# Patient Record
Sex: Female | Born: 1975
Health system: Southern US, Community
[De-identification: ages and names within clinical notes are randomized; demographics above are authoritative.]

## PROBLEM LIST (undated history)

## (undated) ENCOUNTER — Emergency Department (HOSPITAL_COMMUNITY): Admission: EM | Payer: Self-pay | Source: Home / Self Care

## (undated) DIAGNOSIS — O24419 Gestational diabetes mellitus in pregnancy, unspecified control: Secondary | ICD-10-CM

## (undated) DIAGNOSIS — R002 Palpitations: Secondary | ICD-10-CM

## (undated) DIAGNOSIS — J45909 Unspecified asthma, uncomplicated: Secondary | ICD-10-CM

## (undated) DIAGNOSIS — D649 Anemia, unspecified: Secondary | ICD-10-CM

## (undated) DIAGNOSIS — F32A Depression, unspecified: Secondary | ICD-10-CM

## (undated) DIAGNOSIS — T8859XA Other complications of anesthesia, initial encounter: Secondary | ICD-10-CM

## (undated) DIAGNOSIS — J302 Other seasonal allergic rhinitis: Secondary | ICD-10-CM

## (undated) DIAGNOSIS — O1490 Unspecified pre-eclampsia, unspecified trimester: Secondary | ICD-10-CM

## (undated) DIAGNOSIS — R011 Cardiac murmur, unspecified: Secondary | ICD-10-CM

## (undated) DIAGNOSIS — N979 Female infertility, unspecified: Secondary | ICD-10-CM

## (undated) DIAGNOSIS — D219 Benign neoplasm of connective and other soft tissue, unspecified: Secondary | ICD-10-CM

## (undated) DIAGNOSIS — Z8632 Personal history of gestational diabetes: Secondary | ICD-10-CM

## (undated) DIAGNOSIS — O139 Gestational [pregnancy-induced] hypertension without significant proteinuria, unspecified trimester: Secondary | ICD-10-CM

## (undated) HISTORY — DX: Female infertility, unspecified: N97.9

## (undated) HISTORY — DX: Palpitations: R00.2

## (undated) HISTORY — PX: WISDOM TOOTH EXTRACTION: SHX21

## (undated) HISTORY — PX: KNEE SURGERY: SHX244

## (undated) HISTORY — DX: Depression, unspecified: F32.A

---
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
## 1998-07-03 ENCOUNTER — Emergency Department (HOSPITAL_COMMUNITY): Admission: EM | Admit: 1998-07-03 | Discharge: 1998-07-04 | Payer: Self-pay | Admitting: Emergency Medicine

## 1998-10-01 ENCOUNTER — Encounter: Admission: RE | Admit: 1998-10-01 | Discharge: 1998-10-01 | Payer: Self-pay | Admitting: Family Medicine

## 1999-09-14 ENCOUNTER — Emergency Department (HOSPITAL_COMMUNITY): Admission: EM | Admit: 1999-09-14 | Discharge: 1999-09-14 | Payer: Self-pay | Admitting: Emergency Medicine

## 1999-09-22 ENCOUNTER — Encounter: Payer: Self-pay | Admitting: Emergency Medicine

## 1999-09-22 ENCOUNTER — Emergency Department (HOSPITAL_COMMUNITY): Admission: EM | Admit: 1999-09-22 | Discharge: 1999-09-22 | Payer: Self-pay | Admitting: Emergency Medicine

## 2000-07-13 ENCOUNTER — Encounter: Admission: RE | Admit: 2000-07-13 | Discharge: 2000-07-13 | Payer: Self-pay | Admitting: Family Medicine

## 2000-08-25 ENCOUNTER — Encounter: Admission: RE | Admit: 2000-08-25 | Discharge: 2000-08-25 | Payer: Self-pay | Admitting: Family Medicine

## 2001-01-04 ENCOUNTER — Encounter: Payer: Self-pay | Admitting: Emergency Medicine

## 2001-01-04 ENCOUNTER — Emergency Department (HOSPITAL_COMMUNITY): Admission: EM | Admit: 2001-01-04 | Discharge: 2001-01-04 | Payer: Self-pay | Admitting: Emergency Medicine

## 2002-11-28 ENCOUNTER — Encounter: Payer: Self-pay | Admitting: Emergency Medicine

## 2002-11-28 ENCOUNTER — Emergency Department (HOSPITAL_COMMUNITY): Admission: EM | Admit: 2002-11-28 | Discharge: 2002-11-28 | Payer: Self-pay | Admitting: Emergency Medicine

## 2003-12-02 ENCOUNTER — Encounter (INDEPENDENT_AMBULATORY_CARE_PROVIDER_SITE_OTHER): Payer: Self-pay | Admitting: *Deleted

## 2003-12-10 ENCOUNTER — Encounter (INDEPENDENT_AMBULATORY_CARE_PROVIDER_SITE_OTHER): Payer: Self-pay | Admitting: *Deleted

## 2003-12-10 ENCOUNTER — Encounter: Admission: RE | Admit: 2003-12-10 | Discharge: 2003-12-10 | Payer: Self-pay | Admitting: Family Medicine

## 2005-03-04 ENCOUNTER — Ambulatory Visit: Payer: Self-pay | Admitting: Family Medicine

## 2005-08-04 ENCOUNTER — Ambulatory Visit: Payer: Self-pay | Admitting: Family Medicine

## 2005-09-12 ENCOUNTER — Ambulatory Visit: Payer: Self-pay | Admitting: Family Medicine

## 2006-04-07 ENCOUNTER — Ambulatory Visit: Payer: Self-pay | Admitting: Family Medicine

## 2006-08-24 ENCOUNTER — Emergency Department (HOSPITAL_COMMUNITY): Admission: EM | Admit: 2006-08-24 | Discharge: 2006-08-24 | Payer: Self-pay | Admitting: Emergency Medicine

## 2006-09-13 ENCOUNTER — Encounter: Admission: RE | Admit: 2006-09-13 | Discharge: 2006-09-13 | Payer: Self-pay | Admitting: Internal Medicine

## 2006-09-19 ENCOUNTER — Encounter: Admission: RE | Admit: 2006-09-19 | Discharge: 2006-10-19 | Payer: Self-pay | Admitting: Orthopedic Surgery

## 2006-12-28 ENCOUNTER — Ambulatory Visit (HOSPITAL_BASED_OUTPATIENT_CLINIC_OR_DEPARTMENT_OTHER): Admission: RE | Admit: 2006-12-28 | Discharge: 2006-12-28 | Payer: Self-pay | Admitting: Orthopedic Surgery

## 2006-12-29 ENCOUNTER — Encounter (INDEPENDENT_AMBULATORY_CARE_PROVIDER_SITE_OTHER): Payer: Self-pay | Admitting: *Deleted

## 2007-01-09 ENCOUNTER — Encounter: Admission: RE | Admit: 2007-01-09 | Discharge: 2007-03-07 | Payer: Self-pay | Admitting: Orthopedic Surgery

## 2007-08-13 ENCOUNTER — Ambulatory Visit (HOSPITAL_COMMUNITY): Admission: RE | Admit: 2007-08-13 | Discharge: 2007-08-13 | Payer: Self-pay | Admitting: Orthopedic Surgery

## 2008-07-02 ENCOUNTER — Emergency Department (HOSPITAL_COMMUNITY): Admission: EM | Admit: 2008-07-02 | Discharge: 2008-07-02 | Payer: Self-pay | Admitting: Family Medicine

## 2008-08-28 ENCOUNTER — Ambulatory Visit: Payer: Self-pay | Admitting: Family Medicine

## 2008-08-28 DIAGNOSIS — R0609 Other forms of dyspnea: Secondary | ICD-10-CM | POA: Insufficient documentation

## 2008-08-28 DIAGNOSIS — R0989 Other specified symptoms and signs involving the circulatory and respiratory systems: Secondary | ICD-10-CM | POA: Insufficient documentation

## 2008-09-16 ENCOUNTER — Ambulatory Visit: Payer: Self-pay | Admitting: Family Medicine

## 2009-08-21 ENCOUNTER — Encounter: Payer: Self-pay | Admitting: Family Medicine

## 2009-08-21 ENCOUNTER — Ambulatory Visit: Payer: Self-pay | Admitting: Family Medicine

## 2009-08-21 DIAGNOSIS — J309 Allergic rhinitis, unspecified: Secondary | ICD-10-CM | POA: Insufficient documentation

## 2009-08-21 DIAGNOSIS — E669 Obesity, unspecified: Secondary | ICD-10-CM | POA: Insufficient documentation

## 2009-08-26 ENCOUNTER — Encounter: Payer: Self-pay | Admitting: Family Medicine

## 2009-08-31 ENCOUNTER — Telehealth: Payer: Self-pay | Admitting: Family Medicine

## 2009-09-04 ENCOUNTER — Ambulatory Visit: Payer: Self-pay | Admitting: Family Medicine

## 2009-10-15 ENCOUNTER — Ambulatory Visit: Payer: Self-pay | Admitting: Family Medicine

## 2009-10-15 ENCOUNTER — Encounter: Payer: Self-pay | Admitting: Family Medicine

## 2009-10-15 DIAGNOSIS — R87619 Unspecified abnormal cytological findings in specimens from cervix uteri: Secondary | ICD-10-CM | POA: Insufficient documentation

## 2009-10-19 ENCOUNTER — Encounter: Payer: Self-pay | Admitting: Family Medicine

## 2009-11-05 ENCOUNTER — Encounter: Payer: Self-pay | Admitting: Family Medicine

## 2009-11-25 ENCOUNTER — Encounter: Payer: Self-pay | Admitting: Family Medicine

## 2010-01-12 ENCOUNTER — Ambulatory Visit (HOSPITAL_COMMUNITY): Admission: RE | Admit: 2010-01-12 | Discharge: 2010-01-12 | Payer: Self-pay | Admitting: Orthopedic Surgery

## 2010-04-16 ENCOUNTER — Ambulatory Visit (HOSPITAL_BASED_OUTPATIENT_CLINIC_OR_DEPARTMENT_OTHER): Admission: RE | Admit: 2010-04-16 | Discharge: 2010-04-16 | Payer: Self-pay | Admitting: Orthopedic Surgery

## 2010-04-28 ENCOUNTER — Encounter: Payer: Self-pay | Admitting: Family Medicine

## 2010-05-26 ENCOUNTER — Encounter: Payer: Self-pay | Admitting: Family Medicine

## 2010-06-23 ENCOUNTER — Encounter: Payer: Self-pay | Admitting: Family Medicine

## 2010-07-21 ENCOUNTER — Encounter: Payer: Self-pay | Admitting: Family Medicine

## 2010-09-02 ENCOUNTER — Encounter: Payer: Self-pay | Admitting: Family Medicine

## 2010-12-02 NOTE — Consult Note (Signed)
Summary: [facility]   Imported By: Clydell Hakim 05/06/2010 15:32:45  _____________________________________________________________________  External Attachment:    Type:   Image     Comment:   External Document

## 2010-12-02 NOTE — Consult Note (Signed)
Summary: Stacey Carlson   Imported By: De Nurse 09/17/2010 16:56:21  _____________________________________________________________________  External Attachment:    Type:   Image     Comment:   External Document

## 2010-12-02 NOTE — Consult Note (Signed)
Summary: [facility]   Imported By: Bradly Bienenstock 06/02/2010 12:56:40  _____________________________________________________________________  External Attachment:    Type:   Image     Comment:   External Document

## 2010-12-02 NOTE — Consult Note (Signed)
Summary: [facility]   Imported By: Clydell Hakim 11/26/2009 15:43:18  _____________________________________________________________________  External Attachment:    Type:   Image     Comment:   External Document

## 2010-12-02 NOTE — Consult Note (Signed)
[facility]   Imported By: De Nurse 07/13/2010 09:46:02  _____________________________________________________________________  External Attachment:    Type:   Image     Comment:   External Document

## 2010-12-02 NOTE — Consult Note (Signed)
Summary: [facility]   Imported By: Clydell Hakim 11/26/2009 16:04:59  _____________________________________________________________________  External Attachment:    Type:   Image     Comment:   External Document

## 2010-12-02 NOTE — Letter (Signed)
Summary: Repeat Pap 08/2010  [facility]  [address]   [place], [place] [address]   Phone: [phone]  Fax: [phone]    11/05/2009  [facility] [address] [place], [place]  [address]  Dear Ms. MCDOWELL,  This is a follow up to the letter I sent you 2 weeks ago.  While your colposcopy results were normal, because you had atypical glandular cells on your original Pap smear, it is very important that you follow up with a repeat Pap smear in 1 year to ensure that everything is normal.  **  PLEASE SCHEDULE A FOLLOW-UP PAP SMEAR FOR THE FIRST WEEK OF NOVEMBER 2011.  Sincerely, Romero Belling MD  Appended Document: Repeat Pap 08/2010 mailed.

## 2010-12-02 NOTE — Consult Note (Signed)
[facility]   Imported By: Clydell Hakim 07/22/2010 15:05:23  _____________________________________________________________________  External Attachment:    Type:   Image     Comment:   External Document

## 2010-12-15 ENCOUNTER — Encounter: Payer: Self-pay | Admitting: *Deleted

## 2011-03-18 NOTE — Op Note (Signed)
NAME:  Stacey Carlson, Stacey Carlson NO.:  [id number]   MEDICAL RECORD NO.:  [id number]          PATIENT TYPE:  AMB   LOCATION:  [facility]                          FACILITY:  [facility]   PHYSICIAN:  Rodney A. Mortenson, M.D.DATE OF BIRTH:  [birth year]   DATE OF PROCEDURE:  12/28/2006  DATE OF DISCHARGE:                               OPERATIVE REPORT   JUSTIFICATION:  A 35 year old female who fell at work in a hallway on  August 15, 2006.  She struck her right knee.  She has had  some  soreness about the knee since that time.  The pain is felt along the  medial joint line.  Examination shows some tenderness along the medial  joint line, no lateral joint line tenderness, no fluid in the knee.  MRI  of the knee shows no fractures, no effusion, no internal derangement,  some mild chondromalacia of the patella and some edema about the  iliotibial band and otherwise normal.  She was treated conservatively  and never seemed to improve.  On multiple examinations she had a plica  over the medial femoral condyle, which was quite tender.  Manipulating  this caused pain.  The plica was injected, but this was not successful.  Because of persistent pain and discomfort, it was felt that evaluation  of the knee arthroscopically was indicated and necessary.  Complication  discussed preoperatively.  Questions answered and encouraged.  No  guarantee can be given as to the final outcome.  The patient clearly  understands.   PREOPERATIVE DIAGNOSES:  Probable plica, right knee.   POSTOPERATIVE DIAGNOSES:  1. Plica, right knee.  2. Chondral damage, medial femoral condyle right knee (possibly      trauma).   ANESTHESIA:  MAC.   SURGEON:  Lenard Galloway. Chaney Malling, M.D.   PATHOLOGY:  With the arthroscope in the knee, a very careful examination  of the knee was undertaken.  The patellofemoral joint appeared fairly  normal.  There was normal tracking of the patella in the intercondylar  notch, good lateral  compartment was visualized.  Normal articular  cartilage of the lateral femoral condyle.  The lateral tibial plateau  and the entire circumference of the lateral meniscus was normal.  The  Anterior cruciate ligament was normal.  With the arthroscope in the  medial compartment articular cartilage of the weightbearing area of the  medial femoral condyle appeared fairly normal.  Anteriorly over the  medial femoral condyle there was an area of cartilage damage, which  could be related to trauma or even a plica.  Medial meniscus was normal.  The was a small plica present.   PROCEDURE:  The patient was placed on the operating table in supine  position, the pneumatic tourniquet about the right upper thigh.  The  right leg was placed in a leg holder and the entire right lower  extremity prepped with DuraPrep, draped out in the usual manner.  Marcaine placed in the knee and Xylocaine and epinephrine used to  infiltrate the puncture wounds.  An infusion cannula was placed in the  superior medial pouch and knee distended with saline.  Anteromedial and  anterolateral portals were made.  The arthroscope was introduced.  The  findings were described as above.  The only significant pathology was  seen in the medial side of the knee.  A small medial plica was debrided  with a chondroplastic shaver.  There was an area of chondral damage over  the anterior aspect of the medial femoral condyle just above the  anterior horn of the medial meniscus.  This probably was related to  impact trauma but could be related to a plica.  A chondroplastic shaver  was introduced and this area was debrided.  Excellent decompression was  achieved.  There was still good underlying cartilage covering the bone  in the subchondral areas.  The knee was then filled with Marcaine and a  large bulky pressure dressing applied.  The patient returned to the  recovery room in excellent condition.  Technically this procedure went   extremely well.   FOLLOW-UP CARE:  1. To my office on Wednesday.  2. Percocet for pain.  3. Usual postop instructions given.           ______________________________  Lenard Galloway. Chaney Malling, M.D.     RAM/MEDQ  D:  12/28/2006  T:  12/28/2006  Job:  [id number]

## 2012-02-03 ENCOUNTER — Encounter: Payer: Self-pay | Admitting: Family Medicine

## 2012-02-03 ENCOUNTER — Ambulatory Visit (INDEPENDENT_AMBULATORY_CARE_PROVIDER_SITE_OTHER): Payer: 59 | Admitting: Family Medicine

## 2012-02-03 VITALS — BP 135/80 | HR 76 | Temp 97.6°F | Ht 63.0 in | Wt 190.0 lb

## 2012-02-03 DIAGNOSIS — R002 Palpitations: Secondary | ICD-10-CM | POA: Insufficient documentation

## 2012-02-03 DIAGNOSIS — I4949 Other premature depolarization: Secondary | ICD-10-CM

## 2012-02-03 DIAGNOSIS — I493 Ventricular premature depolarization: Secondary | ICD-10-CM | POA: Insufficient documentation

## 2012-02-03 NOTE — Patient Instructions (Signed)
Premature Ventricular Contraction  Premature ventricular contraction (PVC) is an irregularity of the heart rhythm involving extra or skipped heartbeats. In some cases, they may occur without obvious cause or heart disease.  Other times, they can be caused by an electrolyte change in the blood. These need to be corrected. They can also be seen when there is not enough oxygen going to the heart. A common cause of this is plaque or cholesterol buildup. This buildup decreases the blood supply to the heart. In addition, extra beats may be caused or aggravated by:   Excessive smoking.   Alcohol consumption.   Caffeine.   Certain medications   Some street drugs.  SYMPTOMS     The sensation of feeling your heart skipping a beat (palpitations).   In many cases, the person may have no symptoms.  SIGNS AND TESTS     A physical examination may show an occasional irregularity, but if the PVC beats do not happen often, they may not be found on physical exam.   Blood pressure is usually normal.   Other tests that may find extra beats of the heart are:   An EKG (electrocardiogram)   A Holter monitor which can monitor your heart over longer periods of time   An Angiogram (study of the heart arteries).  TREATMENT    Usually extra heartbeats do not need treatment. The condition is treated only if symptoms are severe or if extra beats are very frequent or are causing problems. An underlying cause, if discovered, may also require treatment.    Treatment may also be needed if there may be a risk for other more serious cardiac arrhythmias.    PREVENTION     Moderation in caffeine, alcohol, and tobacco use may reduce the risk of ectopic heartbeats in some people.   Exercise often helps people who lead a sedentary (inactive) lifestyle.  PROGNOSIS    PVC heartbeats are generally harmless and do not need treatment.    RISKS AND COMPLICATIONS     Ventricular tachycardia (occasionally).   There usually are no complications.    Other arrhythmias (occasionally).  SEEK IMMEDIATE MEDICAL CARE IF:     You feel palpitations that are frequent or continual.   You develop chest pain or other problems such as shortness of breath, sweating, or nausea and vomiting.   You become light-headed or faint (pass out).   You get worse or do not improve with treatment.  Document Released: 06/03/2004 Document Revised: 10/06/2011 Document Reviewed: 12/14/2007  [employer] Patient Information 2012 [employer].

## 2012-02-03 NOTE — Assessment & Plan Note (Signed)
Most likely PVC's, don't occur every day. Not overly symptomatic. Will monitor. No cardio referral for Holter at this time. No EKG needed as she is not having palpitations at this time.  No beta-blocker at this time as she is not symptomatic.

## 2012-02-03 NOTE — Progress Notes (Signed)
  Subjective:    Patient ID: Stacey Carlson, female    DOB: Aug 09, 1976, 36 y.o.   MRN: [id number]  HPI 1. Heart fluttering Patient c/o heart palpations that last seconds. Sometimes causes lightheaded-ness. She has had this all her life with increased frequency in the last few months. It still occurs once every now and then, not even weekly. No chest pain, no sob, no syncope. She has no heart history.  Family history: grandmother with heart disease.  Tobacco use: Patient is a non smoker. Advised patient to about the risks of smoking to health. No drugs/alcohol/caffeine Sleeps well. Minimal stress. Stays hydrated.  Review of Systems Pertinent items are noted in HPI.    Objective:   Physical Exam Filed Vitals:   02/03/12 0905  BP: 135/80  Pulse: 76  Temp: 97.6 F (36.4 C)  TempSrc: Oral  Height: 5\' 3"  (1.6 m)  Weight: 190 lb (86.183 kg)  Lungs:  Normal respiratory effort, chest expands symmetrically. Lungs are clear to auscultation, no crackles or wheezes. Heart - Regular rate and rhythm.  No murmurs, gallops or rubs.    Abdomen: soft and non-tender without masses, organomegaly or hernias noted.  No guarding or rebound Extremities:   Non-tender, No cyanosis, edema, or deformity noted. Pulse: radial normal, equal bilaterally. Neck:  No deformities, thyromegaly, masses, or tenderness noted.   Supple with full range of motion without pain.     Assessment & Plan:

## 2012-02-16 ENCOUNTER — Ambulatory Visit (INDEPENDENT_AMBULATORY_CARE_PROVIDER_SITE_OTHER): Payer: 59 | Admitting: Family Medicine

## 2012-02-16 ENCOUNTER — Other Ambulatory Visit (HOSPITAL_COMMUNITY)
Admission: RE | Admit: 2012-02-16 | Discharge: 2012-02-16 | Disposition: A | Discharge status: Home / Self Care | Payer: 59 | Source: Ambulatory Visit | Attending: Family Medicine | Admitting: Family Medicine

## 2012-02-16 ENCOUNTER — Encounter: Payer: Self-pay | Admitting: Family Medicine

## 2012-02-16 VITALS — BP 135/79 | HR 71 | Temp 98.5°F | Ht 63.0 in | Wt 190.0 lb

## 2012-02-16 DIAGNOSIS — Z124 Encounter for screening for malignant neoplasm of cervix: Secondary | ICD-10-CM

## 2012-02-16 DIAGNOSIS — R87619 Unspecified abnormal cytological findings in specimens from cervix uteri: Secondary | ICD-10-CM

## 2012-02-16 DIAGNOSIS — Z0000 Encounter for general adult medical examination without abnormal findings: Secondary | ICD-10-CM

## 2012-02-16 DIAGNOSIS — Z01419 Encounter for gynecological examination (general) (routine) without abnormal findings: Secondary | ICD-10-CM

## 2012-02-16 DIAGNOSIS — E669 Obesity, unspecified: Secondary | ICD-10-CM

## 2012-02-16 DIAGNOSIS — J309 Allergic rhinitis, unspecified: Secondary | ICD-10-CM

## 2012-02-16 NOTE — Patient Instructions (Signed)
I would like you to try to take Zyrtec everyday and look for saline eyedrops and allergy eyedrops We are checking your cholesterol today.  We are also checking your Pap smear today.  I will send you a letter if everything is okay. I will call you if we need to discuss anything

## 2012-02-17 LAB — COMPREHENSIVE METABOLIC PANEL
ALT: 13 U/L (ref 0–35)
CO2: 25 mEq/L (ref 19–32)
Chloride: 101 mEq/L (ref 96–112)
Sodium: 137 mEq/L (ref 135–145)
Total Bilirubin: 0.4 mg/dL (ref 0.3–1.2)
Total Protein: 7.5 g/dL (ref 6.0–8.3)

## 2012-02-17 LAB — LIPID PANEL
Cholesterol: 159 mg/dL (ref 0–200)
VLDL: 17 mg/dL (ref 0–40)

## 2012-02-20 ENCOUNTER — Encounter: Payer: Self-pay | Admitting: Family Medicine

## 2012-02-21 ENCOUNTER — Encounter: Payer: Self-pay | Admitting: Family Medicine

## 2012-02-21 DIAGNOSIS — Z01419 Encounter for gynecological examination (general) (routine) without abnormal findings: Secondary | ICD-10-CM | POA: Insufficient documentation

## 2012-02-21 NOTE — Assessment & Plan Note (Signed)
The colpo and next Pap smear after this were normal. Repeat today

## 2012-02-21 NOTE — Assessment & Plan Note (Signed)
Will have patient try Zyrtec, saline eyedrops, allergy eyedrops to help her with her symptoms. She does have an albuterol inhaler at home in case she gets some asthma.

## 2012-02-21 NOTE — Progress Notes (Signed)
  Subjective:     Stacey Carlson is a 36 y.o. female here for a routine exam.  Current complaints: Allergies. She has itchy eyes. She states that the pattern day she was given does not help. She also notes a runny nose and postnasal drip.   Patient states that she is having normal periods. She does have some mild discharge and itch before periods, but this resolves on its own.  Patient states that she is walking regularly for exercise. She does occasionally get some asthma from walking outside in the pollen. She uses albuterol with good relief intermittently.  Review of Systems Pertinent items are noted in HPI.    Objective:    BP 135/79  Pulse 71  Temp(Src) 98.5 F (36.9 C) (Oral)  Ht 5\' 3"  (1.6 m)  Wt 190 lb (86.183 kg)  BMI 33.66 kg/m2  LMP 01/27/2012  General Appearance:    Alert, cooperative, no distress, appears stated age                        Lungs:     Clear to auscultation bilaterally, respirations unlabored      Heart:    Regular rate and rhythm, S1 and S2 normal, no murmur, rub   or gallop     Abdomen:     Soft, non-tender, bowel sounds active all four quadrants,    no masses, no organomegaly  Genitalia:    Normal female without lesion, discharge or tenderness     Extremities:   Extremities normal, atraumatic, no cyanosis or edema  Pulses:   2+ and symmetric all extremities  Skin:   Skin color, texture, turgor normal, no rashes or lesions            Assessment:    Healthy female exam.    Plan:    Education reviewed: low fat, low cholesterol diet and weight bearing exercise. Follow up in: 3 months.

## 2012-02-25 ENCOUNTER — Encounter: Payer: Self-pay | Admitting: Family Medicine

## 2012-08-06 ENCOUNTER — Emergency Department (HOSPITAL_COMMUNITY)
Admission: EM | Admit: 2012-08-06 | Discharge: 2012-08-06 | Disposition: A | Discharge status: Home / Self Care | Payer: 59 | Attending: Emergency Medicine | Admitting: Emergency Medicine

## 2012-08-06 ENCOUNTER — Encounter (HOSPITAL_COMMUNITY): Payer: Self-pay

## 2012-08-06 DIAGNOSIS — L02419 Cutaneous abscess of limb, unspecified: Secondary | ICD-10-CM | POA: Insufficient documentation

## 2012-08-06 DIAGNOSIS — R011 Cardiac murmur, unspecified: Secondary | ICD-10-CM | POA: Insufficient documentation

## 2012-08-06 DIAGNOSIS — L03119 Cellulitis of unspecified part of limb: Secondary | ICD-10-CM | POA: Insufficient documentation

## 2012-08-06 DIAGNOSIS — L03116 Cellulitis of left lower limb: Secondary | ICD-10-CM

## 2012-08-06 HISTORY — DX: Cardiac murmur, unspecified: R01.1

## 2012-08-06 LAB — CBC WITH DIFFERENTIAL/PLATELET
Eosinophils Absolute: 0.2 10*3/uL (ref 0.0–0.7)
Hemoglobin: 12.9 g/dL (ref 12.0–15.0)
Lymphocytes Relative: 30 % (ref 12–46)
Lymphs Abs: 1.5 10*3/uL (ref 0.7–4.0)
MCH: 30.2 pg (ref 26.0–34.0)
Monocytes Relative: 10 % (ref 3–12)
Neutro Abs: 2.8 10*3/uL (ref 1.7–7.7)
Neutrophils Relative %: 57 % (ref 43–77)
RBC: 4.27 MIL/uL (ref 3.87–5.11)

## 2012-08-06 LAB — BASIC METABOLIC PANEL
CO2: 28 mEq/L (ref 19–32)
Chloride: 103 mEq/L (ref 96–112)
GFR calc Af Amer: >90 mL/min (ref >90)
Potassium: 4 mEq/L (ref 3.5–5.1)

## 2012-08-06 MED ORDER — DIPHENHYDRAMINE HCL 50 MG/ML IJ SOLN
INTRAMUSCULAR | Status: AC
  Administered 2012-08-06: 50 mg
  Filled 2012-08-06: qty 1

## 2012-08-06 MED ORDER — VANCOMYCIN HCL IN DEXTROSE 1-5 GM/200ML-% IV SOLN
1000.0000 mg | Freq: Once | INTRAVENOUS | Status: AC
  Administered 2012-08-06: 1000 mg via INTRAVENOUS
  Filled 2012-08-06: qty 200

## 2012-08-06 MED ORDER — SULFAMETHOXAZOLE-TRIMETHOPRIM 800-160 MG PO TABS: 1.0000 | ORAL_TABLET | Freq: Two times a day (BID) | ORAL | Status: DC

## 2012-08-06 NOTE — ED Provider Notes (Signed)
History     CSN: [id number]  Arrival date & time 08/06/12  1100   First MD Initiated Contact with Patient 08/06/12 1227      Chief Complaint  Patient presents with  . Leg Swelling    (Consider location/radiation/quality/duration/timing/severity/associated sxs/prior treatment) HPI... status post ant bite to left leg approximately 3 days ago. No area erythematous and tender.  No fever, sweats, chills. Severity is mild to moderate. Palpation makes it worse  Past Medical History  Diagnosis Date  . Murmur     Past Surgical History  Procedure Date  . Knee surgery     No family history on file.  History  Substance Use Topics  . Smoking status: Never Smoker   . Smokeless tobacco: Never Used  . Alcohol Use: No    OB History    Grav Para Term Preterm Abortions TAB SAB Ect Mult Living                  Review of Systems  All other systems reviewed and are negative.    Allergies  Review of patient's allergies indicates no known allergies.  Home Medications   Current Outpatient Rx  Name Route Sig Dispense Refill  . DIPHENHYDRAMINE HCL 12.5 MG/5ML PO LIQD Oral Take 25 mg by mouth 4 (four) times daily as needed. For itching    . MELOXICAM 15 MG PO TABS Oral Take 15 mg by mouth daily as needed. For pain    . OLOPATADINE HCL 0.2 % OP SOLN Ophthalmic Apply 1 drop to eye as needed. For allergies    . ALBUTEROL SULFATE HFA 108 (90 BASE) MCG/ACT IN AERS Inhalation Inhale 1-2 puffs into the lungs every 4 (four) hours as needed. And before exercise     . FLUTICASONE PROPIONATE 50 MCG/ACT NA SUSP Nasal 2 sprays by Nasal route daily. Per nostril     . SULFAMETHOXAZOLE-TRIMETHOPRIM 800-160 MG PO TABS Oral Take 1 tablet by mouth every 12 (twelve) hours. 20 tablet 0    BP 139/84  Pulse 75  Temp 98.7 F (37.1 C) (Oral)  Resp 18  SpO2 100%  LMP 07/26/2012  Physical Exam  Nursing note and vitals reviewed. Constitutional: She is oriented to person, place, and time. She appears  well-developed and well-nourished.  HENT:  Head: Normocephalic and atraumatic.  Eyes: Conjunctivae normal and EOM are normal. Pupils are equal, round, and reactive to light.  Neck: Normal range of motion. Neck supple.  Cardiovascular: Normal rate, regular rhythm and normal heart sounds.   Pulmonary/Chest: Effort normal and breath sounds normal.  Abdominal: Soft. Bowel sounds are normal.  Musculoskeletal: Normal range of motion.  Neurological: She is alert and oriented to person, place, and time.  Skin:       Left lower extremity: Area of erythema approximately 20 x 15 cm  left medial knee area.  Psychiatric: She has a normal mood and affect.    ED Course  Procedures (including critical care time)   Labs Reviewed  CBC WITH DIFFERENTIAL  BASIC METABOLIC PANEL   No results found.   1. Cellulitis of left leg       MDM  Patient is nontoxic. IV vancomycin administered. Discharged home with Septra. Patient understands symptoms might worsen        Donnetta Hutching, MD 08/06/12 1531

## 2012-08-06 NOTE — ED Notes (Signed)
Patient reports that she was bit by something 3 days ago and has gotten red and having increased swelling to the left leg.. Patient has streaking  Present to the left leg.

## 2013-11-16 ENCOUNTER — Emergency Department (INDEPENDENT_AMBULATORY_CARE_PROVIDER_SITE_OTHER): Payer: 59

## 2013-11-16 ENCOUNTER — Emergency Department (HOSPITAL_COMMUNITY)
Admission: EM | Admit: 2013-11-16 | Discharge: 2013-11-16 | Disposition: A | Discharge status: Home / Self Care | Payer: 59 | Source: Home / Self Care | Attending: Emergency Medicine | Admitting: Emergency Medicine

## 2013-11-16 ENCOUNTER — Encounter (HOSPITAL_COMMUNITY): Payer: Self-pay | Admitting: Emergency Medicine

## 2013-11-16 DIAGNOSIS — S9030XA Contusion of unspecified foot, initial encounter: Secondary | ICD-10-CM

## 2013-11-16 MED ORDER — HYDROCODONE-ACETAMINOPHEN 5-325 MG PO TABS: ORAL_TABLET | ORAL | Status: DC

## 2013-11-16 NOTE — ED Provider Notes (Signed)
Chief Complaint:   Chief Complaint  Patient presents with  . Foot Injury    History of Present Illness:   Stacey Carlson is a 38 year old female who injured her right foot at home yesterday. She was trying to climb over a table, had stepped on a wooden railing, and the railing gave way. She fell off for 2, landing on her right foot. Ever since then she's had pain and swelling over the fifth metatarsal. It hurts to bear weight and is swollen. She denies any numbness or tingling.  Review of Systems:  Other than noted above, the patient denies any of the following symptoms: Systemic:  No fevers, chills, or sweats.  No fatigue or tiredness. Musculoskeletal:  No joint pain, arthritis, bursitis, swelling, or back pain.  Neurological:  No muscular weakness, paresthesias.  Shirley:  Past medical history, family history, social history, meds, and allergies were reviewed.   Physical Exam:   Vital signs:  BP 136/71  Pulse 72  Temp(Src) 98.1 F (36.7 C) (Oral)  Resp 18  SpO2 100%  LMP 11/06/2013 Gen:  Alert and oriented times 3.  In no distress. Musculoskeletal:  Exam of the foot reveals pain to palpation of the fifth metatarsal. No swelling, bruising, or deformity.  Otherwise, all joints had a full a ROM with no swelling, bruising or deformity.  No edema, pulses full. Extremities were warm and pink.  Capillary refill was brisk.  Skin:  Clear, warm and dry.  No rash. Neuro:  Alert and oriented times 3.  Muscle strength was normal.  Sensation was intact to light touch.   Radiology:  Dg Foot Complete Right  11/16/2013   CLINICAL DATA:  History of fall complaining of foot pain.  EXAM: RIGHT FOOT COMPLETE - 3+ VIEW  COMPARISON:  No priors.  FINDINGS: Multiple views of the right foot demonstrate no acute displaced fracture, subluxation, dislocation, or soft tissue abnormality.  IMPRESSION: No acute radiographic abnormality of the right foot.   Electronically Signed   By: Vinnie Langton M.D.   On:  11/16/2013 13:31   I reviewed the images independently and personally and concur with the radiologist's findings.  Course in Urgent Care Center:   Given a postoperative shoe and crutches.  Assessment:  The encounter diagnosis was Foot contusion.  No evidence of fracture.  Plan:   1.  Meds:  The following meds were prescribed:   Discharge Medication List as of 11/16/2013  2:07 PM    START taking these medications   Details  HYDROcodone-acetaminophen (NORCO/VICODIN) 5-325 MG per tablet 1 to 2 tabs every 4 to 6 hours as needed for pain., Print        2.  Patient Education/Counseling:  The patient was given appropriate handouts, self care instructions, and instructed in symptomatic relief including rest and activity, elevation, application of ice and compression.   3.  Follow up:  The patient was told to follow up if no better in one to 2 weeks, if becoming worse in any way, and given some red flag symptoms such as worsening pain which would prompt immediate return.  Follow up here as needed.       Harden Mo, MD 11/16/13 2113

## 2013-11-16 NOTE — ED Notes (Signed)
Pt c/o right foot inj onset yest around 1200... Reports she was in the garage and stepped on the foot of a table that cracked Pt twisted ankle and top of foot is hurting w/some swelling... Pain increases w/activity Reports she went to work her 8 hour shift Alert w/no signs of acute distress.

## 2013-11-16 NOTE — Discharge Instructions (Signed)
Foot Contusion A foot contusion is a deep bruise to the foot. Contusions are the result of an injury that caused bleeding under the skin. The contusion may turn blue, purple, or yellow. Minor injuries will give you a painless contusion, but more severe contusions may stay painful and swollen for a few weeks. CAUSES  A foot contusion comes from a direct blow to that area, such as a heavy object falling on the foot. SYMPTOMS   Swelling of the foot.  Discoloration of the foot.  Tenderness or soreness of the foot. DIAGNOSIS  You will have a physical exam and will be asked about your history. You may need an X-ray of your foot to look for a broken bone (fracture).  TREATMENT  An elastic wrap may be recommended to support your foot. Resting, elevating, and applying cold compresses to your foot are often the best treatments for a foot contusion. Over-the-counter medicines may also be recommended for pain control. HOME CARE INSTRUCTIONS   Put ice on the injured area.  Put ice in a plastic bag.  Place a towel between your skin and the bag.  Leave the ice on for 15-20 minutes, 03-04 times a day.  Only take over-the-counter or prescription medicines for pain, discomfort, or fever as directed by your caregiver.  If told, use an elastic wrap as directed. This can help reduce swelling. You may remove the wrap for sleeping, showering, and bathing. If your toes become numb, cold, or blue, take the wrap off and reapply it more loosely.  Elevate your foot with pillows to reduce swelling.  Try to avoid standing or walking while the foot is painful. Do not resume use until instructed by your caregiver. Then, begin use gradually. If pain develops, decrease use. Gradually increase activities that do not cause discomfort until you have normal use of your foot.  See your caregiver as directed. It is very important to keep all follow-up appointments in order to avoid any lasting problems with your foot,  including long-term (chronic) pain. SEEK IMMEDIATE MEDICAL CARE IF:   You have increased redness, swelling, or pain in your foot.  Your swelling or pain is not relieved with medicines.  You have loss of feeling in your foot or are unable to move your toes.  Your foot turns cold or blue.  You have pain when you move your toes.  Your foot becomes warm to the touch.  Your contusion does not improve in 2 days. MAKE SURE YOU:   Understand these instructions.  Will watch your condition.  Will get help right away if you are not doing well or get worse. Document Released: 08/08/2006 Document Revised: 04/17/2012 Document Reviewed: 09/20/2011 [facility] Patient Information 2014 [place], [place].

## 2013-12-10 ENCOUNTER — Ambulatory Visit: Payer: 59 | Attending: Orthopedic Surgery | Admitting: Physical Therapy

## 2013-12-10 DIAGNOSIS — M25579 Pain in unspecified ankle and joints of unspecified foot: Secondary | ICD-10-CM | POA: Insufficient documentation

## 2013-12-10 DIAGNOSIS — IMO0001 Reserved for inherently not codable concepts without codable children: Secondary | ICD-10-CM | POA: Insufficient documentation

## 2013-12-20 ENCOUNTER — Ambulatory Visit: Payer: 59 | Admitting: Physical Therapy

## 2013-12-24 ENCOUNTER — Ambulatory Visit: Payer: 59 | Admitting: Physical Therapy

## 2013-12-31 ENCOUNTER — Ambulatory Visit: Payer: 59 | Attending: Orthopedic Surgery | Admitting: Physical Therapy

## 2013-12-31 DIAGNOSIS — M25579 Pain in unspecified ankle and joints of unspecified foot: Secondary | ICD-10-CM | POA: Insufficient documentation

## 2014-01-06 ENCOUNTER — Encounter: Payer: 59 | Admitting: Physical Therapy

## 2014-01-09 ENCOUNTER — Encounter: Payer: Self-pay | Admitting: Family Medicine

## 2014-01-09 DIAGNOSIS — S93401A Sprain of unspecified ligament of right ankle, initial encounter: Secondary | ICD-10-CM | POA: Insufficient documentation

## 2014-09-08 ENCOUNTER — Ambulatory Visit (INDEPENDENT_AMBULATORY_CARE_PROVIDER_SITE_OTHER): Payer: 59 | Admitting: Family Medicine

## 2014-09-08 ENCOUNTER — Encounter: Payer: Self-pay | Admitting: Family Medicine

## 2014-09-08 VITALS — BP 130/75 | HR 72 | Temp 98.3°F | Ht 63.0 in | Wt 197.2 lb

## 2014-09-08 DIAGNOSIS — J208 Acute bronchitis due to other specified organisms: Secondary | ICD-10-CM

## 2014-09-08 DIAGNOSIS — J3089 Other allergic rhinitis: Secondary | ICD-10-CM

## 2014-09-08 MED ORDER — FLUTICASONE PROPIONATE 50 MCG/ACT NA SUSP: 2.0000 | Freq: Every day | NASAL | Status: DC

## 2014-09-08 MED ORDER — BENZONATATE 100 MG PO CAPS: 200.0000 mg | ORAL_CAPSULE | Freq: Three times a day (TID) | ORAL | Status: DC | PRN

## 2014-09-08 MED ORDER — AZITHROMYCIN 250 MG PO TABS
ORAL_TABLET | ORAL | Status: DC
Start: 2014-09-08 — End: 2014-09-30

## 2014-09-08 MED ORDER — OLOPATADINE HCL 0.2 % OP SOLN: 1.0000 [drp] | OPHTHALMIC | Status: DC | PRN

## 2014-09-08 NOTE — Progress Notes (Signed)
  Subjective:     Stacey Carlson is a 38 y.o. female here for evaluation of a cough. Onset of symptoms was 3 weeks ago. Symptoms have been unchanged since that time. The cough is dry and nonproductive and is aggravated by nothing. Associated symptoms include: none. Patient does have a history of asthma. Patient does not have a history of environmental allergens. Patient has not traveled recently. Patient does not have a history of smoking. Patient has not had a previous chest x-ray. Patient has not had a PPD done.  No fever, chills, sweats.  Does have hx of allergic rhinitis but no more medications.  Sx worse at night.    The following portions of the patient's history were reviewed and updated as appropriate: allergies, current medications, past family history, past medical history, past social history, past surgical history and problem list.  Review of Systems Pertinent items are noted in HPI.    Objective:     BP 130/75 mmHg  Pulse 72  Temp(Src) 98.3 F (36.8 C) (Oral)  Ht 5\' 3"  (1.6 m)  Wt 197 lb 3.2 oz (89.449 kg)  BMI 34.94 kg/m2 Head: Normocephalic, without obvious abnormality, atraumatic Eyes: conjunctivae/corneas clear. PERRL, EOM's intact. Fundi benign. Nose: turbinates pink, swollen Throat: lips, mucosa, and tongue normal; teeth and gums normal Neck: no adenopathy Lungs: clear to auscultation bilaterally Heart: regular rate and rhythm    Assessment:    Acute Bronchitis    Plan:  Ongoing for 3 weeks with EIA hx, will tx for possible bacterial origin of bronchitis   Antibiotics per medication orders. Antitussives per medication orders. Avoid exposure to tobacco smoke and fumes. B-agonist inhaler. Call if shortness of breath worsens, blood in sputum, change in character of cough, development of fever or chills, inability to maintain nutrition and hydration. Avoid exposure to tobacco smoke and fumes.

## 2014-09-30 ENCOUNTER — Ambulatory Visit (INDEPENDENT_AMBULATORY_CARE_PROVIDER_SITE_OTHER): Payer: 59 | Admitting: Family Medicine

## 2014-09-30 ENCOUNTER — Encounter: Payer: Self-pay | Admitting: Family Medicine

## 2014-09-30 VITALS — BP 128/78 | HR 74 | Temp 98.1°F | Wt 195.0 lb

## 2014-09-30 DIAGNOSIS — J029 Acute pharyngitis, unspecified: Secondary | ICD-10-CM

## 2014-09-30 LAB — POCT RAPID STREP A (OFFICE): Rapid Strep A Screen: NEGATIVE

## 2014-09-30 MED ORDER — PENICILLIN V POTASSIUM 500 MG PO TABS: 500.0000 mg | ORAL_TABLET | Freq: Two times a day (BID) | ORAL | Status: DC

## 2014-09-30 NOTE — Patient Instructions (Signed)

## 2014-09-30 NOTE — Progress Notes (Signed)
Patient ID: Stacey Carlson, female   DOB: 1975-12-17, 38 y.o.   MRN: [id number]  HPI:  Pt presents for a same day appointment to discuss sore throat.  Son was sick over the weekend and was diagnosed with strep throat. She began to have throat pain yesterday, pain with swallowing. Has tried tea and throat lozenges. Getting worse without relief. Tender in neck and pain with moving head on L side. No cough, runny nose, fever.   ROS: See HPI  [place]: hx obesity, PVC's, chronic heart murmur  PHYSICAL EXAM: BP 128/78 mmHg  Pulse 74  Temp(Src) 98.1 F (36.7 C) (Oral)  Wt 195 lb (88.451 kg) Gen: NAD, pleasant, cooperative HEENT: NCAT. TM's clear bilat. Nares patent. Posterior oropharynx very erythematous without obvious exudate. Moist mucous membranes. Tender anterior cervical lymphadenopathy present Heart: RRR, 2/6 systolic murmur loudest LUSB (chronic per patient) Lungs: CTAB, NWOB Neuro: grossly nonfocal speech normal  ASSESSMENT/PLAN:  1. Streptococcal pharyngitis - rapid strep test negative, but history and exam are convincing for infection. Son tested positive over the weekend. Will treat with oral penicillin VK 500mg  BID x 10 days. Counseled pt that if she gets a yeast infection to call us and let us know, we can send in diflucan for her. F/u as needed if symptoms worsen or do not improve.   FOLLOW UP: F/u as needed if symptoms worsen or do not improve.   [place]inel. Ardelia Mems, [place]

## 2014-12-07 ENCOUNTER — Encounter (HOSPITAL_BASED_OUTPATIENT_CLINIC_OR_DEPARTMENT_OTHER): Payer: Self-pay | Admitting: *Deleted

## 2014-12-07 ENCOUNTER — Emergency Department (HOSPITAL_BASED_OUTPATIENT_CLINIC_OR_DEPARTMENT_OTHER)
Admission: EM | Admit: 2014-12-07 | Discharge: 2014-12-07 | Disposition: A | Discharge status: Home / Self Care | Payer: 59 | Attending: Emergency Medicine | Admitting: Emergency Medicine

## 2014-12-07 DIAGNOSIS — R011 Cardiac murmur, unspecified: Secondary | ICD-10-CM | POA: Insufficient documentation

## 2014-12-07 DIAGNOSIS — Z792 Long term (current) use of antibiotics: Secondary | ICD-10-CM | POA: Diagnosis not present

## 2014-12-07 DIAGNOSIS — N390 Urinary tract infection, site not specified: Secondary | ICD-10-CM | POA: Diagnosis not present

## 2014-12-07 DIAGNOSIS — Z7951 Long term (current) use of inhaled steroids: Secondary | ICD-10-CM | POA: Insufficient documentation

## 2014-12-07 DIAGNOSIS — Z3202 Encounter for pregnancy test, result negative: Secondary | ICD-10-CM | POA: Diagnosis not present

## 2014-12-07 DIAGNOSIS — J45909 Unspecified asthma, uncomplicated: Secondary | ICD-10-CM | POA: Diagnosis not present

## 2014-12-07 DIAGNOSIS — Z79899 Other long term (current) drug therapy: Secondary | ICD-10-CM | POA: Insufficient documentation

## 2014-12-07 DIAGNOSIS — R350 Frequency of micturition: Secondary | ICD-10-CM | POA: Diagnosis present

## 2014-12-07 HISTORY — DX: Unspecified asthma, uncomplicated: J45.909

## 2014-12-07 LAB — URINALYSIS, ROUTINE W REFLEX MICROSCOPIC
Bilirubin Urine: NEGATIVE
Glucose, UA: NEGATIVE mg/dL
KETONES UR: 15 mg/dL — AB
NITRITE: NEGATIVE
PH: 6 (ref 5.0–8.0)
Protein, ur: 100 mg/dL — AB
SPECIFIC GRAVITY, URINE: 1.022 (ref 1.005–1.030)
Urobilinogen, UA: 0.2 mg/dL (ref 0.0–1.0)

## 2014-12-07 LAB — PREGNANCY, URINE: PREG TEST UR: NEGATIVE

## 2014-12-07 LAB — URINE MICROSCOPIC-ADD ON

## 2014-12-07 MED ORDER — NITROFURANTOIN MONOHYD MACRO 100 MG PO CAPS
100.0000 mg | ORAL_CAPSULE | Freq: Two times a day (BID) | ORAL | Status: DC
Start: 2014-12-07 — End: 2016-02-25

## 2014-12-07 NOTE — ED Provider Notes (Signed)
CSN: [id number]     Arrival date & time 12/07/14  0151 History   First MD Initiated Contact with Patient 12/07/14 0204     Chief Complaint  Patient presents with  . urinary symptoms      (Consider location/radiation/quality/duration/timing/severity/associated sxs/prior Treatment) HPI  39 year old female presents with urinary frequency and urgency that started at around 7 PM last night. Patient has not had any abdominal pain or dysuria but feels like she's not completely emptying her bladder. Also noticed hematuria. No back pain, flank pain, nausea, vomiting, or abdominal pain. No fevers. Similar symptoms one month ago and a nurse practitioner prescribed her Keflex which resolved all her symptoms. She did not however urine tested at that time. Patient denies any vaginal bleeding, discharge, or any other symptoms.  Past Medical History  Diagnosis Date  . Murmur   . Asthma    Past Surgical History  Procedure Laterality Date  . Knee surgery     No family history on file. History  Substance Use Topics  . Smoking status: Never Smoker   . Smokeless tobacco: Never Used  . Alcohol Use: No   OB History    No data available     Review of Systems  Constitutional: Negative for fever.  Gastrointestinal: Negative for nausea, vomiting and abdominal pain.  Genitourinary: Positive for urgency, frequency and hematuria. Negative for dysuria, flank pain, vaginal bleeding and vaginal discharge.  Musculoskeletal: Negative for back pain.  All other systems reviewed and are negative.     Allergies  Review of patient's allergies indicates no known allergies.  Home Medications   Prior to Admission medications   Medication Sig Start Date End Date Taking? Authorizing Provider  albuterol (VENTOLIN HFA) 108 (90 BASE) MCG/ACT inhaler Inhale 1-2 puffs into the lungs every 4 (four) hours as needed. And before exercise     Historical Provider, MD  benzonatate (TESSALON) 100 MG capsule Take 2 capsules  (200 mg total) by mouth 3 (three) times daily as needed for cough. 09/08/14   Bryan R Hess, DO  fluticasone (FLONASE) 50 MCG/ACT nasal spray Place 2 sprays into both nostrils daily. Per nostril 09/08/14   Tamela Oddi Hess, DO  Olopatadine HCl (PATADAY) 0.2 % SOLN Apply 1 drop to eye as needed. For allergies 09/08/14   Nolon Rod, DO  penicillin v potassium (VEETID) 500 MG tablet Take 1 tablet (500 mg total) by mouth 2 (two) times daily. For 10 days 09/30/14   Leeanne Rio, MD   BP 142/74 mmHg  Pulse 72  Temp(Src) 97.8 F (36.6 C) (Oral)  Resp 16  Ht 5\' 3"  (1.6 m)  Wt 190 lb (86.183 kg)  BMI 33.67 kg/m2  SpO2 100%  LMP 11/30/2014 (Approximate) Physical Exam  Constitutional: She is oriented to person, place, and time. She appears well-developed and well-nourished.  HENT:  Head: Normocephalic and atraumatic.  Right Ear: External ear normal.  Left Ear: External ear normal.  Nose: Nose normal.  Eyes: Right eye exhibits no discharge. Left eye exhibits no discharge.  Cardiovascular: Normal rate, regular rhythm and normal heart sounds.   Pulmonary/Chest: Effort normal and breath sounds normal.  Abdominal: Soft. She exhibits no distension. There is no tenderness. There is no CVA tenderness.  Neurological: She is alert and oriented to person, place, and time.  Skin: Skin is warm and dry.  Nursing note and vitals reviewed.   ED Course  Procedures (including critical care time) Labs Review Labs Reviewed  URINALYSIS, [place]  MICROSCOPIC - Abnormal; Notable for the following:    APPearance CLOUDY (*)    Hgb urine dipstick LARGE (*)    Ketones, ur 15 (*)    Protein, ur 100 (*)    Leukocytes, UA LARGE (*)    All other components within normal limits  URINE MICROSCOPIC-ADD ON - Abnormal; Notable for the following:    Squamous Epithelial / LPF MANY (*)    Bacteria, UA MANY (*)    All other components within normal limits  URINE CULTURE  PREGNANCY, URINE    Imaging Review No  results found.   EKG Interpretation None      MDM   Final diagnoses:  UTI (lower urinary tract infection)    Patient symptoms are consistent with a UTI. Does have a dirty catch urine but given large amount of leukocytes and symptoms will treat with Macrobid. This appears to be patient's second UTI in about one month. Will refer back to PCP for further testing as needed. No signs of more diastases such as pyelonephritis. Stable for outpatient management at this time. Discussed strict return precautions.    Ephraim Hamburger, MD 12/07/14 [phone]

## 2014-12-07 NOTE — ED Notes (Signed)
C/o urinary frequency that started last night. States urine has been blood tinged. Denies any fevers. States remote hx of UTI one month ago. Denies any back pain

## 2014-12-09 LAB — URINE CULTURE: Colony Count: 15000

## 2014-12-10 ENCOUNTER — Telehealth (HOSPITAL_BASED_OUTPATIENT_CLINIC_OR_DEPARTMENT_OTHER): Payer: Self-pay | Admitting: Emergency Medicine

## 2014-12-10 NOTE — Telephone Encounter (Signed)
Post ED Visit - Positive Culture Follow-up  Culture report reviewed by antimicrobial stewardship pharmacist: []  Wes Dulaney, Pharm.D., BCPS [x]  Heide Guile, Pharm.D., BCPS []  Alycia Rossetti, Pharm.D., BCPS []  [place], Pharm.D., BCPS, AAHIVP []  Legrand Como, Pharm.D., BC                                                                                                                                                                                                                                                                                                                                                        Positive urine culture E. Coli Treated with Nitrofurantoin, organism sensitive to the same and no further patient follow-up is required at this time.  Hazle Nordmann 12/10/2014, 4:49 PM

## 2015-02-23 ENCOUNTER — Ambulatory Visit (INDEPENDENT_AMBULATORY_CARE_PROVIDER_SITE_OTHER): Payer: 59 | Admitting: Family Medicine

## 2015-02-23 ENCOUNTER — Encounter: Payer: Self-pay | Admitting: Family Medicine

## 2015-02-23 ENCOUNTER — Other Ambulatory Visit (HOSPITAL_COMMUNITY)
Admission: RE | Admit: 2015-02-23 | Discharge: 2015-02-23 | Disposition: A | Discharge status: Home / Self Care | Payer: 59 | Source: Ambulatory Visit | Attending: Family Medicine | Admitting: Family Medicine

## 2015-02-23 VITALS — BP 131/76 | HR 65 | Temp 98.4°F | Ht 63.0 in | Wt 186.2 lb

## 2015-02-23 DIAGNOSIS — Z01419 Encounter for gynecological examination (general) (routine) without abnormal findings: Secondary | ICD-10-CM | POA: Diagnosis not present

## 2015-02-23 DIAGNOSIS — R87619 Unspecified abnormal cytological findings in specimens from cervix uteri: Secondary | ICD-10-CM

## 2015-02-23 DIAGNOSIS — R87810 Cervical high risk human papillomavirus (HPV) DNA test positive: Secondary | ICD-10-CM | POA: Insufficient documentation

## 2015-02-23 DIAGNOSIS — Z01411 Encounter for gynecological examination (general) (routine) with abnormal findings: Secondary | ICD-10-CM | POA: Diagnosis not present

## 2015-02-23 DIAGNOSIS — Z1151 Encounter for screening for human papillomavirus (HPV): Secondary | ICD-10-CM | POA: Diagnosis present

## 2015-02-23 NOTE — Progress Notes (Signed)
Patient ID: Stacey Carlson, female   DOB: 01/03/1976, 39 y.o.   MRN: [id number] Subjective:   CC: Pap smear  HPI:   Patient presents just for pap smear today. She declines STD testing. Last pap 02/16/12 was negative. She has had h/o abnormal pap a few years ago for which she had colpo which was normal. LMP 02-07-15.   She would also like to try to become pregnant soon. She worries about her age. She has no h/o pregnancy complications or cardiac issues other than murmur that has never caused issue. She has asthma. With son, had increased BP after delivery.  Review of Systems - Per HPI.   PMH: obesity, allergies, abnormal glandular pap smear, PVC    Objective:  Physical Exam BP 131/76 mmHg  Pulse 65  Temp(Src) 98.4 F (36.9 C) (Oral)  Ht 5\' 3"  (1.6 m)  Wt 186 lb 3 oz (84.454 kg)  BMI 32.99 kg/m2  LMP 02/07/2015 GEN: NAD, pleasant PULM: Normal effort GU: Normal appearing external and internal genitalia, cervix normal appearing except with mild discoloration possible area of prior biopsy, no CMT, uterus and adnexae normal size and mobility    Assessment:     Stacey Carlson is a 39 y.o. female with h/o abnormal pap here for pap smear.    Plan:     # See problem list and after visit summary for problem-specific plans. - Pregnancy discussion: Discussed increased risk due to age but that this did not preclude many pregnancies at this age from being very healthy. Would likely manage at [facility]. H/o elevated BP after delivery would also be monitored. Discussed rule of thirds with asthma control during pregnancy.  # Health Maintenance: pap smear today with cotesting  Follow-up: Follow up PRN.   Hilton Sinclair, MD [place]

## 2015-02-23 NOTE — Assessment & Plan Note (Signed)
Prior pap smear 02/16/12 normal, a prior pap before that was abnormal but with follow up normal colpo and subsequent pap. - Pap smear with cotesting today.

## 2015-02-25 LAB — CYTOLOGY - PAP

## 2015-02-26 ENCOUNTER — Telehealth: Payer: Self-pay | Admitting: Family Medicine

## 2015-02-26 NOTE — Telephone Encounter (Addendum)
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 Answered many questions about etiology of this and next steps along with her desire to get pregnant in about 1 year. Discussed that if biopsy were needed and patient were pregnant, would hold off on this until after pregnancy. However, she states that would only be in 1 year and she has low risk of pregnancy at this time. Let her know that if any further questions, she can call or ask when she comes in for colposcopy clinic. Asked her to call and make appt for this. Also informed her I would be away at a conference for next few days but she could always call in if she had more questions. She voiced understanding.  Hilton Sinclair, MD

## 2015-03-19 ENCOUNTER — Ambulatory Visit (INDEPENDENT_AMBULATORY_CARE_PROVIDER_SITE_OTHER): Payer: 59 | Admitting: Family Medicine

## 2015-03-19 ENCOUNTER — Encounter: Payer: Self-pay | Admitting: Family Medicine

## 2015-03-19 VITALS — BP 135/72 | HR 57 | Temp 98.0°F | Ht 63.0 in | Wt 187.5 lb

## 2015-03-19 DIAGNOSIS — R87619 Unspecified abnormal cytological findings in specimens from cervix uteri: Secondary | ICD-10-CM | POA: Diagnosis not present

## 2015-03-19 DIAGNOSIS — R896 Abnormal cytological findings in specimens from other organs, systems and tissues: Secondary | ICD-10-CM

## 2015-03-19 DIAGNOSIS — IMO0002 Reserved for concepts with insufficient information to code with codable children: Secondary | ICD-10-CM

## 2015-03-19 NOTE — Patient Instructions (Signed)
It was nice to meet you.  We performed a colposcopy today and did NOT see anything abnormal, therefore we did not take any samples You may have some black/brown vaginal discharge from the solution we used. Please follow up with your PCP as directed.

## 2015-03-19 NOTE — Progress Notes (Signed)
Patient here for colposcopy. 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 She is asymptomatic.  PERTINENT  PMH / PSH: I have reviewed the patient's medications, allergies, past medical and surgical history. Pertinent findings that relate to today's visit / issues include: October 2010 pap:**GLANDULAR CELL ABNORMALITY: Atypical glandular cells, see comment.** RECOMMENDATION(S): At the request of the clinician, the remainder of the specimen will be processed for HPV typing. RESULT NEGATIVE for hi risk HPV COMMENT A reactive process is favored. Clinical correlation is recommended.  December 2010  colposcopy pap was repeated and was NORMAL  PROCEDURE NOTE: Patient given informed consent, signed copy in the chart.  Placed in lithotomy position. Cervix viewed with speculum and colposcope after application of acetic acid.   Colposcopy adequate (entire squamocolumnar junctions seen  in entirety) ?  Yes Acetowhite lesions?No Punctation?No Mosaicism?  No Abnormal vasculature?  No Biopsies?None ECC?No Complications? No  COMMENTS: Patient was given post procedure instructions.    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 Colposcopy for AGUS was negative. Evidently she has had no paps in interim.. Given clinically normal colposcopy today with her PMH,  I would recommend repeat Pap smear with COTEST next year. She has been informed of this today at her visit and we discussed.

## 2015-07-13 ENCOUNTER — Encounter: Payer: Self-pay | Admitting: Obstetrics and Gynecology

## 2015-07-13 ENCOUNTER — Ambulatory Visit (INDEPENDENT_AMBULATORY_CARE_PROVIDER_SITE_OTHER): Payer: 59 | Admitting: Obstetrics and Gynecology

## 2015-07-13 VITALS — BP 124/82 | HR 72 | Temp 98.0°F | Ht 63.0 in | Wt 186.6 lb

## 2015-07-13 DIAGNOSIS — Z01419 Encounter for gynecological examination (general) (routine) without abnormal findings: Secondary | ICD-10-CM

## 2015-07-13 DIAGNOSIS — Z0000 Encounter for general adult medical examination without abnormal findings: Secondary | ICD-10-CM

## 2015-07-13 DIAGNOSIS — Z3189 Encounter for other procreative management: Secondary | ICD-10-CM

## 2015-07-13 NOTE — Patient Instructions (Addendum)
Referral placed for fertility counseling; someone will contact you via phone  Go ahead and start taking prenatal vitamins and working on your health with exercise and dieting  He declined flu vaccine today is important that she get the flu vaccine; highly recommended  Health Maintenance Adopting a healthy lifestyle and getting preventive care can go a long way to promote health and wellness. Talk with your health care provider about what schedule of regular examinations is right for you. This is a good chance for you to check in with your provider about disease prevention and staying healthy. In between checkups, there are plenty of things you can do on your own. Experts have done a lot of research about which lifestyle changes and preventive measures are most likely to keep you healthy. Ask your health care provider for more information. WEIGHT AND DIET  Eat a healthy diet  Be sure to include plenty of vegetables, fruits, low-fat dairy products, and lean protein.  Do not eat a lot of foods high in solid fats, added sugars, or salt.  Get regular exercise. This is one of the most important things you can do for your health.  Most adults should exercise for at least 150 minutes each week. The exercise should increase your heart rate and make you sweat (moderate-intensity exercise).  Most adults should also do strengthening exercises at least twice a week. This is in addition to the moderate-intensity exercise.  Maintain a healthy weight  Body mass index (BMI) is a measurement that can be used to identify possible weight problems. It estimates body fat based on height and weight. Your health care provider can help determine your BMI and help you achieve or maintain a healthy weight.  For females 72 years of age and older:   A BMI below 18.5 is considered underweight.  A BMI of 18.5 to 24.9 is normal.  A BMI of 25 to 29.9 is considered overweight.  A BMI of 30 and above is considered  obese.  Watch levels of cholesterol and blood lipids  You should start having your blood tested for lipids and cholesterol at 41 years of age, then have this test every 5 years.  You may need to have your cholesterol levels checked more often if:  Your lipid or cholesterol levels are high.  You are older than 41 years of age.  You are at high risk for heart disease.  CANCER SCREENING   Lung Cancer  Lung cancer screening is recommended for adults 37-14 years old who are at high risk for lung cancer because of a history of smoking.  A yearly low-dose CT scan of the lungs is recommended for people who:  Currently smoke.  Have quit within the past 15 years.  Have at least a 30-pack-year history of smoking. A pack year is smoking an average of one pack of cigarettes a day for 1 year.  Yearly screening should continue until it has been 15 years since you quit.  Yearly screening should stop if you develop a health problem that would prevent you from having lung cancer treatment.  Breast Cancer  Practice breast self-awareness. This means understanding how your breasts normally appear and feel.  It also means doing regular breast self-exams. Let your health care provider know about any changes, no matter how small.  If you are in your 20s or 30s, you should have a clinical breast exam (CBE) by a health care provider every 1-3 years as part of a regular  health exam.  If you are 40 or older, have a CBE every year. Also consider having a breast X-ray (mammogram) every year.  If you have a family history of breast cancer, talk to your health care provider about genetic screening.  If you are at high risk for breast cancer, talk to your health care provider about having an MRI and a mammogram every year.  Breast cancer gene (BRCA) assessment is recommended for women who have family members with BRCA-related cancers. BRCA-related cancers  include:  Breast.  Ovarian.  Tubal.  Peritoneal cancers.  Results of the assessment will determine the need for genetic counseling and BRCA1 and BRCA2 testing. Cervical Cancer Routine pelvic examinations to screen for cervical cancer are no longer recommended for nonpregnant women who are considered low risk for cancer of the pelvic organs (ovaries, uterus, and vagina) and who do not have symptoms. A pelvic examination may be necessary if you have symptoms including those associated with pelvic infections. Ask your health care provider if a screening pelvic exam is right for you.   The Pap test is the screening test for cervical cancer for women who are considered at risk.  If you had a hysterectomy for a problem that was not cancer or a condition that could lead to cancer, then you no longer need Pap tests.  If you are older than 65 years, and you have had normal Pap tests for the past 10 years, you no longer need to have Pap tests.  If you have had past treatment for cervical cancer or a condition that could lead to cancer, you need Pap tests and screening for cancer for at least 20 years after your treatment.  If you no longer get a Pap test, assess your risk factors if they change (such as having a new sexual partner). This can affect whether you should start being screened again.  Some women have medical problems that increase their chance of getting cervical cancer. If this is the case for you, your health care provider may recommend more frequent screening and Pap tests.  The human papillomavirus (HPV) test is another test that may be used for cervical cancer screening. The HPV test looks for the virus that can cause cell changes in the cervix. The cells collected during the Pap test can be tested for HPV.  The HPV test can be used to screen women 52 years of age and older. Getting tested for HPV can extend the interval between normal Pap tests from three to five years.  An HPV  test also should be used to screen women of any age who have unclear Pap test results.  After 41 years of age, women should have HPV testing as often as Pap tests.  Colorectal Cancer  This type of cancer can be detected and often prevented.  Routine colorectal cancer screening usually begins at 41 years of age and continues through 41 years of age.  Your health care provider may recommend screening at an earlier age if you have risk factors for colon cancer.  Your health care provider may also recommend using home test kits to check for hidden blood in the stool.  A small camera at the end of a tube can be used to examine your colon directly (sigmoidoscopy or colonoscopy). This is done to check for the earliest forms of colorectal cancer.  Routine screening usually begins at age 60.  Direct examination of the colon should be repeated every 5-10 years through 41 years  of age. However, you may need to be screened more often if early forms of precancerous polyps or small growths are found. Skin Cancer  Check your skin from head to toe regularly.  Tell your health care provider about any new moles or changes in moles, especially if there is a change in a mole's shape or color.  Also tell your health care provider if you have a mole that is larger than the size of a pencil eraser.  Always use sunscreen. Apply sunscreen liberally and repeatedly throughout the day.  Protect yourself by wearing long sleeves, pants, a wide-brimmed hat, and sunglasses whenever you are outside. HEART DISEASE, DIABETES, AND HIGH BLOOD PRESSURE   Have your blood pressure checked at least every 1-2 years. High blood pressure causes heart disease and increases the risk of stroke.  If you are between 2 years and 36 years old, ask your health care provider if you should take aspirin to prevent strokes.  Have regular diabetes screenings. This involves taking a blood sample to check your fasting blood sugar  level.  If you are at a normal weight and have a low risk for diabetes, have this test once every three years after 41 years of age.  If you are overweight and have a high risk for diabetes, consider being tested at a younger age or more often. PREVENTING INFECTION  Hepatitis B  If you have a higher risk for hepatitis B, you should be screened for this virus. You are considered at high risk for hepatitis B if:  You were born in a country where hepatitis B is common. Ask your health care provider which countries are considered high risk.  Your parents were born in a high-risk country, and you have not been immunized against hepatitis B (hepatitis B vaccine).  You have HIV or AIDS.  You use needles to inject street drugs.  You live with someone who has hepatitis B.  You have had sex with someone who has hepatitis B.  You get hemodialysis treatment.  You take certain medicines for conditions, including cancer, organ transplantation, and autoimmune conditions. Hepatitis C  Blood testing is recommended for:  Everyone born from [birth year] through 1967.  Anyone with known risk factors for hepatitis C. Sexually transmitted infections (STIs)  You should be screened for sexually transmitted infections (STIs) including gonorrhea and chlamydia if:  You are sexually active and are younger than 41 years of age.  You are older than 41 years of age and your health care provider tells you that you are at risk for this type of infection.  Your sexual activity has changed since you were last screened and you are at an increased risk for chlamydia or gonorrhea. Ask your health care provider if you are at risk.  If you do not have HIV, but are at risk, it may be recommended that you take a prescription medicine daily to prevent HIV infection. This is called pre-exposure prophylaxis (PrEP). You are considered at risk if:  You are sexually active and do not regularly use condoms or know the HIV status  of your partner(s).  You take drugs by injection.  You are sexually active with a partner who has HIV. Talk with your health care provider about whether you are at high risk of being infected with HIV. If you choose to begin PrEP, you should first be tested for HIV. You should then be tested every 3 months for as long as you are taking PrEP.  PREGNANCY  If you are premenopausal and you may become pregnant, ask your health care provider about preconception counseling.  If you may become pregnant, take 400 to 800 micrograms (mcg) of folic acid every day.  If you want to prevent pregnancy, talk to your health care provider about birth control (contraception). OSTEOPOROSIS AND MENOPAUSE   Osteoporosis is a disease in which the bones lose minerals and strength with aging. This can result in serious bone fractures. Your risk for osteoporosis can be identified using a bone density scan.  If you are 55 years of age or older, or if you are at risk for osteoporosis and fractures, ask your health care provider if you should be screened.  Ask your health care provider whether you should take a calcium or vitamin D supplement to lower your risk for osteoporosis.  Menopause may have certain physical symptoms and risks.  Hormone replacement therapy may reduce some of these symptoms and risks. Talk to your health care provider about whether hormone replacement therapy is right for you.  HOME CARE INSTRUCTIONS   Schedule regular health, dental, and eye exams.  Stay current with your immunizations.   Do not use any tobacco products including cigarettes, chewing tobacco, or electronic cigarettes.  If you are pregnant, do not drink alcohol.  If you are breastfeeding, limit how much and how often you drink alcohol.  Limit alcohol intake to no more than 1 drink per day for nonpregnant women. One drink equals 12 ounces of beer, 5 ounces of wine, or 1 ounces of hard liquor.  Do not use street  drugs.  Do not share needles.  Ask your health care provider for help if you need support or information about quitting drugs.  Tell your health care provider if you often feel depressed.  Tell your health care provider if you have ever been abused or do not feel safe at home. Document Released: 05/02/2011 Document Revised: 03/03/2014 Document Reviewed: 09/18/2013 [facility] Patient Information 2015 [place], [place]. This information is not intended to replace advice given to you by your health care provider. Make sure you discuss any questions you have with your health care provider.

## 2015-07-13 NOTE — Assessment & Plan Note (Addendum)
Healthy female with no acute concerns. Pap smear performed earlier this year with some abnormalities. Recommendation was to repeat testing in a year. Encouraged patient to eat a healthier diet and increase activity level. She declined flu vaccine today.

## 2015-07-13 NOTE — Progress Notes (Signed)
39 y.o. year old female presents for well woman/preventative visit.  Acute Concerns:  #Fertility concerns - Patient wants to know what she can do to increase her chances of becoming pregnant. Her and her fianc are trying to conceive. She states that she knows that with her AMA that conceiving may be more difficult. They have been having unprotected intercourse for about 6 months. They are not timing ovulation and it is more sporadic.Last menstraul period was 07/01/2005. Patient states that she has regular menstrual cycles every month with normal bleeding. States that menstrual cycles just became normal about a year ago and she used to have irregular bleeding with clots and heavy bleeding.  OB History  Gravida Para Term Preterm AB SAB TAB Ectopic Multiple Living  1 1 1            # Outcome Date GA Lbr Len/2nd Weight Sex Delivery Anes PTL Lv  1 Term 4    M Vag-Spont        Diet: Describes it as horrible.   Exercise: No exercise in the last 30 days. Does have gym membership.  Sexual/Birth History:  Sexually active with fianc; of note, fianc is about 20 yrs younger than patient.   Birth Control: No birth control; trying to conceive  POA/Living Will: No  Social:  Social History   Social History  . Marital Status: Single    Spouse Name: N/A  . Number of Children: N/A  . Years of Education: N/A   Social History Main Topics  . Smoking status: Never Smoker   . Smokeless tobacco: Never Used  . Alcohol Use: No  . Drug Use: No  . Sexual Activity: No   Other Topics Concern  . None   Social History Narrative    Immunization:  Tdap/TD: Up to date  Influenza: Due; patient declining  Cancer Screening:  Pap Smear: Up to date   Physical Exam: BP 124/82 mmHg  Pulse 72  Temp(Src) 98 F (36.7 C) (Oral)  Ht 5\' 3"  (1.6 m)  Wt 186 lb 9 oz (84.624 kg)  BMI 33.06 kg/m2  LMP 07/02/2015  General: alert, well-developed, NAD, cooperative HEENT: NCAT, vision grossly intact,  PERRLA, no injection and anicteric. EOMI. MMM, oral mucosa and oropharynx reveal no lesions or exudates. External ear exam reveals no significant lesions or deformities. Neck: supple, full ROM, no thyromegaly. No deformities, masses, or tenderness noted.  Lungs: CTAB, normal respiratory effort, no crackles, and no wheezes. Heart: RRR, no M/R/G.  Abdomen: Bowel sounds normal; abdomen soft and nontender. No masses, organomegaly or hernias noted.  Pulses: intact Extremities: No edema Neurologic: No focal deficits, +5 strength globally, sensation grossly intact, gait normal, A&Ox3.  Skin: Intact without suspicious lesions or rashes. Warm and dry. Psych: Mood and affect are normal; no evidence of anxiety or depression.   ASSESSMENT & PLAN: 39 y.o. female presents for annual well woman/preventative exam.   Well woman exam Healthy female with no acute concerns. Pap smear performed earlier this year with some abnormalities. Recommendation was to repeat testing in a year. Encouraged patient to eat a healthier diet and increase activity level. She declined flu vaccine today.   Encounter for fertility planning Patient with no know history of fertility issues but due to [place] age she has decreased fertility. Will place referral for fertility counseling and work-up for patient. Has been unsuccessful thus far with unprotected intercourse. Discussed with patient that she should start monitoring and timing her ovulation, need to take prenatal vitamins, and  to have healthy lifestyle to all improve chances of conception.    Orders Placed This Encounter  Procedures  . Ambulatory referral to Obstetrics / Gynecology    Referral Priority:  Routine    Referral Type:  Consultation    Referral Reason:  Specialty Services Required    Requested Specialty:  Obstetrics and Gynecology    Number of Visits Requested:  [place], [place] 07/13/2015, 2:04 PM PGY-2, [facility]

## 2015-07-13 NOTE — Assessment & Plan Note (Signed)
Patient with no know history of fertility issues but due to [place] age she has decreased fertility. Will place referral for fertility counseling and work-up for patient. Has been unsuccessful thus far with unprotected intercourse. Discussed with patient that she should start monitoring and timing her ovulation, need to take prenatal vitamins, and to have healthy lifestyle to all improve chances of conception.

## 2015-10-12 ENCOUNTER — Encounter: Payer: Self-pay | Admitting: Obstetrics and Gynecology

## 2015-11-03 ENCOUNTER — Other Ambulatory Visit: Payer: Self-pay | Admitting: Obstetrics and Gynecology

## 2015-11-03 DIAGNOSIS — J3089 Other allergic rhinitis: Secondary | ICD-10-CM

## 2015-11-03 MED ORDER — ALBUTEROL SULFATE HFA 108 (90 BASE) MCG/ACT IN AERS: 1.0000 | INHALATION_SPRAY | RESPIRATORY_TRACT | Status: DC | PRN

## 2015-11-03 MED ORDER — FLUTICASONE PROPIONATE 50 MCG/ACT NA SUSP: 2.0000 | Freq: Every day | NASAL | Status: DC

## 2015-11-03 MED ORDER — OLOPATADINE HCL 0.2 % OP SOLN: 1.0000 [drp] | OPHTHALMIC | Status: DC | PRN

## 2015-11-03 MED FILL — FLUTICASONE PROP 50 MCG SPR: 50 | 30 days supply | Qty: 16 | Fill #0

## 2015-11-03 MED FILL — PATADAY 0.2% EYE DROPS: 0.2 | 25 days supply | Qty: 3 | Fill #0

## 2015-11-03 MED FILL — VENTOLIN HFA 90 MCG INHALER: 108 (90 BAS | 33 days supply | Qty: 36 | Fill #0

## 2015-11-03 NOTE — Telephone Encounter (Signed)
Needs refill on sinus med--padaday, flonase and ventoliln for inhaler outpt at [facility]

## 2015-12-30 DIAGNOSIS — Z319 Encounter for procreative management, unspecified: Secondary | ICD-10-CM | POA: Diagnosis not present

## 2015-12-30 DIAGNOSIS — Z31430 Encounter of female for testing for genetic disease carrier status for procreative management: Secondary | ICD-10-CM | POA: Diagnosis not present

## 2015-12-30 DIAGNOSIS — E288 Other ovarian dysfunction: Secondary | ICD-10-CM | POA: Diagnosis not present

## 2015-12-30 DIAGNOSIS — D259 Leiomyoma of uterus, unspecified: Secondary | ICD-10-CM | POA: Diagnosis not present

## 2015-12-30 DIAGNOSIS — Z130 Encounter for screening for diseases of the blood and blood-forming organs and certain disorders involving the immune mechanism: Secondary | ICD-10-CM | POA: Diagnosis not present

## 2015-12-30 DIAGNOSIS — Z13228 Encounter for screening for other metabolic disorders: Secondary | ICD-10-CM | POA: Diagnosis not present

## 2015-12-30 DIAGNOSIS — Z3161 Procreative counseling and advice using natural family planning: Secondary | ICD-10-CM | POA: Diagnosis not present

## 2015-12-30 MED FILL — LETROZOLE 2.5 MG TABLET: 2.5 | 5 days supply | Qty: 15 | Fill #0

## 2016-01-01 DIAGNOSIS — Z319 Encounter for procreative management, unspecified: Secondary | ICD-10-CM | POA: Diagnosis not present

## 2016-01-21 DIAGNOSIS — D251 Intramural leiomyoma of uterus: Secondary | ICD-10-CM | POA: Diagnosis not present

## 2016-01-21 DIAGNOSIS — Z319 Encounter for procreative management, unspecified: Secondary | ICD-10-CM | POA: Diagnosis not present

## 2016-01-21 DIAGNOSIS — E222 Syndrome of inappropriate secretion of antidiuretic hormone: Secondary | ICD-10-CM | POA: Diagnosis not present

## 2016-01-21 DIAGNOSIS — D250 Submucous leiomyoma of uterus: Secondary | ICD-10-CM | POA: Diagnosis not present

## 2016-02-11 DIAGNOSIS — H5213 Myopia, bilateral: Secondary | ICD-10-CM | POA: Diagnosis not present

## 2016-02-11 DIAGNOSIS — H52223 Regular astigmatism, bilateral: Secondary | ICD-10-CM | POA: Diagnosis not present

## 2016-02-11 NOTE — H&P (Signed)
Stacey Carlson is a 40 y.o. female , originally referred to me by Dr. Delice Lesch, for secondary infertility, with incidental finding of large uterine myomas. Recent HSG showed bilateral tubal patency and enlarged, T-shaped uterine cavity (likely due to 5-6 sizeable myomas). Fundal adenomyosis was noted.  I recommended L/S, RAM. Patient would like to preserve her childbearing potential.  Pertinent Gynecological History: Menses: Normal Bleeding: Normal Contraception: none DES exposure: denies Blood transfusions: none Sexually transmitted diseases: no past history Previous GYN Procedures: None  Last mammogram: normal Last pap: normal  OB History: G1P1   Menstrual History: Menarche age: 31 No LMP recorded.    Past Medical History  Diagnosis Date  . Murmur   . Asthma                     Past Surgical History  Procedure Laterality Date  . Knee surgery               No family history on file. No hereditary disease.  No cancer of breast, ovary, uterus. No cutaneous leiomyomatosis or renal cell carcinoma.  Social History   Social History  . Marital Status: Single    Spouse Name: N/A  . Number of Children: N/A  . Years of Education: N/A   Occupational History  . Not on file.   Social History Main Topics  . Smoking status: Never Smoker   . Smokeless tobacco: Never Used  . Alcohol Use: No  . Drug Use: No  . Sexual Activity: No   Other Topics Concern  . Not on file   Social History Narrative    No Known Allergies  No current facility-administered medications on file prior to encounter.   Current Outpatient Prescriptions on File Prior to Encounter  Medication Sig Dispense Refill  . albuterol (VENTOLIN HFA) 108 (90 Base) MCG/ACT inhaler Inhale 1-2 puffs into the lungs every 4 (four) hours as needed. And before exercise 2 Inhaler 1  . benzonatate (TESSALON) 100 MG capsule Take 2 capsules (200 mg total) by mouth 3 (three) times daily as needed for cough. 60  capsule 1  . fluticasone (FLONASE) 50 MCG/ACT nasal spray Place 2 sprays into both nostrils daily. Per nostril 16 g 11  . nitrofurantoin, macrocrystal-monohydrate, (MACROBID) 100 MG capsule Take 1 capsule (100 mg total) by mouth 2 (two) times daily. X 7 days 14 capsule 0  . Olopatadine HCl (PATADAY) 0.2 % SOLN Apply 1 drop to eye as needed. For allergies 2.5 mL 11  . penicillin v potassium (VEETID) 500 MG tablet Take 1 tablet (500 mg total) by mouth 2 (two) times daily. For 10 days 20 tablet 0     Review of Systems  Constitutional: Negative.   HENT: Negative.   Eyes: Negative.   Respiratory: Negative.   Cardiovascular: Negative.   Gastrointestinal: Negative.   Genitourinary: Negative.   Musculoskeletal: Negative.   Skin: Negative.   Neurological: Negative.   Endo/Heme/Allergies: Negative.   Psychiatric/Behavioral: Negative.      Physical Exam  There were no vitals taken for this visit. Constitutional: She is oriented to person, place, and time. She appears well-developed and well-nourished.  HENT:  Head: Normocephalic and atraumatic.  Nose: Nose normal.  Mouth/Throat: Oropharynx is clear and moist. No oropharyngeal exudate.  Eyes: Conjunctivae normal and EOM are normal. Pupils are equal, round, and reactive to light. No scleral icterus.  Neck: Normal range of motion. Neck supple. No tracheal deviation present. No thyromegaly present.  Cardiovascular: Normal  rate.   Respiratory: Effort normal and breath sounds normal.  GI: Soft. Bowel sounds are normal. She exhibits no distension and no mass. There is no tenderness.  Lymphadenopathy:    She has no cervical adenopathy.  Neurological: She is alert and oriented to person, place, and time. She has normal reflexes.  Skin: Skin is warm.  Psychiatric: She has a normal mood and affect. Her behavior is normal. Judgment and thought content normal.       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  Right Lateral IM  8.1x 7.4x 7.8 2. Fundal IM  4.4x 3.3 3. Posterior IM  1.5x1.6 4. Anterior IM  4.0x 3.3 Preoperative for robot assisted myomectomy Benefits and risks of robotic myomectomy were discussed with the patient and her family member again.  Bowel prep instructions were given.  All of patient's questions were answered.  She verbalized understanding.  She knows that she will need a cesarean delivery for future pregnancies, and that it is recommended she does not conceive for 2-3 months for uterus to heal.

## 2016-03-01 NOTE — Patient Instructions (Addendum)
Your procedure is scheduled on:  Wednesday, Mar 16, 2016  Enter through the [employer] of [facility] at: 10:30 AM  Pick up the phone at the desk and dial [phone].  Call this number if you have problems the morning of surgery: [phone].  Remember:  Do NOT eat food or drink after:  Midnight Tuesday, Mar 15, 2016  Take these medicines the morning of surgery with a SIP OF WATER:  None.  Bring albuterol inhaler with you on day of surgery.    Do NOT wear jewelry (body piercing), metal hair clips/bobby pins, make-up, or nail polish. Do NOT wear lotions, powders, or perfumes.  You may wear deodorant. Do NOT shave for 48 hours prior to surgery. Do NOT bring valuables to the hospital. Contacts may not be worn into surgery.  Have a responsible adult drive you home and stay with you for 24 hours after your procedure.  Home with mother Stacey Carlson [phone] or fiance Stacey Carlson Carlson [phone]

## 2016-03-03 ENCOUNTER — Encounter (HOSPITAL_COMMUNITY): Payer: Self-pay

## 2016-03-03 ENCOUNTER — Encounter (HOSPITAL_COMMUNITY)
Admission: RE | Admit: 2016-03-03 | Discharge: 2016-03-03 | Disposition: A | Discharge status: Home / Self Care | Payer: 59 | Source: Ambulatory Visit | Attending: Obstetrics and Gynecology | Admitting: Obstetrics and Gynecology

## 2016-03-03 DIAGNOSIS — D259 Leiomyoma of uterus, unspecified: Secondary | ICD-10-CM | POA: Diagnosis not present

## 2016-03-03 DIAGNOSIS — Z01812 Encounter for preprocedural laboratory examination: Secondary | ICD-10-CM | POA: Insufficient documentation

## 2016-03-03 HISTORY — DX: Other seasonal allergic rhinitis: J30.2

## 2016-03-03 HISTORY — DX: Benign neoplasm of connective and other soft tissue, unspecified: D21.9

## 2016-03-03 LAB — CBC
HEMATOCRIT: 37.8 % (ref 36.0–46.0)
HEMOGLOBIN: 13 g/dL (ref 12.0–15.0)
MCH: 31.4 pg (ref 26.0–34.0)
MCHC: 34.4 g/dL (ref 30.0–36.0)
MCV: 91.3 fL (ref 78.0–100.0)
Platelets: 241 10*3/uL (ref 150–400)
RBC: 4.14 MIL/uL (ref 3.87–5.11)
RDW: 12.6 % (ref 11.5–15.5)
WBC: 3.9 10*3/uL — ABNORMAL LOW (ref 4.0–10.5)

## 2016-03-03 LAB — TYPE AND SCREEN
ABO/RH(D): AB POS
Antibody Screen: NEGATIVE

## 2016-03-03 LAB — ABO/RH: ABO/RH(D): AB POS

## 2016-03-09 DIAGNOSIS — D251 Intramural leiomyoma of uterus: Secondary | ICD-10-CM | POA: Diagnosis not present

## 2016-03-09 DIAGNOSIS — Z319 Encounter for procreative management, unspecified: Secondary | ICD-10-CM | POA: Diagnosis not present

## 2016-03-09 DIAGNOSIS — D250 Submucous leiomyoma of uterus: Secondary | ICD-10-CM | POA: Diagnosis not present

## 2016-03-16 ENCOUNTER — Encounter (HOSPITAL_COMMUNITY)
Admission: RE | Disposition: A | Discharge status: Home / Self Care | Payer: Self-pay | Source: Ambulatory Visit | Attending: Obstetrics and Gynecology

## 2016-03-16 ENCOUNTER — Ambulatory Visit (HOSPITAL_COMMUNITY)
Admission: RE | Admit: 2016-03-16 | Discharge: 2016-03-16 | Disposition: A | Discharge status: Home / Self Care | Payer: 59 | Source: Ambulatory Visit | Attending: Obstetrics and Gynecology | Admitting: Obstetrics and Gynecology

## 2016-03-16 ENCOUNTER — Ambulatory Visit (HOSPITAL_COMMUNITY): Payer: 59 | Admitting: Anesthesiology

## 2016-03-16 ENCOUNTER — Encounter (HOSPITAL_COMMUNITY): Payer: Self-pay | Admitting: *Deleted

## 2016-03-16 DIAGNOSIS — Z79899 Other long term (current) drug therapy: Secondary | ICD-10-CM | POA: Insufficient documentation

## 2016-03-16 DIAGNOSIS — J45909 Unspecified asthma, uncomplicated: Secondary | ICD-10-CM | POA: Diagnosis not present

## 2016-03-16 DIAGNOSIS — R011 Cardiac murmur, unspecified: Secondary | ICD-10-CM | POA: Diagnosis not present

## 2016-03-16 DIAGNOSIS — D252 Subserosal leiomyoma of uterus: Secondary | ICD-10-CM | POA: Diagnosis not present

## 2016-03-16 DIAGNOSIS — D251 Intramural leiomyoma of uterus: Secondary | ICD-10-CM | POA: Diagnosis not present

## 2016-03-16 DIAGNOSIS — D259 Leiomyoma of uterus, unspecified: Secondary | ICD-10-CM | POA: Diagnosis not present

## 2016-03-16 HISTORY — PX: CHROMOPERTUBATION: SHX6288

## 2016-03-16 HISTORY — PX: LAPAROSCOPY: SHX197

## 2016-03-16 HISTORY — PX: MYOMECTOMY: SHX85

## 2016-03-16 LAB — PREGNANCY, URINE: PREG TEST UR: NEGATIVE

## 2016-03-16 SURGERY — LAPAROSCOPY OPERATIVE
Anesthesia: General | Site: Vagina

## 2016-03-16 MED ORDER — SUGAMMADEX SODIUM 200 MG/2ML IV SOLN
INTRAVENOUS | Status: AC
  Filled 2016-03-16: qty 2

## 2016-03-16 MED ORDER — OXYCODONE-ACETAMINOPHEN 5-325 MG PO TABS: 1.0000 | ORAL_TABLET | ORAL | Status: DC | PRN

## 2016-03-16 MED ORDER — KETOROLAC TROMETHAMINE 30 MG/ML IJ SOLN
INTRAMUSCULAR | Status: DC | PRN
  Administered 2016-03-16: 30 mg via INTRAVENOUS

## 2016-03-16 MED ORDER — SCOPOLAMINE 1 MG/3DAYS TD PT72
1.0000 | MEDICATED_PATCH | Freq: Once | TRANSDERMAL | Status: DC
  Administered 2016-03-16: 1.5 mg via TRANSDERMAL

## 2016-03-16 MED ORDER — ROCURONIUM BROMIDE 100 MG/10ML IV SOLN
INTRAVENOUS | Status: AC
  Filled 2016-03-16: qty 1

## 2016-03-16 MED ORDER — SODIUM CHLORIDE 0.9 % IJ SOLN
INTRAMUSCULAR | Status: AC
  Filled 2016-03-16: qty 50

## 2016-03-16 MED ORDER — BUPIVACAINE HCL (PF) 0.5 % IJ SOLN
INTRAMUSCULAR | Status: DC | PRN
  Administered 2016-03-16: 16 mL

## 2016-03-16 MED ORDER — ONDANSETRON HCL 4 MG PO TABS: 4.0000 mg | ORAL_TABLET | Freq: Three times a day (TID) | ORAL | Status: DC | PRN

## 2016-03-16 MED ORDER — FENTANYL CITRATE (PF) 100 MCG/2ML IJ SOLN
INTRAMUSCULAR | Status: DC | PRN
  Administered 2016-03-16: 150 ug via INTRAVENOUS
  Administered 2016-03-16: 50 ug via INTRAVENOUS
  Administered 2016-03-16 (×3): 100 ug via INTRAVENOUS

## 2016-03-16 MED ORDER — DEXAMETHASONE SODIUM PHOSPHATE 10 MG/ML IJ SOLN
INTRAMUSCULAR | Status: AC
  Filled 2016-03-16: qty 1

## 2016-03-16 MED ORDER — AMMONIA AROMATIC IN INHA
RESPIRATORY_TRACT | Status: DC
Start: 2016-03-16 — End: 2016-03-16
  Filled 2016-03-16: qty 10

## 2016-03-16 MED ORDER — LIDOCAINE HCL (CARDIAC) 20 MG/ML IV SOLN
INTRAVENOUS | Status: AC
  Filled 2016-03-16: qty 5

## 2016-03-16 MED ORDER — METHYLENE BLUE 0.5 % INJ SOLN
INTRAVENOUS | Status: DC | PRN
  Administered 2016-03-16: 1 mL via SUBMUCOSAL

## 2016-03-16 MED ORDER — ONDANSETRON HCL 4 MG/2ML IJ SOLN
INTRAMUSCULAR | Status: AC
  Filled 2016-03-16: qty 2

## 2016-03-16 MED ORDER — OXYCODONE-ACETAMINOPHEN 5-325 MG PO TABS
1.0000 | ORAL_TABLET | ORAL | Status: DC | PRN
  Administered 2016-03-16 (×2): 1 via ORAL

## 2016-03-16 MED ORDER — SCOPOLAMINE 1 MG/3DAYS TD PT72
MEDICATED_PATCH | TRANSDERMAL | Status: AC
  Administered 2016-03-16: 1.5 mg via TRANSDERMAL
  Filled 2016-03-16: qty 1

## 2016-03-16 MED ORDER — PROPOFOL 10 MG/ML IV BOLUS
INTRAVENOUS | Status: AC
  Filled 2016-03-16: qty 20

## 2016-03-16 MED ORDER — FENTANYL CITRATE (PF) 100 MCG/2ML IJ SOLN
INTRAMUSCULAR | Status: AC
  Filled 2016-03-16: qty 2

## 2016-03-16 MED ORDER — ONDANSETRON HCL 4 MG/2ML IJ SOLN: 4.0000 mg | Freq: Once | INTRAMUSCULAR | Status: DC | PRN

## 2016-03-16 MED ORDER — ROCURONIUM BROMIDE 100 MG/10ML IV SOLN
INTRAVENOUS | Status: DC | PRN
  Administered 2016-03-16: 10 mg via INTRAVENOUS
  Administered 2016-03-16: 20 mg via INTRAVENOUS
  Administered 2016-03-16: 50 mg via INTRAVENOUS
  Administered 2016-03-16: 10 mg via INTRAVENOUS

## 2016-03-16 MED ORDER — BUPIVACAINE-EPINEPHRINE 0.25% -1:200000 IJ SOLN: INTRAMUSCULAR | Status: DC | PRN

## 2016-03-16 MED ORDER — ONDANSETRON HCL 4 MG/2ML IJ SOLN
INTRAMUSCULAR | Status: DC | PRN
  Administered 2016-03-16: 4 mg via INTRAVENOUS

## 2016-03-16 MED ORDER — LIDOCAINE HCL (CARDIAC) 20 MG/ML IV SOLN
INTRAVENOUS | Status: DC | PRN
  Administered 2016-03-16: 100 mg via INTRAVENOUS

## 2016-03-16 MED ORDER — KETOROLAC TROMETHAMINE 30 MG/ML IJ SOLN
INTRAMUSCULAR | Status: AC
  Filled 2016-03-16: qty 1

## 2016-03-16 MED ORDER — OXYCODONE-ACETAMINOPHEN 5-325 MG PO TABS
ORAL_TABLET | ORAL | Status: AC
  Filled 2016-03-16: qty 1

## 2016-03-16 MED ORDER — METHYLENE BLUE 1 % INJ SOLN
INTRAMUSCULAR | Status: AC
  Filled 2016-03-16: qty 10

## 2016-03-16 MED ORDER — SODIUM CHLORIDE 0.9% FLUSH
INTRAVENOUS | Status: AC
  Filled 2016-03-16: qty 3

## 2016-03-16 MED ORDER — HYDROMORPHONE HCL 1 MG/ML IJ SOLN
INTRAMUSCULAR | Status: AC
  Filled 2016-03-16: qty 1

## 2016-03-16 MED ORDER — VASOPRESSIN 20 UNIT/ML IV SOLN
INTRAVENOUS | Status: AC
  Filled 2016-03-16: qty 1

## 2016-03-16 MED ORDER — HYDROMORPHONE HCL 1 MG/ML IJ SOLN
INTRAMUSCULAR | Status: DC | PRN
  Administered 2016-03-16: 1 mg via INTRAVENOUS

## 2016-03-16 MED ORDER — DEXAMETHASONE SODIUM PHOSPHATE 10 MG/ML IJ SOLN
INTRAMUSCULAR | Status: DC | PRN
  Administered 2016-03-16: 10 mg via INTRAVENOUS

## 2016-03-16 MED ORDER — PROPOFOL 10 MG/ML IV BOLUS
INTRAVENOUS | Status: DC | PRN
  Administered 2016-03-16: 150 mg via INTRAVENOUS

## 2016-03-16 MED ORDER — BUPIVACAINE-EPINEPHRINE (PF) 0.5% -1:200000 IJ SOLN
INTRAMUSCULAR | Status: AC
  Filled 2016-03-16: qty 30

## 2016-03-16 MED ORDER — MIDAZOLAM HCL 2 MG/2ML IJ SOLN
INTRAMUSCULAR | Status: AC
  Filled 2016-03-16: qty 2

## 2016-03-16 MED ORDER — FENTANYL CITRATE (PF) 100 MCG/2ML IJ SOLN
25.0000 ug | INTRAMUSCULAR | Status: DC | PRN
  Administered 2016-03-16 (×2): 25 ug via INTRAVENOUS
  Administered 2016-03-16 (×2): 50 ug via INTRAVENOUS

## 2016-03-16 MED ORDER — HEPARIN SODIUM (PORCINE) 5000 UNIT/ML IJ SOLN
INTRAMUSCULAR | Status: AC
  Filled 2016-03-16: qty 3

## 2016-03-16 MED ORDER — LACTATED RINGERS IV SOLN
INTRAVENOUS | Status: DC
  Administered 2016-03-16 (×3): via INTRAVENOUS

## 2016-03-16 MED ORDER — VASOPRESSIN 20 UNIT/ML IV SOLN
INTRAVENOUS | Status: DC | PRN
  Administered 2016-03-16: 40 mL via INTRAMUSCULAR

## 2016-03-16 MED ORDER — OXYCODONE-ACETAMINOPHEN 5-325 MG PO TABS
ORAL_TABLET | ORAL | Status: DC
Start: 2016-03-16 — End: 2016-03-16
  Filled 2016-03-16: qty 1

## 2016-03-16 MED ORDER — FENTANYL CITRATE (PF) 250 MCG/5ML IJ SOLN
INTRAMUSCULAR | Status: AC
  Filled 2016-03-16: qty 5

## 2016-03-16 MED ORDER — MIDAZOLAM HCL 5 MG/5ML IJ SOLN
INTRAMUSCULAR | Status: DC | PRN
  Administered 2016-03-16: 2 mg via INTRAVENOUS

## 2016-03-16 MED ORDER — OXYCODONE-ACETAMINOPHEN 7.5-325 MG PO TABS: 1.0000 | ORAL_TABLET | ORAL | Status: DC | PRN

## 2016-03-16 MED ORDER — SUGAMMADEX SODIUM 200 MG/2ML IV SOLN
INTRAVENOUS | Status: DC | PRN
  Administered 2016-03-16: 174.4 mg via INTRAVENOUS

## 2016-03-16 MED ORDER — CEFAZOLIN SODIUM-DEXTROSE 2-3 GM-% IV SOLR
INTRAVENOUS | Status: DC | PRN
  Administered 2016-03-16: 2 g via INTRAVENOUS

## 2016-03-16 MED ORDER — INDIGOTINDISULFONATE SODIUM 8 MG/ML IJ SOLN: INTRAMUSCULAR | Status: DC | PRN

## 2016-03-16 MED FILL — ONDANSETRON HCL 4 MG TABLET: 4 | 6 days supply | Qty: 20 | Fill #0

## 2016-03-16 MED FILL — OXYCODONE/APAP 7.5/325MG: 7.5-325 | 3 days supply | Qty: 20 | Fill #0

## 2016-03-16 SURGICAL SUPPLY — 73 items
ADAPTER CATH SYR TO TUBING 38M (ADAPTER) IMPLANT
ADPR CATH LL SYR 3/32 TPR (ADAPTER)
BARRIER ADHS 3X4 INTERCEED (GAUZE/BANDAGES/DRESSINGS) IMPLANT
BLADE SURG 10 STRL SS (BLADE) ×2 IMPLANT
BRR ADH 4X3 ABS CNTRL BYND (GAUZE/BANDAGES/DRESSINGS)
BRR ADH 6X5 SEPRAFILM 1 SHT (MISCELLANEOUS)
CATH ROBINSON RED A/P 16FR (CATHETERS) IMPLANT
CHLORAPREP W/TINT 26ML (MISCELLANEOUS) ×2 IMPLANT
CLOTH BEACON ORANGE TIMEOUT ST (SAFETY) ×4 IMPLANT
CONT PATH 16OZ SNAP LID 3702 (MISCELLANEOUS) ×4 IMPLANT
COVER BACK TABLE 60X90IN (DRAPES) ×8 IMPLANT
COVER TIP SHEARS 8 DVNC (MISCELLANEOUS) ×3 IMPLANT
COVER TIP SHEARS 8MM DA VINCI (MISCELLANEOUS) ×1
DECANTER SPIKE VIAL GLASS SM (MISCELLANEOUS) ×8 IMPLANT
DEFOGGER SCOPE WARMER CLEARIFY (MISCELLANEOUS) ×4 IMPLANT
DEVICE TROCAR PUNCTURE CLOSURE (ENDOMECHANICALS) IMPLANT
DRSG COVADERM PLUS 2X2 (GAUZE/BANDAGES/DRESSINGS) ×14 IMPLANT
DRSG OPSITE POSTOP 3X4 (GAUZE/BANDAGES/DRESSINGS) ×6 IMPLANT
ELECT REM PT RETURN 9FT ADLT (ELECTROSURGICAL) ×4
ELECTRODE REM PT RTRN 9FT ADLT (ELECTROSURGICAL) ×3 IMPLANT
FILTER STRAW FLUID ASPIR (MISCELLANEOUS) IMPLANT
GAUZE VASELINE 3X9 (GAUZE/BANDAGES/DRESSINGS) IMPLANT
GLOVE BIO SURGEON STRL SZ8 (GLOVE) ×12 IMPLANT
GLOVE BIOGEL PI IND STRL 7.0 (GLOVE) ×3 IMPLANT
GLOVE BIOGEL PI IND STRL 8.5 (GLOVE) ×3 IMPLANT
GLOVE BIOGEL PI INDICATOR 7.0 (GLOVE) ×1
GLOVE BIOGEL PI INDICATOR 8.5 (GLOVE) ×1
KIT ABG SYR 3ML LUER SLIP (SYRINGE) ×4 IMPLANT
KIT ACCESSORY DA VINCI DISP (KITS) ×1
KIT ACCESSORY DVNC DISP (KITS) ×3 IMPLANT
LEGGING LITHOTOMY PAIR STRL (DRAPES) ×4 IMPLANT
LIQUID BAND (GAUZE/BANDAGES/DRESSINGS) ×4 IMPLANT
MANIPULATOR UTERINE 4.5 ZUMI (MISCELLANEOUS) ×4 IMPLANT
NDL SAFETY ECLIPSE 18X1.5 (NEEDLE) ×1 IMPLANT
NDL SPNL 22GX7 QUINCKE BK (NEEDLE) IMPLANT
NEEDLE HYPO 18GX1.5 SHARP (NEEDLE) ×4
NEEDLE INSUFFLATION 120MM (ENDOMECHANICALS) ×4 IMPLANT
NEEDLE SPNL 22GX7 QUINCKE BK (NEEDLE) IMPLANT
NS IRRIG 1000ML POUR BTL (IV SOLUTION) ×12 IMPLANT
PACK ROBOT WH (CUSTOM PROCEDURE TRAY) ×4 IMPLANT
PACK ROBOTIC GOWN (GOWN DISPOSABLE) ×4 IMPLANT
PAD TRENDELENBURG POSITION (MISCELLANEOUS) ×4 IMPLANT
SEPRAFILM MEMBRANE 5X6 (MISCELLANEOUS) IMPLANT
SET CYSTO W/LG BORE CLAMP LF (SET/KITS/TRAYS/PACK) IMPLANT
SET IRRIG TUBING LAPAROSCOPIC (IRRIGATION / IRRIGATOR) ×4 IMPLANT
SET TRI-LUMEN FLTR TB AIRSEAL (TUBING) IMPLANT
SLEEVE XCEL OPT CAN 5 100 (ENDOMECHANICALS) ×4 IMPLANT
SPONGE LAP 4X18 X RAY DECT (DISPOSABLE) ×2 IMPLANT
STRIP CLOSURE SKIN 1/2X4 (GAUZE/BANDAGES/DRESSINGS) ×2 IMPLANT
SUT PDS AB 4-0 SH 27 (SUTURE) IMPLANT
SUT VIC AB 2-0 CT1 27 (SUTURE) ×28
SUT VIC AB 2-0 CT1 TAPERPNT 27 (SUTURE) ×7 IMPLANT
SUT VIC AB 2-0 CT2 27 (SUTURE) ×2 IMPLANT
SUT VIC AB 4-0 SH 27 (SUTURE) ×20
SUT VIC AB 4-0 SH 27XANBCTRL (SUTURE) ×5 IMPLANT
SUT VICRYL 0 UR6 27IN ABS (SUTURE) ×2 IMPLANT
SUT VICRYL RAPIDE 4/0 PS 2 (SUTURE) ×4 IMPLANT
SUT VLOC 180 2-0 9IN GS21 (SUTURE) IMPLANT
SUT VLOC 180 3-0 9IN GS21 (SUTURE) IMPLANT
SYR 50ML LL SCALE MARK (SYRINGE) IMPLANT
SYR 50ML SLIP (SYRINGE) IMPLANT
SYR TOOMEY 50ML (SYRINGE) ×4 IMPLANT
SYS LAPSCP GELPORT 120MM (MISCELLANEOUS)
SYSTEM CONVERTIBLE TROCAR (TROCAR) IMPLANT
SYSTEM LAPSCP GELPORT 120MM (MISCELLANEOUS) ×2 IMPLANT
TOWEL OR 17X24 6PK STRL BLUE (TOWEL DISPOSABLE) ×12 IMPLANT
TRAY FOLEY BAG SILVER LF 16FR (SET/KITS/TRAYS/PACK) ×4 IMPLANT
TROCAR 12M 150ML BLUNT (TROCAR) IMPLANT
TROCAR DILATING TIP 12MM 150MM (ENDOMECHANICALS) ×2 IMPLANT
TROCAR DISP BLADELESS 8 DVNC (TROCAR) ×3 IMPLANT
TROCAR DISP BLADELESS 8MM (TROCAR) ×1
TROCAR PORT AIRSEAL 5X120 (TROCAR) IMPLANT
TROCAR PORT AIRSEAL 8X100 (TROCAR) ×2 IMPLANT

## 2016-03-16 NOTE — Progress Notes (Signed)
While in bathroom patient felt faint. Never lost consciousness. Fiance with patient at time. Arrived to find patient conscious and alert, limp. Assisted to wheelchair then back to bed. Alert, oriented, crying. Small amount of bleeding from right medial incision, steri strips removed and reapplied. Honeycomb reapplied to lower incision.

## 2016-03-16 NOTE — Anesthesia Procedure Notes (Signed)
Procedure Name: Intubation Date/Time: 03/16/2016 11:30 AM Performed by: Riki Sheer Pre-anesthesia Checklist: Patient identified, Emergency Drugs available, Suction available, Patient being monitored and Timeout performed Patient Re-evaluated:Patient Re-evaluated prior to inductionOxygen Delivery Method: Circle system utilized Preoxygenation: Pre-oxygenation with 100% oxygen Intubation Type: IV induction Ventilation: Mask ventilation without difficulty Laryngoscope Size: Mac and 3 Grade View: Grade I Tube size: 7.0 mm Number of attempts: 2 Airway Equipment and Method: Bougie stylet and Stylet Placement Confirmation: positive ETCO2 and breath sounds checked- equal and bilateral Dental Injury: Teeth and Oropharynx as per pre-operative assessment  Comments: DL x 1 able to see cords, ETT hung up on teeth, ETT  Pulled out. DL and bougie stylette advanced, ETT in place, atraumatic intubation, BBS equal, +ETCO2 noted.

## 2016-03-16 NOTE — Anesthesia Postprocedure Evaluation (Signed)
Anesthesia Post Note  Patient: Stacey Carlson  Procedure(s) Performed: Procedure(s): LAPAROSCOPY OPERATIVE MYOMECTOMY CHROMOPERTUBATION  Patient location during evaluation: PACU Anesthesia Type: General Level of consciousness: awake and alert Pain management: pain level controlled Vital Signs Assessment: post-procedure vital signs reviewed and stable Respiratory status: spontaneous breathing, nonlabored ventilation, respiratory function stable and patient connected to nasal cannula oxygen Cardiovascular status: blood pressure returned to baseline and stable Postop Assessment: no signs of nausea or vomiting Anesthetic complications: no     Last Vitals:  Filed Vitals:   03/16/16 1530 03/16/16 1545  BP: 109/58 101/59  Pulse: 73 68  Temp:    Resp: 12 18    Last Pain:  Filed Vitals:   03/16/16 1558  PainSc: 5    Pain Goal: Patients Stated Pain Goal: 4 (03/16/16 1545)               Trinadee Verhagen JENNETTE

## 2016-03-16 NOTE — Discharge Instructions (Signed)
DISCHARGE INSTRUCTIONS: Laparoscopy  The following instructions have been prepared to help you care for yourself upon your return home today.  Wound care:  Do not get the incision wet for the first 24 hours. The incision should be kept clean and dry.  The Band-Aids or dressings may be removed the day after surgery.  Should the incision become sore, red, and swollen after the first week, check with your doctor.  Personal hygiene:  Shower the day after your procedure.  Activity and limitations:  Do NOT drive or operate any equipment today.  Do NOT lift anything more than 15 pounds for 2-3 weeks after surgery.  Do NOT rest in bed all day.  Walking is encouraged. Walk each day, starting slowly with 5-minute walks 3 or 4 times a day. Slowly increase the length of your walks.  Walk up and down stairs slowly.  Do NOT do strenuous activities, such as golfing, playing tennis, bowling, running, biking, weight lifting, gardening, mowing, or vacuuming for 2-4 weeks. Ask your doctor when it is okay to start.  Diet: Eat a light meal as desired this evening. You may resume your usual diet tomorrow.  Return to work: This is dependent on the type of work you do. For the most part you can return to a desk job within a week of surgery. If you are more active at work, please discuss this with your doctor.  What to expect after your surgery: You may have a slight burning sensation when you urinate on the first day. You may have a very small amount of blood in the urine. Expect to have a small amount of vaginal discharge/light bleeding for 1-2 weeks. It is not unusual to have abdominal soreness and bruising for up to 2 weeks. You may be tired and need more rest for about 1 week. You may experience shoulder pain for 24-72 hours. Lying flat in bed may relieve it.  Call your doctor for any of the following:  Develop a fever of 100.4 or greater  Inability to urinate 6 hours after discharge from  hospital  Severe pain not relieved by pain medications  Persistent of heavy bleeding at incision site  Redness or swelling around incision site after a week  Increasing nausea or vomiting  Patient Signature________________________________________ Nurse Signature_________________________________________ Post Anesthesia Home Care Instructions  NO IBUPROFEN PRODUCTS UNTIL: 7:45 PM TODAY  Activity: Get plenty of rest for the remainder of the day. A responsible adult should stay with you for 24 hours following the procedure.  For the next 24 hours, DO NOT: -Drive a car -Paediatric nurse -Drink alcoholic beverages -Take any medication unless instructed by your physician -Make any legal decisions or sign important papers.  Meals: Start with liquid foods such as gelatin or soup. Progress to regular foods as tolerated. Avoid greasy, spicy, heavy foods. If nausea and/or vomiting occur, drink only clear liquids until the nausea and/or vomiting subsides. Call your physician if vomiting continues.  Special Instructions/Symptoms: Your throat may feel dry or sore from the anesthesia or the breathing tube placed in your throat during surgery. If this causes discomfort, gargle with warm salt water. The discomfort should disappear within 24 hours.  If you had a scopolamine patch placed behind your ear for the management of post- operative nausea and/or vomiting:  1. The medication in the patch is effective for 72 hours, after which it should be removed.  Wrap patch in a tissue and discard in the trash. Wash hands thoroughly with soap  and water. 2. You may remove the patch earlier than 72 hours if you experience unpleasant side effects which may include dry mouth, dizziness or visual disturbances. 3. Avoid touching the patch. Wash your hands with soap and water after contact with the patch.

## 2016-03-16 NOTE — Anesthesia Preprocedure Evaluation (Signed)
Anesthesia Evaluation  Patient identified by MRN, date of birth, ID band Patient awake    Reviewed: Allergy & Precautions, NPO status , Patient's Chart, lab work & pertinent test results  History of Anesthesia Complications Negative for: history of anesthetic complications  Airway Mallampati: II  TM Distance: >3 FB Neck ROM: Full    Dental no notable dental hx. (+) Dental Advisory Given   Pulmonary asthma ,    Pulmonary exam normal breath sounds clear to auscultation       Cardiovascular negative cardio ROS Normal cardiovascular exam Rhythm:Regular Rate:Normal     Neuro/Psych negative neurological ROS  negative psych ROS   GI/Hepatic negative GI ROS, Neg liver ROS,   Endo/Other  obesity  Renal/GU negative Renal ROS  negative genitourinary   Musculoskeletal negative musculoskeletal ROS (+)   Abdominal   Peds negative pediatric ROS (+)  Hematology negative hematology ROS (+)   Anesthesia Other Findings   Reproductive/Obstetrics negative OB ROS                             Anesthesia Physical Anesthesia Plan  ASA: II  Anesthesia Plan: General   Post-op Pain Management:    Induction: Intravenous  Airway Management Planned: Oral ETT  Additional Equipment:   Intra-op Plan:   Post-operative Plan: Extubation in OR  Informed Consent: I have reviewed the patients History and Physical, chart, labs and discussed the procedure including the risks, benefits and alternatives for the proposed anesthesia with the patient or authorized representative who has indicated his/her understanding and acceptance.   Dental advisory given  Plan Discussed with: CRNA  Anesthesia Plan Comments:         Anesthesia Quick Evaluation

## 2016-03-16 NOTE — H&P (Signed)
History and Physical Interval Note:  03/16/2016 11:12 AM  Stacey Carlson  has presented today for surgery, with the diagnosis of UTERINE FIBROIDS   The various methods of treatment have been discussed with the patient and family. After consideration of risks, benefits and other options for treatment, the patient has consented to  Procedure(s) with comments: ROBOTIC ASSISTED MYOMECTOMY (N/A) - TINA TO RNFA  as a surgical intervention .  The patient's history has been reviewed, patient examined, no change in status, stable for surgery.  I have reviewed the patient's chart and labs.  Questions were answered to the patient's satisfaction.     Governor Specking

## 2016-03-16 NOTE — Transfer of Care (Signed)
Immediate Anesthesia Transfer of Care Note  Patient: Stacey Carlson  Procedure(s) Performed: Procedure(s): LAPAROSCOPY OPERATIVE MYOMECTOMY CHROMOPERTUBATION  Patient Location: PACU  Anesthesia Type:General  Level of Consciousness: awake, alert  and oriented  Airway & Oxygen Therapy: Patient Spontanous Breathing and Patient connected to nasal cannula oxygen  Post-op Assessment: Report given to RN and Post -op Vital signs reviewed and stable  Post vital signs: Reviewed and stable  Last Vitals:  Filed Vitals:   03/16/16 1037  BP: 136/83  Pulse: 86  Temp: 36.7 C  Resp: 20    Last Pain: There were no vitals filed for this visit.    Patients Stated Pain Goal: 4 ([id number])  Complications: No apparent anesthesia complications

## 2016-03-16 NOTE — Op Note (Signed)
OPERATIVE NOTE   Preoperative diagnosis:  Uterine myomas, pelvic pain   Postoperative diagnosis: Uterine myomas, pelvic pain    Procedure: Laparoscopy, GelPort assisted myomectomy  Anesthesia: Gen. endotracheal   Surgeon: Governor Specking   Assistant: Olivia Mackie RNFA  Complications: None   Estimated blood loss: 100 mL.  Specimens: Myoma specimens to pathology.  Findings: On exam under anesthesia, the uterus was enlarged to 1415 gestational weeks size with multiple nodules representing myomas, a-8 cm posterior nodule was particularly prominent. There were no adnexal masses. There was no posterior fornix nodularity.  On laparoscopy, liver edge and gallbladder appeared normal diaphragm surfaces were normal. There were pericecal adhesions to the anterior abdominal wall. The appendix appeared normal. The uterus was enlarged with multiple myomas, intramural and subserosal. There were 3 myomas 3 cm each, in intramural locations on the anterior face of the uterus. There was a right posterior intramural myoma measuring about 8 cm. In addition there were 2 fibroids 3 cm each elsewhere on the posterior wall of the uterus in intramural location. Altogether 7 myomas were removed. Both tubes appeared normal proximally and distally. Fimbria were rated at 5 out of 5. The tubes were patent to chromotubation. Both ovaries appeared normal. There was no evidence of endometriotic lesions in the posterior cul-de-sac peritoneum.  Description of the procedure: Patient was placed in lithotomy position and general endotracheal anesthesia was given. 2 g of cefazolin were  given intravenously for prophylaxis. She was prepped and draped in sterile manner. A Foley catheter was inserted into the bladder appeared . A ZUMI uterine manipulator was inserted after some difficulty, into the uterus for uterine manipulation and chromotubation. The uterus sounded to 9.5-10 cm. . The uterine cavity was deviated to the left with the right  sided myoma.   An operative field was created on the abdomen and the surgeon was regloved. After preemptive anesthesia with  0.5% bupivacaine and 1:200,000 epinephrine, and a supra-umbilical skin incision was made. A Verress needle was inserted and pneumoperitoneum was created with carbon dioxide. A 12 mm trocar was placed at this incision. Robotic laparoscope with 3-D camera was inserted and, under direct visualization, 2 left lateral 14mm incisions were made and corresponding robotic trochars were placed. On the right side a third robotic trocar was placed as well as a 12 mm assistant port.  At this point it became clear that the assistant M.D. surgeon that was expected to help with the robotic myomectomy was not available therefore we decided to use GelPort assistance, rather than da Vinci robotic assistance. We made a 3 cm suprapubic transverse skin incision. After dissection of the anatomic layers peritoneal cavity was entered. A GelPort was placed. The GelPort was periodically opened to use the port as a minilaparotomy access site and at other times it was closed and the procedure was resumed laparoscopically.  A dilute vasopressin solution (0.33 units per milliliter) was injected transcutaneously into the myometrium of the uterus.  Chromotubation was performed. Above findings were noted. The GelPort was opened and the myometrium overlying the myomas were cut with needle electrode and the myomas were carefully dissected and removed. A total of 2 incisions were made anteriorly and 3 incisions were made posteriorly. The large right-sided posterior 8 cm myoma required in situ morcellation. The myoma defects were closed in 3 layers. The first layer was a deep continuous interlocking suture of 2-0 Vicryl, the second layer was a superficial myometrial layer of 2-0 Vicryl continuous suture. The serosa was closed with 4-0 Vicryl continuous  suture. Pelvis was copiously irrigated with lactated Ringer solution and a  slurry of Seprafilm (1 sheet in 40 mL of lactated Ringer solution) was instilled into the pelvis as an adhesion barrier. The Pfannenstiel 3 cm incision was closed with 2-0 Vicryl suture on the fascia and 4-0 Vicryl rapid subcuticular suture on the skin. The supra-umbilical incision was closed with 0 Vicryl figure-of-eight suture. The skin incisions were closed with 4-0 Vicryl Rapide sutures.  At this point the procedure was terminated hemostasis was insured. Instrument and lap pad count was correct.   The patient tolerated the procedure well and was transferred to recovery room in satisfactory condition.  Special Note: Due to to significant myometrial incisions, the patient is recommended to have cesarean delivery with future pregnancies.   Governor Specking, MD

## 2016-03-17 ENCOUNTER — Encounter (HOSPITAL_COMMUNITY): Payer: Self-pay | Admitting: Obstetrics and Gynecology

## 2016-03-21 MED FILL — traMADol HCL 50 MG TABS: 50 | 4 days supply | Qty: 20 | Fill #0

## 2016-08-18 MED FILL — LETROZOLE 2.5 MG TABLET: 2.5 | 5 days supply | Qty: 15 | Fill #0

## 2016-08-22 MED FILL — OVIDREL 250 MCG/0.5 ML SYRG: 250 | 30 days supply | Qty: 1 | Fill #0

## 2016-08-27 DIAGNOSIS — Z3141 Encounter for fertility testing: Secondary | ICD-10-CM | POA: Diagnosis not present

## 2016-08-27 DIAGNOSIS — Z319 Encounter for procreative management, unspecified: Secondary | ICD-10-CM | POA: Diagnosis not present

## 2016-08-31 DIAGNOSIS — Z3189 Encounter for other procreative management: Secondary | ICD-10-CM | POA: Diagnosis not present

## 2016-09-14 MED FILL — OVIDREL 250 MCG/0.5 ML SYRG: 250 | 30 days supply | Qty: 1 | Fill #1

## 2016-09-14 MED FILL — LETROZOLE 2.5 MG TABLET: 2.5 | 5 days supply | Qty: 15 | Fill #1

## 2016-09-23 DIAGNOSIS — Z319 Encounter for procreative management, unspecified: Secondary | ICD-10-CM | POA: Diagnosis not present

## 2016-09-26 DIAGNOSIS — Z3189 Encounter for other procreative management: Secondary | ICD-10-CM | POA: Diagnosis not present

## 2016-11-09 DIAGNOSIS — Z3141 Encounter for fertility testing: Secondary | ICD-10-CM | POA: Diagnosis not present

## 2016-11-09 DIAGNOSIS — E288 Other ovarian dysfunction: Secondary | ICD-10-CM | POA: Diagnosis not present

## 2016-11-09 DIAGNOSIS — Z113 Encounter for screening for infections with a predominantly sexual mode of transmission: Secondary | ICD-10-CM | POA: Diagnosis not present

## 2016-11-09 DIAGNOSIS — Z319 Encounter for procreative management, unspecified: Secondary | ICD-10-CM | POA: Diagnosis not present

## 2016-11-10 DIAGNOSIS — N8500 Endometrial hyperplasia, unspecified: Secondary | ICD-10-CM | POA: Diagnosis not present

## 2016-11-10 MED FILL — BD 3 ML SYRINGE 18GX1-1/2": 18G X 1-1/2 | 30 days supply | Qty: 60 | Fill #0

## 2016-11-10 MED FILL — BD NEEDLES 30GX0.5": 30G X 1/2" | 30 days supply | Qty: 30 | Fill #0

## 2016-11-10 MED FILL — GONAL-F 1,050 UNITS VIAL: 1050 | 3 days supply | Qty: 3 | Fill #0

## 2016-11-10 MED FILL — DOXYCYCLINE HYCLATE 100 MG: 100 | 20 days supply | Qty: 40 | Fill #0

## 2016-11-10 MED FILL — PREGNYL 10,000 UNITS VIAL: 10000 | 1 days supply | Qty: 1 | Fill #0

## 2016-11-10 MED FILL — BD NEEDLES 30GX0.5: 30G X 1/2" | 30 days supply | Qty: 30 | Fill #0

## 2016-11-10 MED FILL — MENOPUR 75 UNIT VIAL: 75 | 20 days supply | Qty: 20 | Fill #0

## 2016-11-10 MED FILL — ESTRADIOL 0.1 MG PATCH: 0.1 | 56 days supply | Qty: 16 | Fill #0

## 2016-11-10 MED FILL — ESTRADIOL 2 MG TABLET: 2 | 30 days supply | Qty: 60 | Fill #0

## 2016-11-10 MED FILL — BD NEEDLES 22GX1.5": 22G X 1-1/2 | 30 days supply | Qty: 30 | Fill #0

## 2016-11-10 MED FILL — METHYLPREDNISOLONE 4 MG TAB: 4 | 4 days supply | Qty: 16 | Fill #0

## 2016-11-10 MED FILL — BD 3 ML SYRINGE 18GX1-1/2: 18G X 1-1/2 | 30 days supply | Qty: 60 | Fill #0

## 2016-11-10 MED FILL — CETROTIDE 0.25 MG KIT: 0.25 | 7 days supply | Qty: 7 | Fill #0

## 2016-11-10 MED FILL — BD NEEDLES 22GX1.5: 22G X 1-1/2 | 30 days supply | Qty: 30 | Fill #0

## 2016-11-10 MED FILL — PROGESTERONE OIL 50 MG/ML V: 50 | 30 days supply | Qty: 30 | Fill #0

## 2016-12-28 DIAGNOSIS — Z3183 Encounter for assisted reproductive fertility procedure cycle: Secondary | ICD-10-CM | POA: Diagnosis not present

## 2017-01-05 DIAGNOSIS — Z3183 Encounter for assisted reproductive fertility procedure cycle: Secondary | ICD-10-CM | POA: Diagnosis not present

## 2017-01-13 DIAGNOSIS — N8500 Endometrial hyperplasia, unspecified: Secondary | ICD-10-CM | POA: Diagnosis not present

## 2017-01-13 DIAGNOSIS — E288 Other ovarian dysfunction: Secondary | ICD-10-CM | POA: Diagnosis not present

## 2017-01-13 DIAGNOSIS — Z113 Encounter for screening for infections with a predominantly sexual mode of transmission: Secondary | ICD-10-CM | POA: Diagnosis not present

## 2017-01-13 DIAGNOSIS — R69 Illness, unspecified: Secondary | ICD-10-CM | POA: Diagnosis not present

## 2017-01-13 DIAGNOSIS — Z3183 Encounter for assisted reproductive fertility procedure cycle: Secondary | ICD-10-CM | POA: Diagnosis not present

## 2017-01-18 DIAGNOSIS — Z3141 Encounter for fertility testing: Secondary | ICD-10-CM | POA: Diagnosis not present

## 2017-01-18 DIAGNOSIS — Z3189 Encounter for other procreative management: Secondary | ICD-10-CM | POA: Diagnosis not present

## 2017-01-18 DIAGNOSIS — D250 Submucous leiomyoma of uterus: Secondary | ICD-10-CM | POA: Diagnosis not present

## 2017-01-18 DIAGNOSIS — Z3183 Encounter for assisted reproductive fertility procedure cycle: Secondary | ICD-10-CM | POA: Diagnosis not present

## 2017-01-18 DIAGNOSIS — Z48816 Encounter for surgical aftercare following surgery on the genitourinary system: Secondary | ICD-10-CM | POA: Diagnosis not present

## 2017-01-18 DIAGNOSIS — Z3161 Procreative counseling and advice using natural family planning: Secondary | ICD-10-CM | POA: Diagnosis not present

## 2017-01-18 DIAGNOSIS — Z319 Encounter for procreative management, unspecified: Secondary | ICD-10-CM | POA: Diagnosis not present

## 2017-01-18 DIAGNOSIS — E288 Other ovarian dysfunction: Secondary | ICD-10-CM | POA: Diagnosis not present

## 2017-01-18 DIAGNOSIS — D259 Leiomyoma of uterus, unspecified: Secondary | ICD-10-CM | POA: Diagnosis not present

## 2017-01-20 DIAGNOSIS — Z3183 Encounter for assisted reproductive fertility procedure cycle: Secondary | ICD-10-CM | POA: Diagnosis not present

## 2017-01-20 DIAGNOSIS — E288 Other ovarian dysfunction: Secondary | ICD-10-CM | POA: Diagnosis not present

## 2017-01-23 DIAGNOSIS — E288 Other ovarian dysfunction: Secondary | ICD-10-CM | POA: Diagnosis not present

## 2017-01-23 DIAGNOSIS — Z3183 Encounter for assisted reproductive fertility procedure cycle: Secondary | ICD-10-CM | POA: Diagnosis not present

## 2017-01-24 DIAGNOSIS — E288 Other ovarian dysfunction: Secondary | ICD-10-CM | POA: Diagnosis not present

## 2017-01-24 DIAGNOSIS — Z3183 Encounter for assisted reproductive fertility procedure cycle: Secondary | ICD-10-CM | POA: Diagnosis not present

## 2017-01-24 DIAGNOSIS — Z113 Encounter for screening for infections with a predominantly sexual mode of transmission: Secondary | ICD-10-CM | POA: Diagnosis not present

## 2017-01-24 MED FILL — PROMETHAZINE 12.5 MG TABLET: 12.5 | 3 days supply | Qty: 10 | Fill #0

## 2017-01-24 MED FILL — OXYCODONE W/APAP 5/325 TAB: 5-325 | 3 days supply | Qty: 10 | Fill #0

## 2017-01-25 DIAGNOSIS — Z3183 Encounter for assisted reproductive fertility procedure cycle: Secondary | ICD-10-CM | POA: Diagnosis not present

## 2017-01-27 DIAGNOSIS — Z3183 Encounter for assisted reproductive fertility procedure cycle: Secondary | ICD-10-CM | POA: Diagnosis not present

## 2017-01-30 DIAGNOSIS — N8500 Endometrial hyperplasia, unspecified: Secondary | ICD-10-CM | POA: Diagnosis not present

## 2017-02-01 DIAGNOSIS — Z3183 Encounter for assisted reproductive fertility procedure cycle: Secondary | ICD-10-CM | POA: Diagnosis not present

## 2017-02-01 MED FILL — CIPROFLOXACIN HCL 500 MG TA: 500 | 10 days supply | Qty: 20 | Fill #0

## 2017-02-01 MED FILL — metroNIDAZOLE 500 MG TABS: 500 | 10 days supply | Qty: 20 | Fill #0

## 2017-02-20 DIAGNOSIS — N711 Chronic inflammatory disease of uterus: Secondary | ICD-10-CM | POA: Diagnosis not present

## 2017-02-20 DIAGNOSIS — N858 Other specified noninflammatory disorders of uterus: Secondary | ICD-10-CM | POA: Diagnosis not present

## 2017-02-20 MED FILL — DOXYCYCLINE HYCLATE 100 MG: 100 | 5 days supply | Qty: 10 | Fill #0

## 2017-03-03 DIAGNOSIS — N736 Female pelvic peritoneal adhesions (postinfective): Secondary | ICD-10-CM | POA: Diagnosis not present

## 2017-03-03 DIAGNOSIS — N856 Intrauterine synechiae: Secondary | ICD-10-CM | POA: Diagnosis not present

## 2017-03-03 MED FILL — ESTRADIOL 2 MG TABLET: 2 | 30 days supply | Qty: 120 | Fill #0

## 2017-03-03 MED FILL — MEDROXYPROGESTERONE 10 MG T: 10 | 5 days supply | Qty: 5 | Fill #0

## 2017-03-03 MED FILL — LARIN 21 1-20 TABLET: 1-20 | 28 days supply | Qty: 21 | Fill #0

## 2017-04-17 DIAGNOSIS — Z3141 Encounter for fertility testing: Secondary | ICD-10-CM | POA: Diagnosis not present

## 2017-04-17 DIAGNOSIS — Z319 Encounter for procreative management, unspecified: Secondary | ICD-10-CM | POA: Diagnosis not present

## 2017-04-17 MED FILL — DOXYCYCLINE HYCLATE 100 MG: 100 | 5 days supply | Qty: 10 | Fill #0

## 2017-04-18 DIAGNOSIS — N8500 Endometrial hyperplasia, unspecified: Secondary | ICD-10-CM | POA: Diagnosis not present

## 2017-05-02 DIAGNOSIS — Z319 Encounter for procreative management, unspecified: Secondary | ICD-10-CM | POA: Diagnosis not present

## 2017-05-02 DIAGNOSIS — Z3183 Encounter for assisted reproductive fertility procedure cycle: Secondary | ICD-10-CM | POA: Diagnosis not present

## 2017-05-08 DIAGNOSIS — Z319 Encounter for procreative management, unspecified: Secondary | ICD-10-CM | POA: Diagnosis not present

## 2017-05-08 DIAGNOSIS — Z3183 Encounter for assisted reproductive fertility procedure cycle: Secondary | ICD-10-CM | POA: Diagnosis not present

## 2017-05-08 MED FILL — MEDROXYPROGESTERONE 10 MG T: 10 | 10 days supply | Qty: 10 | Fill #0

## 2017-05-26 DIAGNOSIS — N856 Intrauterine synechiae: Secondary | ICD-10-CM | POA: Diagnosis not present

## 2017-05-26 DIAGNOSIS — N840 Polyp of corpus uteri: Secondary | ICD-10-CM | POA: Diagnosis not present

## 2017-05-26 MED FILL — ESTRADIOL 2 MG TABLET: 2 | 30 days supply | Qty: 120 | Fill #0

## 2017-05-26 MED FILL — MEDROXYPROGESTERONE 10 MG T: 10 | 5 days supply | Qty: 5 | Fill #0

## 2017-06-11 ENCOUNTER — Encounter (HOSPITAL_COMMUNITY): Payer: Self-pay | Admitting: Emergency Medicine

## 2017-06-11 ENCOUNTER — Ambulatory Visit (INDEPENDENT_AMBULATORY_CARE_PROVIDER_SITE_OTHER): Payer: 59

## 2017-06-11 ENCOUNTER — Ambulatory Visit (HOSPITAL_COMMUNITY)
Admission: EM | Admit: 2017-06-11 | Discharge: 2017-06-11 | Disposition: A | Discharge status: Home / Self Care | Payer: 59 | Attending: Family Medicine | Admitting: Family Medicine

## 2017-06-11 DIAGNOSIS — M25562 Pain in left knee: Secondary | ICD-10-CM | POA: Diagnosis not present

## 2017-06-11 DIAGNOSIS — S8000XA Contusion of unspecified knee, initial encounter: Secondary | ICD-10-CM

## 2017-06-11 DIAGNOSIS — S8992XA Unspecified injury of left lower leg, initial encounter: Secondary | ICD-10-CM | POA: Diagnosis not present

## 2017-06-11 NOTE — Discharge Instructions (Signed)
X-rays are negative. This appears to be a simple contusion which should respond to ibuprofen and ice packs for the next day or so. There is no sign of a dangerous or deep seated injury

## 2017-06-11 NOTE — ED Triage Notes (Signed)
Pt sts fell on knees yesterday c/o pain

## 2017-06-11 NOTE — ED Provider Notes (Signed)
[place]    CSN: [id number] Arrival date & time: 06/11/17  1226     History   Chief Complaint Chief Complaint  Patient presents with  . Knee Pain    HPI Stacey Carlson is a 41 y.o. female.   Pt sts fell on knees yesterday c/o pain  She had been wearing her husband still touch she was yesterday when she was walking down stairs and lost her balance. She fell forward and partially caught herself with her left hand, but she still struck both knee caps onset is stairs or the base of the stairs. She has more pain and swelling over the left patella.  She has had 2 arthroscopic procedures done on the right heel in the past.  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 Asthma    rarely uses inhaler - exercise induced   . Fibroids   . Murmur   . Seasonal allergies   . SVD (spontaneous vaginal delivery)    x 1    Patient Active Problem List   Diagnosis Date Noted  . Encounter for fertility planning 07/13/2015  . ASCUS with positive high risk HPV 03/19/2015  . Well woman exam 02/21/2012  . Palpitations 02/03/2012  . PVC (premature ventricular contraction) 02/03/2012  . History of AGUS pap 2012 10/15/2009  . OBESITY, UNSPECIFIED 08/21/2009  . Allergic rhinitis 08/21/2009    Past Surgical History:  Procedure Laterality Date  . CHROMOPERTUBATION  03/16/2016   Procedure: CHROMOPERTUBATION;  Surgeon: Governor Specking, MD;  Location: [facility];  Service: Gynecology;;  . KNEE SURGERY Right    x 2  . LAPAROSCOPY  03/16/2016   Procedure: LAPAROSCOPY OPERATIVE;  Surgeon: Governor Specking, MD;  Location: [facility];  Service: Gynecology;;  . MYOMECTOMY  03/16/2016   Procedure: MYOMECTOMY;  Surgeon: Governor Specking, MD;  Location: [facility];  Service: Gynecology;;  . WISDOM TOOTH EXTRACTION      OB History    Gravida Para Term Preterm AB Living   1 1 1          SAB TAB Ectopic Multiple Live  Births                   Home Medications    Prior to Admission medications   Medication Sig Start Date End Date Taking? Authorizing Provider  ACAI BERRY PO Take 2 capsules by mouth 2 (two) times daily as needed (For constipation.).    [provider]  albuterol (VENTOLIN HFA) 108 (90 Base) MCG/ACT inhaler Inhale 1-2 puffs into the lungs every 4 (four) hours as needed. And before exercise 11/03/15   Katheren Shams, DO  fluticasone [facility]) 50 MCG/ACT nasal spray Place 2 sprays into both nostrils daily. Per nostril 11/03/15   Luiz Blare Y, DO  Olopatadine HCl (PATADAY) 0.2 % SOLN Apply 1 drop to eye as needed. For allergies 11/03/15   Katheren Shams, DO  ondansetron (ZOFRAN) 4 MG tablet Take 1 tablet (4 mg total) by mouth every 8 (eight) hours as needed for nausea or vomiting. 03/16/16   Governor Specking, MD  oxyCODONE-acetaminophen (PERCOCET) 7.5-325 MG tablet Take 1 tablet by mouth every 4 (four) hours as needed for severe pain. 03/16/16   Governor Specking, MD    Family History History reviewed. No pertinent family history.  Social History Social History  Substance Use Topics  . Smoking status: Never Smoker  .  Smokeless tobacco: Never Used  . Alcohol use No     Allergies   Latex   Review of Systems Review of Systems  Musculoskeletal: Positive for gait problem and joint swelling.  All other systems reviewed and are negative.    Physical Exam Triage Vital Signs ED Triage Vitals  Enc Vitals Group     BP 06/11/17 1337 (!) 148/71     Pulse Rate 06/11/17 1337 73     Resp 06/11/17 1337 18     Temp 06/11/17 1337 98.1 F (36.7 C)     Temp Source 06/11/17 1337 Oral     SpO2 06/11/17 1337 98 %     Weight 06/11/17 1337 217 lb (98.4 kg)     Height 06/11/17 1337 5\' 3"  (1.6 m)     Head Circumference --      Peak Flow --      Pain Score 06/11/17 1338 5     Pain Loc --      Pain Edu? --      Excl. in [place]? --    No data found.   Updated Vital Signs BP (!)  148/71 (BP Location: Right Arm)   Pulse 73   Temp 98.1 F (36.7 C) (Oral)   Resp 18   Ht 5\' 3"  (1.6 m)   Wt 217 lb (98.4 kg)   SpO2 98%   BMI 38.44 kg/m   Visual Acuity Right Eye Distance:   Left Eye Distance:   Bilateral Distance:    Right Eye Near:   Left Eye Near:    Bilateral Near:     Physical Exam  Constitutional: She appears well-developed and well-nourished.  HENT:  Head: Normocephalic.  Right Ear: External ear normal.  Left Ear: External ear normal.  Mouth/Throat: Oropharynx is clear and moist.  Eyes: Pupils are equal, round, and reactive to light. Conjunctivae are normal.  Neck: Normal range of motion. Neck supple.  Musculoskeletal: Normal range of motion.  Left wrist has full range of motion and no swelling Left knee reveals no significant localized tenderness, no effusion, 2 cm superficial abrasion over the patella, no ligamentous laxity. Right knee reveals old arthroscopy scars, no ligamentous laxity, no tenderness, no abrasion, no ligamentous laxity, no effusion.  Skin: Skin is warm and dry.  Nursing note and vitals reviewed.    UC Treatments / Results  Labs (all labs ordered are listed, but only abnormal results are displayed) Labs Reviewed - No data to display  EKG  EKG Interpretation None       Radiology No results found.  Procedures Procedures (including critical care time)  Medications Ordered in UC Medications - No data to display   Initial Impression / Assessment and Plan / UC Course  I have reviewed the triage vital signs and the nursing notes.  Pertinent labs & imaging results that were available during my care of the patient were reviewed by me and considered in my medical decision making (see chart for details).     Final Clinical Impressions(s) / UC Diagnoses   Final diagnoses:  Contusion of knee, unspecified laterality, initial encounter    New Prescriptions New Prescriptions   No medications on file      Controlled Substance Prescriptions [place] Controlled Substance Registry consulted? Not Applicable   Robyn Haber, MD 06/11/17 1441

## 2017-06-27 ENCOUNTER — Ambulatory Visit: Payer: 59 | Admitting: Family Medicine

## 2017-06-29 ENCOUNTER — Ambulatory Visit (INDEPENDENT_AMBULATORY_CARE_PROVIDER_SITE_OTHER): Payer: 59 | Admitting: Family Medicine

## 2017-06-29 ENCOUNTER — Ambulatory Visit (HOSPITAL_COMMUNITY)
Admission: RE | Admit: 2017-06-29 | Discharge: 2017-06-29 | Disposition: A | Discharge status: Home / Self Care | Payer: 59 | Source: Ambulatory Visit | Attending: Family Medicine | Admitting: Family Medicine

## 2017-06-29 ENCOUNTER — Encounter: Payer: Self-pay | Admitting: Family Medicine

## 2017-06-29 VITALS — BP 124/76 | HR 86 | Temp 98.2°F | Wt 221.0 lb

## 2017-06-29 DIAGNOSIS — R002 Palpitations: Secondary | ICD-10-CM

## 2017-06-29 NOTE — Progress Notes (Signed)
   Subjective:    Patient ID: Stacey Carlson, female    DOB: Sep 26, 1976, 41 y.o.   MRN: [id number]   XT:[id number]  HPI: Patient is a 41 year old female with a past medical history significant for asthma and heart murmur who presented today complaining of palpitations. Patient reports that in the past she's had episodes of palpitations intermittently. However, in the past months he has had multiple episodes that have worried her. Patient reports that palpitations episodes are accompanied with shortness of breath. Patient denies any shortness of breath or chest pain. Patient denies increased caffeine intake or albuterol use. Patient denies any dietary change or new medication except for estrogen therapy for infertility. Patient denied any other acute symptoms.  Smoking status reviewed   ROS: all other systems were reviewed and are negative other than in the HPI   Past Medical History:  Diagnosis Date  . Asthma    rarely uses inhaler - exercise induced   . Fibroids   . Murmur   . Seasonal allergies   . SVD (spontaneous vaginal delivery)    x 1    Past Surgical History:  Procedure Laterality Date  . CHROMOPERTUBATION  03/16/2016   Procedure: CHROMOPERTUBATION;  Surgeon: Governor Specking, MD;  Location: [facility];  Service: Gynecology;;  . KNEE SURGERY Right    x 2  . LAPAROSCOPY  03/16/2016   Procedure: LAPAROSCOPY OPERATIVE;  Surgeon: Governor Specking, MD;  Location: [facility];  Service: Gynecology;;  . MYOMECTOMY  03/16/2016   Procedure: MYOMECTOMY;  Surgeon: Governor Specking, MD;  Location: [facility];  Service: Gynecology;;  . WISDOM TOOTH EXTRACTION      Past medical history, surgical, family, and social history reviewed and updated in the EMR as appropriate.  Objective:  BP 124/76   Pulse 86   Temp 98.2 F (36.8 C) (Oral)   Wt 221 lb (100.2 kg)   LMP 06/10/2017   SpO2 99%   BMI 39.15 kg/m   Vitals and nursing note reviewed  General: NAD, pleasant, able to participate in  exam Cardiac: RRR, normal heart sounds,Systolic murmur 2/6. 2+ radial and PT pulses bilaterally Respiratory: CTAB, normal effort, No wheezes, rales or rhonchi Abdomen: soft, nontender, nondistended, no hepatic or splenomegaly, +BS Extremities: no edema or cyanosis. WWP. Skin: warm and dry, no rashes noted Neuro: alert and oriented x4, no focal deficits Psych: Normal affect and mood   Assessment & Plan:   #Palpitations, chronic, worsening Patient 41 year old female who presented with worsening palpitations in the past month. Symptoms accompanied with shortness of breath. She denies chest pain and dizziness. EKG was normal. Given patient's history of heart murmur and worsening palpitation will order a Holter monitor and check labs. Palpitation could be secondary to arrhythmias. Hypothyroidism also be part our differential as well as electrolyte abnormalities. Patient denies increased caffeine intake or albuterol use. Is concerned his symptoms are secondary to stimulant. She'll will follow up with PCP with possible cardiology referral referral as needed if symptoms continue. --Order CBC, CMP, TSH --Order Holter monitor for 72 hours   Marjie Skiff, MD [employer] PGY-2

## 2017-06-29 NOTE — Patient Instructions (Addendum)
It was great seeing you today! We have addressed the following issues today  1. I will order a Holter monitor which hopefully will help [place] catch any cardiac event. You will get a call from the cardiology service. 2. You can make an appointment to follow up on the results of that test. 3. In the meantime we will do some blood test to check liver, kidney and thyroid function.  If we did any lab work today, and the results require attention, either me or my nurse will get in touch with you. If everything is normal, you will get a letter in mail and a message via . If you don't hear from [place] in two weeks, please give [place] a call. Otherwise, we look forward to seeing you again at your next visit. If you have any questions or concerns before then, please call the clinic at [phone].  Please bring all your medications to every doctors visit  Sign up for My Chart to have easy access to your labs results, and communication with your Primary care physician. Please ask Front Desk for some assistance.   Please check-out at the front desk before leaving the clinic.    Take Care,   Dr. Andy Gauss

## 2017-06-30 LAB — CMP14+EGFR
ALT: 18 IU/L (ref 0–32)
AST: 15 IU/L (ref 0–40)
Albumin/Globulin Ratio: 1.2 (ref 1.2–2.2)
Albumin: 3.9 g/dL (ref 3.5–5.5)
Alkaline Phosphatase: 63 IU/L (ref 39–117)
BUN/Creatinine Ratio: 11 (ref 9–23)
BUN: 8 mg/dL (ref 6–24)
Bilirubin Total: <0.2 mg/dL (ref 0.0–1.2)
CO2: 22 mmol/L (ref 20–29)
Calcium: 9.8 mg/dL (ref 8.7–10.2)
Chloride: 102 mmol/L (ref 96–106)
Creatinine, Ser: 0.75 mg/dL (ref 0.57–1.00)
GFR calc Af Amer: 114 mL/min/1.73
GFR calc non Af Amer: 99 mL/min/1.73
Globulin, Total: 3.3 g/dL (ref 1.5–4.5)
Glucose: 89 mg/dL (ref 65–99)
Potassium: 4.4 mmol/L (ref 3.5–5.2)
Sodium: 138 mmol/L (ref 134–144)
Total Protein: 7.2 g/dL (ref 6.0–8.5)

## 2017-06-30 LAB — CBC WITH DIFFERENTIAL/PLATELET
BASOS ABS: 0 10*3/uL (ref 0.0–0.2)
Basos: 0 %
EOS (ABSOLUTE): 0.1 10*3/uL (ref 0.0–0.4)
Eos: 1 %
Hematocrit: 38 % (ref 34.0–46.6)
Hemoglobin: 12.4 g/dL (ref 11.1–15.9)
IMMATURE GRANS (ABS): 0 10*3/uL (ref 0.0–0.1)
IMMATURE GRANULOCYTES: 0 %
LYMPHS: 27 %
Lymphocytes Absolute: 1.4 10*3/uL (ref 0.7–3.1)
MCH: 30.3 pg (ref 26.6–33.0)
MCHC: 32.6 g/dL (ref 31.5–35.7)
MCV: 93 fL (ref 79–97)
Monocytes Absolute: 0.5 10*3/uL (ref 0.1–0.9)
Monocytes: 9 %
NEUTROS PCT: 63 %
Neutrophils Absolute: 3.3 10*3/uL (ref 1.4–7.0)
PLATELETS: 307 10*3/uL (ref 150–379)
RBC: 4.09 x10E6/uL (ref 3.77–5.28)
RDW: 13.2 % (ref 12.3–15.4)
WBC: 5.2 10*3/uL (ref 3.4–10.8)

## 2017-06-30 LAB — TSH: TSH: 1.41 u[IU]/mL (ref 0.450–4.500)

## 2017-07-06 MED FILL — NORG-ETHIN ESTRA 0.25-0.035: 0.25-35 | 28 days supply | Qty: 28 | Fill #0

## 2017-07-11 ENCOUNTER — Emergency Department (HOSPITAL_COMMUNITY)
Admission: EM | Admit: 2017-07-11 | Discharge: 2017-07-11 | Disposition: A | Discharge status: Home / Self Care | Payer: 59 | Attending: Emergency Medicine | Admitting: Emergency Medicine

## 2017-07-11 ENCOUNTER — Encounter (HOSPITAL_COMMUNITY): Payer: Self-pay | Admitting: Emergency Medicine

## 2017-07-11 ENCOUNTER — Emergency Department (HOSPITAL_COMMUNITY): Payer: 59

## 2017-07-11 DIAGNOSIS — Y939 Activity, unspecified: Secondary | ICD-10-CM | POA: Insufficient documentation

## 2017-07-11 DIAGNOSIS — Y92410 Unspecified street and highway as the place of occurrence of the external cause: Secondary | ICD-10-CM | POA: Insufficient documentation

## 2017-07-11 DIAGNOSIS — M545 Low back pain, unspecified: Secondary | ICD-10-CM

## 2017-07-11 DIAGNOSIS — R0789 Other chest pain: Secondary | ICD-10-CM | POA: Diagnosis not present

## 2017-07-11 DIAGNOSIS — Z79899 Other long term (current) drug therapy: Secondary | ICD-10-CM | POA: Insufficient documentation

## 2017-07-11 DIAGNOSIS — Z91040 Latex allergy status: Secondary | ICD-10-CM | POA: Diagnosis not present

## 2017-07-11 DIAGNOSIS — Y999 Unspecified external cause status: Secondary | ICD-10-CM | POA: Insufficient documentation

## 2017-07-11 DIAGNOSIS — J45909 Unspecified asthma, uncomplicated: Secondary | ICD-10-CM | POA: Diagnosis not present

## 2017-07-11 DIAGNOSIS — S299XXA Unspecified injury of thorax, initial encounter: Secondary | ICD-10-CM | POA: Diagnosis not present

## 2017-07-11 MED ORDER — IBUPROFEN 200 MG PO TABS
600.0000 mg | ORAL_TABLET | Freq: Once | ORAL | Status: AC
  Administered 2017-07-11: 600 mg via ORAL
  Filled 2017-07-11: qty 3

## 2017-07-11 MED ORDER — CYCLOBENZAPRINE HCL 10 MG PO TABS: 10.0000 mg | ORAL_TABLET | Freq: Every evening | ORAL | 0 refills | Status: DC | PRN
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
## 2017-07-11 NOTE — Discharge Instructions (Signed)
Please read instructions below.  Talk with your PCP about any new medications, and follow up on your visit today. You can take advil every 6 hours for pain.  You can take flexeril at bedtime for muscle spasm. Return to ER if new numbness or tingling in your arms or legs, inability to urinate, inability to hold your bowels, or weakness in your extremities.

## 2017-07-11 NOTE — ED Notes (Signed)
Bed: [facility] Expected date:  Expected time:  Means of arrival:  Comments: 

## 2017-07-14 DIAGNOSIS — Z3141 Encounter for fertility testing: Secondary | ICD-10-CM | POA: Diagnosis not present

## 2017-07-14 DIAGNOSIS — Z319 Encounter for procreative management, unspecified: Secondary | ICD-10-CM | POA: Diagnosis not present

## 2017-07-14 DIAGNOSIS — N8500 Endometrial hyperplasia, unspecified: Secondary | ICD-10-CM | POA: Diagnosis not present

## 2017-07-14 MED FILL — DOXYCYCLINE HYCLATE 100 MG: 100 | 5 days supply | Qty: 10 | Fill #0

## 2017-07-17 ENCOUNTER — Other Ambulatory Visit: Payer: Self-pay | Admitting: Family Medicine

## 2017-07-17 ENCOUNTER — Telehealth: Payer: Self-pay | Admitting: Family Medicine

## 2017-07-17 DIAGNOSIS — R002 Palpitations: Secondary | ICD-10-CM

## 2017-07-17 NOTE — Telephone Encounter (Signed)
Will forward to MD who ordered. Jazmin Hartsell,CMA

## 2017-07-17 NOTE — Telephone Encounter (Signed)
New order placed for Holter monitor for 48 hr.

## 2017-07-17 NOTE — Telephone Encounter (Signed)
[facility] called because they need the orders for the patient Stacey Carlson and new one put to state either 24 hour, 48 hour, or event which is 30 days. They do not have the 72 hour Stacey Monitor. jw

## 2017-07-17 NOTE — Telephone Encounter (Signed)
Order changed to 48 hour holter and placed in [address] workqueue.  They will contact patient to schedule. Denym Christenberry,CMA

## 2017-07-20 MED FILL — ESTRADIOL 2 MG TABLET: 2 | 30 days supply | Qty: 60 | Fill #0

## 2017-07-31 MED FILL — ESTRADIOL 0.1 MG PATCH: 0.1 | 28 days supply | Qty: 16 | Fill #0

## 2017-08-02 ENCOUNTER — Ambulatory Visit (INDEPENDENT_AMBULATORY_CARE_PROVIDER_SITE_OTHER): Payer: 59

## 2017-08-02 DIAGNOSIS — N840 Polyp of corpus uteri: Secondary | ICD-10-CM | POA: Diagnosis not present

## 2017-08-02 DIAGNOSIS — Z3161 Procreative counseling and advice using natural family planning: Secondary | ICD-10-CM | POA: Diagnosis not present

## 2017-08-02 DIAGNOSIS — Z319 Encounter for procreative management, unspecified: Secondary | ICD-10-CM | POA: Diagnosis not present

## 2017-08-02 DIAGNOSIS — D250 Submucous leiomyoma of uterus: Secondary | ICD-10-CM | POA: Diagnosis not present

## 2017-08-02 DIAGNOSIS — Z48816 Encounter for surgical aftercare following surgery on the genitourinary system: Secondary | ICD-10-CM | POA: Diagnosis not present

## 2017-08-02 DIAGNOSIS — R002 Palpitations: Secondary | ICD-10-CM | POA: Diagnosis not present

## 2017-08-02 DIAGNOSIS — E288 Other ovarian dysfunction: Secondary | ICD-10-CM | POA: Diagnosis not present

## 2017-08-02 DIAGNOSIS — Z3141 Encounter for fertility testing: Secondary | ICD-10-CM | POA: Diagnosis not present

## 2017-08-02 DIAGNOSIS — Z3183 Encounter for assisted reproductive fertility procedure cycle: Secondary | ICD-10-CM | POA: Diagnosis not present

## 2017-08-02 DIAGNOSIS — N711 Chronic inflammatory disease of uterus: Secondary | ICD-10-CM | POA: Diagnosis not present

## 2017-08-10 DIAGNOSIS — Z3183 Encounter for assisted reproductive fertility procedure cycle: Secondary | ICD-10-CM | POA: Diagnosis not present

## 2017-08-18 DIAGNOSIS — Z3200 Encounter for pregnancy test, result unknown: Secondary | ICD-10-CM | POA: Diagnosis not present

## 2017-08-22 MED FILL — MENOPUR 75 UNIT VIAL: 75 | 10 days supply | Qty: 10 | Fill #0

## 2017-08-22 MED FILL — METHYLPREDNISOLONE 4 MG TAB: 4 | 4 days supply | Qty: 16 | Fill #0

## 2017-08-22 MED FILL — SHARPS COLLECTOR 1.4QT: 30 days supply | Qty: 1 | Fill #0

## 2017-08-22 MED FILL — DOXYCYCLINE HYCLATE 100 MG: 100 | 20 days supply | Qty: 40 | Fill #0

## 2017-08-22 MED FILL — GONAL-F 1,050 UNITS VIAL: 1050 | 30 days supply | Qty: 2 | Fill #0

## 2017-08-22 MED FILL — BD NEEDLES 30GX0.5: 30G X 1/2" | 20 days supply | Qty: 20 | Fill #0

## 2017-08-22 MED FILL — CLOMIPHENE CITRATE 50 MG TA: 50 | 5 days supply | Qty: 10 | Fill #0

## 2017-08-22 MED FILL — CETROTIDE 0.25 MG KIT: 0.25 | 4 days supply | Qty: 4 | Fill #0

## 2017-08-22 MED FILL — BD NEEDLES 30GX0.5": 30G X 1/2" | 20 days supply | Qty: 20 | Fill #0

## 2017-08-22 MED FILL — CHORIONIC GONAD 10,000 UNIT: 10000 | 1 days supply | Qty: 1 | Fill #0

## 2017-08-23 DIAGNOSIS — Z3183 Encounter for assisted reproductive fertility procedure cycle: Secondary | ICD-10-CM | POA: Diagnosis not present

## 2017-08-23 DIAGNOSIS — N979 Female infertility, unspecified: Secondary | ICD-10-CM | POA: Diagnosis not present

## 2017-08-29 DIAGNOSIS — Z3183 Encounter for assisted reproductive fertility procedure cycle: Secondary | ICD-10-CM | POA: Diagnosis not present

## 2017-08-29 DIAGNOSIS — N979 Female infertility, unspecified: Secondary | ICD-10-CM | POA: Diagnosis not present

## 2017-08-30 DIAGNOSIS — Z3183 Encounter for assisted reproductive fertility procedure cycle: Secondary | ICD-10-CM | POA: Diagnosis not present

## 2017-09-01 DIAGNOSIS — N979 Female infertility, unspecified: Secondary | ICD-10-CM | POA: Diagnosis not present

## 2017-09-01 DIAGNOSIS — Z3183 Encounter for assisted reproductive fertility procedure cycle: Secondary | ICD-10-CM | POA: Diagnosis not present

## 2017-09-04 DIAGNOSIS — N979 Female infertility, unspecified: Secondary | ICD-10-CM | POA: Diagnosis not present

## 2017-09-04 DIAGNOSIS — Z3183 Encounter for assisted reproductive fertility procedure cycle: Secondary | ICD-10-CM | POA: Diagnosis not present

## 2017-09-06 DIAGNOSIS — N979 Female infertility, unspecified: Secondary | ICD-10-CM | POA: Diagnosis not present

## 2017-09-06 DIAGNOSIS — Z3183 Encounter for assisted reproductive fertility procedure cycle: Secondary | ICD-10-CM | POA: Diagnosis not present

## 2017-09-08 DIAGNOSIS — Z3183 Encounter for assisted reproductive fertility procedure cycle: Secondary | ICD-10-CM | POA: Diagnosis not present

## 2017-09-08 DIAGNOSIS — Z3141 Encounter for fertility testing: Secondary | ICD-10-CM | POA: Diagnosis not present

## 2017-09-13 DIAGNOSIS — Z3183 Encounter for assisted reproductive fertility procedure cycle: Secondary | ICD-10-CM | POA: Diagnosis not present

## 2017-09-22 MED FILL — ESTRADIOL 0.1 MG PATCH: 0.1 | 28 days supply | Qty: 16 | Fill #1

## 2017-09-22 MED FILL — ESTRADIOL 2 MG TABLET: 2 | 30 days supply | Qty: 60 | Fill #1

## 2017-09-29 DIAGNOSIS — N979 Female infertility, unspecified: Secondary | ICD-10-CM | POA: Diagnosis not present

## 2017-09-29 DIAGNOSIS — Z3183 Encounter for assisted reproductive fertility procedure cycle: Secondary | ICD-10-CM | POA: Diagnosis not present

## 2017-10-02 MED FILL — METHYLPREDNISOLONE 4 MG TAB: 4 | 8 days supply | Qty: 32 | Fill #0

## 2017-10-02 MED FILL — PROGESTERONE OIL 50 MG/ML V: 50 | 30 days supply | Qty: 30 | Fill #0

## 2017-10-02 MED FILL — BD 3 ML SYRINGE 18GX1-1/2": 18G X 1-1/2 | 30 days supply | Qty: 30 | Fill #0

## 2017-10-02 MED FILL — BD NEEDLES 22GX1.5": 22G X 1-1/2 | 30 days supply | Qty: 30 | Fill #0

## 2017-10-02 MED FILL — BD NEEDLES 22GX1.5: 22G X 1-1/2 | 30 days supply | Qty: 30 | Fill #0

## 2017-10-02 MED FILL — BD 3 ML SYRINGE 18GX1-1/2: 18G X 1-1/2 | 30 days supply | Qty: 30 | Fill #0

## 2017-10-09 DIAGNOSIS — N8500 Endometrial hyperplasia, unspecified: Secondary | ICD-10-CM | POA: Diagnosis not present

## 2017-10-09 DIAGNOSIS — Z3141 Encounter for fertility testing: Secondary | ICD-10-CM | POA: Diagnosis not present

## 2017-10-09 MED FILL — DOXYCYCLINE HYCLATE 100 MG: 100 | 5 days supply | Qty: 10 | Fill #0

## 2017-10-16 MED FILL — ESTRADIOL 0.1 MG PATCH: 0.1 | 24 days supply | Qty: 16 | Fill #0

## 2017-10-16 MED FILL — ESTRADIOL 2 MG TABLET: 2 | 30 days supply | Qty: 60 | Fill #0

## 2017-10-26 DIAGNOSIS — N979 Female infertility, unspecified: Secondary | ICD-10-CM | POA: Diagnosis not present

## 2017-10-26 DIAGNOSIS — Z3183 Encounter for assisted reproductive fertility procedure cycle: Secondary | ICD-10-CM | POA: Diagnosis not present

## 2017-11-10 MED FILL — ESTRADIOL 0.1 MG PATCH: 0.1 | 24 days supply | Qty: 16 | Fill #1

## 2017-11-13 DIAGNOSIS — Z3200 Encounter for pregnancy test, result unknown: Secondary | ICD-10-CM | POA: Diagnosis not present

## 2017-11-13 DIAGNOSIS — N979 Female infertility, unspecified: Secondary | ICD-10-CM | POA: Diagnosis not present

## 2017-11-13 DIAGNOSIS — Z3183 Encounter for assisted reproductive fertility procedure cycle: Secondary | ICD-10-CM | POA: Diagnosis not present

## 2017-11-27 MED FILL — CETROTIDE 0.25 MG KIT: 0.25 | 4 days supply | Qty: 4 | Fill #0

## 2017-11-27 MED FILL — LETROZOLE 2.5 MG TABLET: 2.5 | 5 days supply | Qty: 15 | Fill #0

## 2017-11-27 MED FILL — DOXYCYCLINE HYCLATE 100 MG: 100 | 20 days supply | Qty: 40 | Fill #0

## 2017-11-27 MED FILL — MENOPUR 75 UNIT VIAL: 75 | 20 days supply | Qty: 20 | Fill #0

## 2017-11-27 MED FILL — PREGNYL 10,000 UNITS VIAL: 10000 | 1 days supply | Qty: 1 | Fill #0

## 2017-11-27 MED FILL — BD NEEDLES 22GX1.5": 22G X 1-1/2 | 30 days supply | Qty: 30 | Fill #1

## 2017-11-27 MED FILL — BD 3 ML SYRINGE 18GX1-1/2": 18G X 1-1/2 | 30 days supply | Qty: 60 | Fill #0

## 2017-11-27 MED FILL — BD NEEDLES 30GX0.5": 30G X 1/2" | 30 days supply | Qty: 30 | Fill #0

## 2017-11-27 MED FILL — BD NEEDLES 30GX0.5: 30G X 1/2" | 30 days supply | Qty: 30 | Fill #0

## 2017-11-27 MED FILL — BD NEEDLES 22GX1.5: 22G X 1-1/2 | 30 days supply | Qty: 30 | Fill #1

## 2017-11-27 MED FILL — BD 3 ML SYRINGE 18GX1-1/2: 18G X 1-1/2 | 30 days supply | Qty: 60 | Fill #0

## 2017-11-27 MED FILL — SHARPS COLLECTOR 1.4QT: 1 days supply | Qty: 1 | Fill #0

## 2017-11-27 MED FILL — GONAL-F 1,050 UNITS VIAL: 1050 | 30 days supply | Qty: 3 | Fill #0

## 2017-11-27 MED FILL — ESTRADIOL 2 MG TABLET: 2 | 30 days supply | Qty: 60 | Fill #2

## 2017-12-16 DIAGNOSIS — N979 Female infertility, unspecified: Secondary | ICD-10-CM | POA: Diagnosis not present

## 2017-12-16 DIAGNOSIS — Z3183 Encounter for assisted reproductive fertility procedure cycle: Secondary | ICD-10-CM | POA: Diagnosis not present

## 2017-12-16 DIAGNOSIS — Z3200 Encounter for pregnancy test, result unknown: Secondary | ICD-10-CM | POA: Diagnosis not present

## 2017-12-21 ENCOUNTER — Encounter: Payer: Self-pay | Admitting: Family Medicine

## 2017-12-22 DIAGNOSIS — Z3183 Encounter for assisted reproductive fertility procedure cycle: Secondary | ICD-10-CM | POA: Diagnosis not present

## 2017-12-22 DIAGNOSIS — Z3200 Encounter for pregnancy test, result unknown: Secondary | ICD-10-CM | POA: Diagnosis not present

## 2017-12-22 DIAGNOSIS — N979 Female infertility, unspecified: Secondary | ICD-10-CM | POA: Diagnosis not present

## 2017-12-27 ENCOUNTER — Encounter: Payer: Self-pay | Admitting: Family Medicine

## 2017-12-27 ENCOUNTER — Ambulatory Visit (INDEPENDENT_AMBULATORY_CARE_PROVIDER_SITE_OTHER): Payer: No Typology Code available for payment source | Admitting: Family Medicine

## 2017-12-27 ENCOUNTER — Other Ambulatory Visit (HOSPITAL_COMMUNITY)
Admission: RE | Admit: 2017-12-27 | Discharge: 2017-12-27 | Disposition: A | Discharge status: Home / Self Care | Payer: No Typology Code available for payment source | Source: Ambulatory Visit | Attending: Family Medicine | Admitting: Family Medicine

## 2017-12-27 ENCOUNTER — Other Ambulatory Visit: Payer: Self-pay

## 2017-12-27 VITALS — BP 128/68 | HR 72 | Temp 98.2°F | Ht 63.0 in | Wt 229.0 lb

## 2017-12-27 DIAGNOSIS — Z113 Encounter for screening for infections with a predominantly sexual mode of transmission: Secondary | ICD-10-CM | POA: Diagnosis not present

## 2017-12-27 DIAGNOSIS — Z6841 Body Mass Index (BMI) 40.0 and over, adult: Secondary | ICD-10-CM

## 2017-12-27 DIAGNOSIS — Z01419 Encounter for gynecological examination (general) (routine) without abnormal findings: Secondary | ICD-10-CM | POA: Diagnosis not present

## 2017-12-27 DIAGNOSIS — Z3183 Encounter for assisted reproductive fertility procedure cycle: Secondary | ICD-10-CM | POA: Diagnosis not present

## 2017-12-27 DIAGNOSIS — J3089 Other allergic rhinitis: Secondary | ICD-10-CM

## 2017-12-27 DIAGNOSIS — Z3200 Encounter for pregnancy test, result unknown: Secondary | ICD-10-CM | POA: Diagnosis not present

## 2017-12-27 DIAGNOSIS — Z124 Encounter for screening for malignant neoplasm of cervix: Secondary | ICD-10-CM | POA: Diagnosis not present

## 2017-12-27 DIAGNOSIS — N979 Female infertility, unspecified: Secondary | ICD-10-CM | POA: Diagnosis not present

## 2017-12-27 DIAGNOSIS — J302 Other seasonal allergic rhinitis: Secondary | ICD-10-CM

## 2017-12-27 MED ORDER — ALBUTEROL SULFATE HFA 108 (90 BASE) MCG/ACT IN AERS: 1.0000 | INHALATION_SPRAY | RESPIRATORY_TRACT | 1 refills | Status: DC | PRN

## 2017-12-27 MED ORDER — OLOPATADINE HCL 0.2 % OP SOLN: 1.0000 [drp] | OPHTHALMIC | 11 refills | Status: DC | PRN

## 2017-12-27 MED ORDER — FLUTICASONE PROPIONATE 50 MCG/ACT NA SUSP: 2.0000 | Freq: Every day | NASAL | 11 refills | Status: DC

## 2017-12-27 MED FILL — PROMETHAZINE 12.5 MG TABLET: 12.5 | 3 days supply | Qty: 10 | Fill #0

## 2017-12-27 MED FILL — OXYCODONE-ACETAMINOPHEN 5-3: 5-325 | 3 days supply | Qty: 10 | Fill #0

## 2017-12-27 NOTE — Patient Instructions (Addendum)
It was great seeing you today! Good luck with your IVF procedures coming up.  We will inform you of your lab results.  If you have questions or concerns please do not hesitate to call at [phone].  Lucila Maine, DO PGY-2, [facility] 12/27/2017 3:25 PM   Health Maintenance, Female Adopting a healthy lifestyle and getting preventive care can go a long way to promote health and wellness. Talk with your health care provider about what schedule of regular examinations is right for you. This is a good chance for you to check in with your provider about disease prevention and staying healthy. In between checkups, there are plenty of things you can do on your own. Experts have done a lot of research about which lifestyle changes and preventive measures are most likely to keep you healthy. Ask your health care provider for more information. Weight and diet Eat a healthy diet  Be sure to include plenty of vegetables, fruits, low-fat dairy products, and lean protein.  Do not eat a lot of foods high in solid fats, added sugars, or salt.  Get regular exercise. This is one of the most important things you can do for your health. ? Most adults should exercise for at least 150 minutes each week. The exercise should increase your heart rate and make you sweat (moderate-intensity exercise). ? Most adults should also do strengthening exercises at least twice a week. This is in addition to the moderate-intensity exercise.  Maintain a healthy weight  Body mass index (BMI) is a measurement that can be used to identify possible weight problems. It estimates body fat based on height and weight. Your health care provider can help determine your BMI and help you achieve or maintain a healthy weight.  For females 26 years of age and older: ? A BMI below 18.5 is considered underweight. ? A BMI of 18.5 to 24.9 is normal. ? A BMI of 25 to 29.9 is considered overweight. ? A BMI of 30 and  above is considered obese.  Watch levels of cholesterol and blood lipids  You should start having your blood tested for lipids and cholesterol at 44 years of age, then have this test every 5 years.  You may need to have your cholesterol levels checked more often if: ? Your lipid or cholesterol levels are high. ? You are older than 44 years of age. ? You are at high risk for heart disease.  Cancer screening Lung Cancer  Lung cancer screening is recommended for adults 17-70 years old who are at high risk for lung cancer because of a history of smoking.  A yearly low-dose CT scan of the lungs is recommended for people who: ? Currently smoke. ? Have quit within the past 15 years. ? Have at least a 30-pack-year history of smoking. A pack year is smoking an average of one pack of cigarettes a day for 1 year.  Yearly screening should continue until it has been 15 years since you quit.  Yearly screening should stop if you develop a health problem that would prevent you from having lung cancer treatment.  Breast Cancer  Practice breast self-awareness. This means understanding how your breasts normally appear and feel.  It also means doing regular breast self-exams. Let your health care provider know about any changes, no matter how small.  If you are in your 20s or 30s, you should have a clinical breast exam (CBE) by a health care provider every 1-3 years as  part of a regular health exam.  If you are 40 or older, have a CBE every year. Also consider having a breast X-ray (mammogram) every year.  If you have a family history of breast cancer, talk to your health care provider about genetic screening.  If you are at high risk for breast cancer, talk to your health care provider about having an MRI and a mammogram every year.  Breast cancer gene (BRCA) assessment is recommended for women who have family members with BRCA-related cancers. BRCA-related cancers  include: ? Breast. ? Ovarian. ? Tubal. ? Peritoneal cancers.  Results of the assessment will determine the need for genetic counseling and BRCA1 and BRCA2 testing.  Cervical Cancer Your health care provider may recommend that you be screened regularly for cancer of the pelvic organs (ovaries, uterus, and vagina). This screening involves a pelvic examination, including checking for microscopic changes to the surface of your cervix (Pap test). You may be encouraged to have this screening done every 3 years, beginning at age 66.  For women ages 23-65, health care providers may recommend pelvic exams and Pap testing every 3 years, or they may recommend the Pap and pelvic exam, combined with testing for human papilloma virus (HPV), every 5 years. Some types of HPV increase your risk of cervical cancer. Testing for HPV may also be done on women of any age with unclear Pap test results.  Other health care providers may not recommend any screening for nonpregnant women who are considered low risk for pelvic cancer and who do not have symptoms. Ask your health care provider if a screening pelvic exam is right for you.  If you have had past treatment for cervical cancer or a condition that could lead to cancer, you need Pap tests and screening for cancer for at least 20 years after your treatment. If Pap tests have been discontinued, your risk factors (such as having a new sexual partner) need to be reassessed to determine if screening should resume. Some women have medical problems that increase the chance of getting cervical cancer. In these cases, your health care provider may recommend more frequent screening and Pap tests.  Colorectal Cancer  This type of cancer can be detected and often prevented.  Routine colorectal cancer screening usually begins at 44 years of age and continues through 44 years of age.  Your health care provider may recommend screening at an earlier age if you have risk factors  for colon cancer.  Your health care provider may also recommend using home test kits to check for hidden blood in the stool.  A small camera at the end of a tube can be used to examine your colon directly (sigmoidoscopy or colonoscopy). This is done to check for the earliest forms of colorectal cancer.  Routine screening usually begins at age 84.  Direct examination of the colon should be repeated every 5-10 years through 44 years of age. However, you may need to be screened more often if early forms of precancerous polyps or small growths are found.  Skin Cancer  Check your skin from head to toe regularly.  Tell your health care provider about any new moles or changes in moles, especially if there is a change in a mole's shape or color.  Also tell your health care provider if you have a mole that is larger than the size of a pencil eraser.  Always use sunscreen. Apply sunscreen liberally and repeatedly throughout the day.  Protect yourself by wearing  long sleeves, pants, a wide-brimmed hat, and sunglasses whenever you are outside.  Heart disease, diabetes, and high blood pressure  High blood pressure causes heart disease and increases the risk of stroke. High blood pressure is more likely to develop in: ? People who have blood pressure in the high end of the normal range (130-139/85-89 mm Hg). ? People who are overweight or obese. ? People who are African American.  If you are 84-53 years of age, have your blood pressure checked every 3-5 years. If you are 36 years of age or older, have your blood pressure checked every year. You should have your blood pressure measured twice-once when you are at a hospital or clinic, and once when you are not at a hospital or clinic. Record the average of the two measurements. To check your blood pressure when you are not at a hospital or clinic, you can use: ? An automated blood pressure machine at a pharmacy. ? A home blood pressure monitor.  If  you are between 81 years and 46 years old, ask your health care provider if you should take aspirin to prevent strokes.  Have regular diabetes screenings. This involves taking a blood sample to check your fasting blood sugar level. ? If you are at a normal weight and have a low risk for diabetes, have this test once every three years after 44 years of age. ? If you are overweight and have a high risk for diabetes, consider being tested at a younger age or more often. Preventing infection Hepatitis B  If you have a higher risk for hepatitis B, you should be screened for this virus. You are considered at high risk for hepatitis B if: ? You were born in a country where hepatitis B is common. Ask your health care provider which countries are considered high risk. ? Your parents were born in a high-risk country, and you have not been immunized against hepatitis B (hepatitis B vaccine). ? You have HIV or AIDS. ? You use needles to inject street drugs. ? You live with someone who has hepatitis B. ? You have had sex with someone who has hepatitis B. ? You get hemodialysis treatment. ? You take certain medicines for conditions, including cancer, organ transplantation, and autoimmune conditions.  Hepatitis C  Blood testing is recommended for: ? Everyone born from [birth year] through 1967. ? Anyone with known risk factors for hepatitis C.  Sexually transmitted infections (STIs)  You should be screened for sexually transmitted infections (STIs) including gonorrhea and chlamydia if: ? You are sexually active and are younger than 44 years of age. ? You are older than 44 years of age and your health care provider tells you that you are at risk for this type of infection. ? Your sexual activity has changed since you were last screened and you are at an increased risk for chlamydia or gonorrhea. Ask your health care provider if you are at risk.  If you do not have HIV, but are at risk, it may be recommended  that you take a prescription medicine daily to prevent HIV infection. This is called pre-exposure prophylaxis (PrEP). You are considered at risk if: ? You are sexually active and do not regularly use condoms or know the HIV status of your partner(s). ? You take drugs by injection. ? You are sexually active with a partner who has HIV.  Talk with your health care provider about whether you are at high risk of being infected with HIV.  If you choose to begin PrEP, you should first be tested for HIV. You should then be tested every 3 months for as long as you are taking PrEP. Pregnancy  If you are premenopausal and you may become pregnant, ask your health care provider about preconception counseling.  If you may become pregnant, take 400 to 800 micrograms (mcg) of folic acid every day.  If you want to prevent pregnancy, talk to your health care provider about birth control (contraception). Osteoporosis and menopause  Osteoporosis is a disease in which the bones lose minerals and strength with aging. This can result in serious bone fractures. Your risk for osteoporosis can be identified using a bone density scan.  If you are 24 years of age or older, or if you are at risk for osteoporosis and fractures, ask your health care provider if you should be screened.  Ask your health care provider whether you should take a calcium or vitamin D supplement to lower your risk for osteoporosis.  Menopause may have certain physical symptoms and risks.  Hormone replacement therapy may reduce some of these symptoms and risks. Talk to your health care provider about whether hormone replacement therapy is right for you. Follow these instructions at home:  Schedule regular health, dental, and eye exams.  Stay current with your immunizations.  Do not use any tobacco products including cigarettes, chewing tobacco, or electronic cigarettes.  If you are pregnant, do not drink alcohol.  If you are  breastfeeding, limit how much and how often you drink alcohol.  Limit alcohol intake to no more than 1 drink per day for nonpregnant women. One drink equals 12 ounces of beer, 5 ounces of wine, or 1 ounces of hard liquor.  Do not use street drugs.  Do not share needles.  Ask your health care provider for help if you need support or information about quitting drugs.  Tell your health care provider if you often feel depressed.  Tell your health care provider if you have ever been abused or do not feel safe at home. This information is not intended to replace advice given to you by your health care provider. Make sure you discuss any questions you have with your health care provider. Document Released: 05/02/2011 Document Revised: 03/24/2016 Document Reviewed: 07/21/2015 Elsevier Interactive Patient Education  [employer].

## 2017-12-27 NOTE — Progress Notes (Signed)
    Subjective:    Patient ID: Stacey Carlson, female    DOB: 1976-10-31, 42 y.o.   MRN: [id number]   CC: annual physical exam  Doing well. Concerns today include: refills on allergy medications, difficulty losing weight.  Due for pap smear. She is currently undergoing IVF therapy and is worried getting pap will interfere with the process of getting pregnant. She is scheduled for egg retrieval this Friday. This is her third attempt.   Smoking status reviewed- non-smoker  Review of Systems- endorses weight gain. Denies CP, SOB, DOE, fevers, chills, nausea, vomiting, constipation, or diarrhea.    Objective:  BP 128/68   Pulse 72   Temp 98.2 F (36.8 C)   Ht 5\' 3"  (1.6 m)   Wt 229 lb (103.9 kg)   LMP 12/16/2017 (Exact Date)   SpO2 99%   BMI 40.57 kg/m  Vitals and nursing note reviewed  General: well nourished, in no acute distress Cardiac: RRR, clear S1 and S2, no murmurs, rubs, or gallops Respiratory: clear to auscultation bilaterally, no increased work of breathing Abdomen: soft, nontender, nondistended, no masses or organomegaly. Bowel sounds present GU: normal female external genitalia. Moderate amount of discharge present in vaginal canal. Cervix is posterior, pink, without visible lesions. No CMT. Extremities: no edema or cyanosis. Skin: warm and dry, no rashes noted Neuro: alert and oriented, no focal deficits   Assessment & Plan:    1. 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 Colpo was negative and she was advised to return in 2017 for repeat pap however this did not happen. Pap done today. Will inform patient of results.  - Cytology - PAP()  2. Class 3 severe obesity due to excess calories without serious comorbidity with body mass index (BMI) of 40.0 to 44.9 in adult [facility]) Discussed calorie counting, portion control, and information to set up appointment with our nutritionist here was given to patient.  Encouraged continued physical activity.   4. Other allergic rhinitis Refilled prescriptions today for allergies - fluticasone (FLONASE) 50 MCG/ACT nasal spray; Place 2 sprays into both nostrils daily. Per nostril  Dispense: 16 g; Refill: 11 - Olopatadine HCl (PATADAY) 0.2 % SOLN; Apply 1 drop to eye as needed. For allergies  Dispense: 2.5 mL; Refill: 11   Return in about 1 year (around 12/27/2018), or as needed.   Lucila Maine, DO Family Medicine Resident PGY-2

## 2017-12-29 LAB — CYTOLOGY - PAP: Diagnosis: NEGATIVE

## 2018-01-01 ENCOUNTER — Encounter: Payer: Self-pay | Admitting: *Deleted

## 2018-01-01 MED FILL — LARIN 21 1-20 TABLET: 1-20 | 21 days supply | Qty: 21 | Fill #0

## 2018-01-01 NOTE — Progress Notes (Signed)
Please let Ms. Porath know her pap smear was normal. Thank you!

## 2018-01-11 MED FILL — VENTOLIN HFA 90 MCG INHALER: 108 (90 BAS | 16 days supply | Qty: 18 | Fill #0

## 2018-01-31 ENCOUNTER — Encounter: Payer: Self-pay | Admitting: Family Medicine

## 2018-03-20 MED FILL — DOXYCYCLINE HYCLATE 100 MG: 100 | 20 days supply | Qty: 40 | Fill #0

## 2018-03-20 MED FILL — BD NEEDLES 30GX0.5: 30G X 1/2" | 30 days supply | Qty: 30 | Fill #0

## 2018-03-20 MED FILL — MENOPUR 75 UNIT VIAL: 75 | 10 days supply | Qty: 20 | Fill #0

## 2018-03-20 MED FILL — BD 3 ML SYRINGE 18GX1-1/2: 18G X 1-1/2 | 30 days supply | Qty: 30 | Fill #0

## 2018-03-20 MED FILL — BD NEEDLES 30GX0.5": 30G X 1/2" | 30 days supply | Qty: 30 | Fill #0

## 2018-03-20 MED FILL — CLOMIPHENE CITRATE 50 MG TA: 50 | 5 days supply | Qty: 10 | Fill #0

## 2018-03-20 MED FILL — BD 3 ML SYRINGE 18GX1-1/2": 18G X 1-1/2 | 30 days supply | Qty: 30 | Fill #0

## 2018-03-20 MED FILL — GONAL-F 1,050 UNITS VIAL: 1050 | 3 days supply | Qty: 3 | Fill #0

## 2018-03-20 MED FILL — NOVAREL 5000 UNIT SOLR: 5000 | 1 days supply | Qty: 2 | Fill #0

## 2018-04-10 MED FILL — ESTRADIOL 0.1 MG PATCH: 0.1 | 28 days supply | Qty: 8 | Fill #0

## 2018-04-10 MED FILL — OLOPATADINE HCL 0.2 % SOLN: 0.2 | 25 days supply | Qty: 3 | Fill #0

## 2018-04-10 MED FILL — FLUTICASONE PROP 50 MCG SPR: 50 | 30 days supply | Qty: 16 | Fill #0

## 2018-05-15 ENCOUNTER — Encounter: Payer: Self-pay | Admitting: Family Medicine

## 2018-05-15 ENCOUNTER — Ambulatory Visit (INDEPENDENT_AMBULATORY_CARE_PROVIDER_SITE_OTHER): Payer: No Typology Code available for payment source | Admitting: Family Medicine

## 2018-05-15 ENCOUNTER — Other Ambulatory Visit: Payer: Self-pay

## 2018-05-15 DIAGNOSIS — H5789 Other specified disorders of eye and adnexa: Secondary | ICD-10-CM

## 2018-05-15 DIAGNOSIS — L84 Corns and callosities: Secondary | ICD-10-CM

## 2018-05-15 NOTE — Progress Notes (Deleted)
   Subjective:   Patient ID: Primitivo Gauze    DOB: Dec 13, 1975, 42 y.o. female   MRN: [id number]  CC: ***  HPI: Stacey Carlson is a 42 y.o. female who presents to clinic today ***. Problems discussed today are as follows:  ROS: See HPI for pertinent ROS.  [place]: Pertinent past medical, surgical, family, and social history were reviewed and updated as appropriate. Smoking status reviewed.  Medications reviewed.  Objective:   BP 104/72   Pulse 88   Temp 98.2 F (36.8 C) (Oral)   Wt 234 lb (106.1 kg)   LMP 04/25/2018   SpO2 99%   BMI 41.45 kg/m  Vitals and nursing note reviewed.  General: well nourished, well developed, in no acute distress with non-toxic appearance HEENT: normocephalic, atraumatic, moist mucous membranes Neck: supple, non-tender without lymphadenopathy CV: regular rate and rhythm without murmurs rubs or gallops Lungs: clear to auscultation bilaterally with normal work of breathing Abdomen: soft, non-tender, no masses or organomegaly palpable, normoactive bowel sounds Skin: warm, dry, no rashes or lesions, cap refill < 2 seconds Extremities: warm and well perfused, normal tone  Assessment & Plan:   No problem-specific Assessment & Plan notes found for this encounter.  No orders of the defined types were placed in this encounter.  No orders of the defined types were placed in this encounter.   Lovenia Kim, MD [place], PGY-2 05/15/2018 3:29 PM

## 2018-05-15 NOTE — Patient Instructions (Signed)
It was nice meeting you today.  You were seen in clinic for left eye and right great toe pain.  I examined your eye and there is no evidence of a foreign body, however I would recommend if it does not continue to get better over the next day or so to see your eye doctor for a more thorough evaluation.    If the sensation returns, you can try rinsing your eye out with lukewarm water to flush it.  Additionally, I would recommend using Visine to keep your eye well moisturized.  For your big toe pain, I suspect this is due to a callus on the tip of your toe.  As we discussed, this can sometimes form that pressure points when wearing tight fitting shoes.  I recommend using a callus remover such as Dr. Felicie Morn, which you can find over-the-counter at your local pharmacy.  Call clinic if you have any questions.  Be well, Lovenia Kim MD
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
## 2018-05-24 DIAGNOSIS — H5789 Other specified disorders of eye and adnexa: Secondary | ICD-10-CM | POA: Insufficient documentation

## 2018-05-24 DIAGNOSIS — L84 Corns and callosities: Secondary | ICD-10-CM | POA: Insufficient documentation
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
## 2018-05-24 NOTE — Assessment & Plan Note (Signed)
Unclear etiology, improving.  Exam of patient's left eye revealed no visible lesions, foreign body, or discharge. Patient given the option for referral to ophthalmology for further evaluation. Patient is reassured by her improving symptoms and benign gross examination; she does not wish for referral at this time.  Return precautions discussed.

## 2018-06-11 MED FILL — metroNIDAZOLE 500 MG TABS: 500 | 10 days supply | Qty: 20 | Fill #0

## 2018-06-11 MED FILL — CIPROFLOXACIN HCL 500 MG TA: 500 | 10 days supply | Qty: 20 | Fill #0

## 2018-06-11 MED FILL — MEDROXYPROGESTERONE 10 MG T: 10 | 5 days supply | Qty: 5 | Fill #0

## 2018-06-26 MED FILL — ESTRADIOL 0.1 MG PATCH: 0.1 | 28 days supply | Qty: 8 | Fill #0

## 2018-06-26 MED FILL — ESTRADIOL 2 MG TABLET: 2 | 30 days supply | Qty: 60 | Fill #0

## 2018-07-12 MED FILL — MEDROXYPROGESTERONE 10 MG T: 10 | 5 days supply | Qty: 5 | Fill #0

## 2018-07-12 MED FILL — ESTRADIOL 2 MG TABLET: 2 | 30 days supply | Qty: 120 | Fill #0

## 2018-07-12 MED FILL — DOXYCYCLINE HYCLATE 100 MG: 100 | 5 days supply | Qty: 10 | Fill #0

## 2018-08-15 ENCOUNTER — Ambulatory Visit (INDEPENDENT_AMBULATORY_CARE_PROVIDER_SITE_OTHER): Payer: No Typology Code available for payment source | Admitting: Family Medicine

## 2018-08-15 ENCOUNTER — Encounter: Payer: Self-pay | Admitting: Family Medicine

## 2018-08-15 ENCOUNTER — Other Ambulatory Visit: Payer: Self-pay

## 2018-08-15 VITALS — BP 102/70 | HR 76 | Temp 98.2°F | Ht 63.0 in | Wt 239.2 lb

## 2018-08-15 DIAGNOSIS — L97529 Non-pressure chronic ulcer of other part of left foot with unspecified severity: Secondary | ICD-10-CM | POA: Diagnosis not present

## 2018-08-15 DIAGNOSIS — M19079 Primary osteoarthritis, unspecified ankle and foot: Secondary | ICD-10-CM | POA: Diagnosis not present

## 2018-08-15 NOTE — Assessment & Plan Note (Addendum)
Suspect this is the cause of her recurrent episodes of "stubbing" this toe.  Might consider orthotic or foot surgeion evaluation as this is causing issues with  gait.
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
## 2018-08-15 NOTE — Patient Instructions (Signed)
If you have noy heard back from me regarding your X ray results by [facility] Friday please give my office a call. Noce to met yoU!
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
## 2018-08-16 ENCOUNTER — Ambulatory Visit (HOSPITAL_COMMUNITY)
Admission: RE | Admit: 2018-08-16 | Discharge: 2018-08-16 | Disposition: A | Discharge status: Home / Self Care | Payer: No Typology Code available for payment source | Source: Ambulatory Visit | Attending: Family Medicine | Admitting: Family Medicine

## 2018-08-16 DIAGNOSIS — M868X7 Other osteomyelitis, ankle and foot: Secondary | ICD-10-CM | POA: Insufficient documentation

## 2018-08-16 DIAGNOSIS — L97529 Non-pressure chronic ulcer of other part of left foot with unspecified severity: Secondary | ICD-10-CM | POA: Diagnosis not present

## 2018-08-17 ENCOUNTER — Other Ambulatory Visit: Payer: Self-pay | Admitting: Family Medicine

## 2018-08-17 ENCOUNTER — Encounter: Payer: Self-pay | Admitting: Family Medicine

## 2018-08-17 DIAGNOSIS — M86272 Subacute osteomyelitis, left ankle and foot: Secondary | ICD-10-CM

## 2018-08-17 MED FILL — ESTRADIOL 2 MG TABLET: 2 | 30 days supply | Qty: 120 | Fill #1

## 2018-08-17 NOTE — Progress Notes (Signed)
Discussed x-ray findings which are consistent with osteomyelitis left toe.  This had been 1 of our concerns.  Discussed with her by phone.  She has previously  been followed by Dr. Mardelle Matte at [facility] for her knee and would like to see him for this as well.  Clearly this is been going on for several weeks to months so I do think she needs to be seen urgently.  I have placed referral to get her in early next week.  She will call if they have not contacted her by Tuesday.  I will let them choose abx therapy.  She is undergoing in vitro fertilization process and I have reminded her to mention that to the orthopedist as  they decide treatment options including antibiotic choices.

## 2018-08-17 NOTE — Progress Notes (Signed)
Ref ortho

## 2018-08-30 ENCOUNTER — Other Ambulatory Visit (HOSPITAL_COMMUNITY): Payer: Self-pay | Admitting: Orthopedic Surgery

## 2018-08-30 DIAGNOSIS — M79672 Pain in left foot: Secondary | ICD-10-CM

## 2018-09-03 MED FILL — ZARXIO 300 MCG/0.5ML SOSY: 300 | 1 days supply | Qty: 1 | Fill #0

## 2018-09-04 ENCOUNTER — Ambulatory Visit (HOSPITAL_COMMUNITY)
Admission: RE | Admit: 2018-09-04 | Discharge: 2018-09-04 | Disposition: A | Discharge status: Home / Self Care | Payer: No Typology Code available for payment source | Source: Ambulatory Visit | Attending: Orthopedic Surgery | Admitting: Orthopedic Surgery

## 2018-09-04 DIAGNOSIS — M79672 Pain in left foot: Secondary | ICD-10-CM | POA: Diagnosis not present

## 2018-09-04 DIAGNOSIS — R609 Edema, unspecified: Secondary | ICD-10-CM | POA: Insufficient documentation

## 2018-09-13 MED FILL — ESTRADIOL 0.1 MG PATCH: 0.1 | 28 days supply | Qty: 8 | Fill #1

## 2018-09-13 MED FILL — PROGESTERONE OIL 50 MG/ML V: 50 | 30 days supply | Qty: 30 | Fill #0

## 2018-10-11 MED FILL — traMADol HCL 50 MG TABS: 50 | 2 days supply | Qty: 10 | Fill #0

## 2018-10-11 MED FILL — PROMETHAZINE 12.5 MG TABLET: 12.5 | 2 days supply | Qty: 10 | Fill #0

## 2018-11-06 ENCOUNTER — Telehealth: Payer: Self-pay | Admitting: Family Medicine

## 2018-11-06 DIAGNOSIS — L97529 Non-pressure chronic ulcer of other part of left foot with unspecified severity: Secondary | ICD-10-CM

## 2018-11-06 NOTE — Telephone Encounter (Signed)
Will forward to MD. Stacey Carlson,CMA  

## 2018-11-06 NOTE — Telephone Encounter (Signed)
Pt left a voicemail on the nurse line stating that she needs a referral to go to [facility] for her left toe. jw

## 2018-11-07 NOTE — Telephone Encounter (Signed)
  Returned call to patient. She was seen in October and there was concern for osteo of her toe. She had gone to ortho (Dr. Mardelle Matte) who was unsure if it was infected, so she then was referred to another ortho who recommended she see dermatology. She has appt on 1/13 but needs referral.   Lucila Maine, DO PGY-3, [place] [facility] 11/07/2018 10:09 AM

## 2018-11-22 MED FILL — NOVAREL 10,000 UNITS VIAL: 10000 | 1 days supply | Qty: 1 | Fill #0

## 2018-11-22 MED FILL — BD NEEDLES 30GX0.5": 30G X 1/2" | 30 days supply | Qty: 30 | Fill #0

## 2018-11-22 MED FILL — DOXYCYCLINE HYCLATE 100 MG: 100 | 20 days supply | Qty: 40 | Fill #0

## 2018-11-22 MED FILL — BD NEEDLES 30GX0.5: 30G X 1/2" | 30 days supply | Qty: 30 | Fill #0

## 2018-11-22 MED FILL — BD 3 ML SYRINGE 18GX1-1/2": 18G X 1-1/2 | 30 days supply | Qty: 30 | Fill #0

## 2018-11-22 MED FILL — GONAL-F 1,050 UNITS VIAL: 1050 | 14 days supply | Qty: 4 | Fill #0

## 2018-11-22 MED FILL — MENOPUR 75 UNIT VIAL: 75 | 25 days supply | Qty: 25 | Fill #0

## 2018-11-22 MED FILL — CLOMIPHENE CITRATE 50 MG TA: 50 | 5 days supply | Qty: 10 | Fill #0

## 2018-11-22 MED FILL — BD 3 ML SYRINGE 18GX1-1/2: 18G X 1-1/2 | 30 days supply | Qty: 30 | Fill #0

## 2018-12-15 IMAGING — MR MR FOOT*L* W/O CM
4 of 5 series · 13 of 40 positions shown · non-contrast
Comparison: None.

CLINICAL DATA: Left great toe infection. Possible oozing from the
end of the toe for 1 year.

EXAM:
MRI OF THE LEFT FOOT WITHOUT CONTRAST
TECHNIQUE: Multiplanar, multisequence MR imaging of the left forefoot was
performed. No intravenous contrast was administered.

[Series 4: T1 · coronal · 3.0mm · 0.16mm/px · 4 of 36 slices shown (1 of 2)]
[im 1/36]
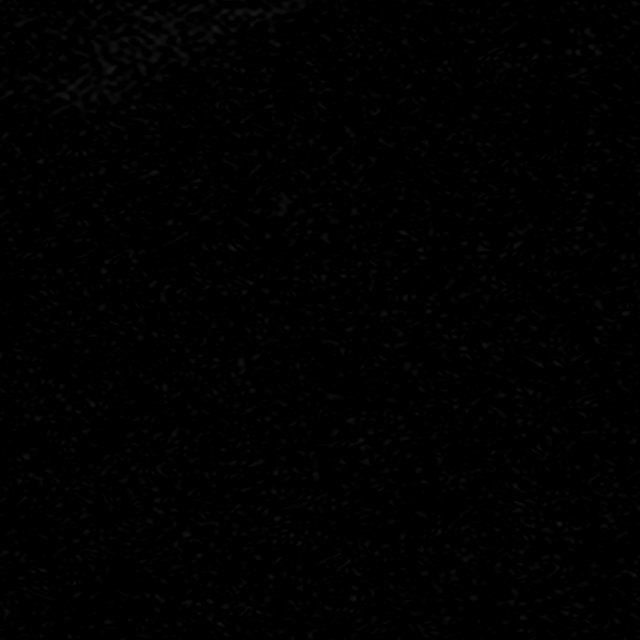
[im 4/36]
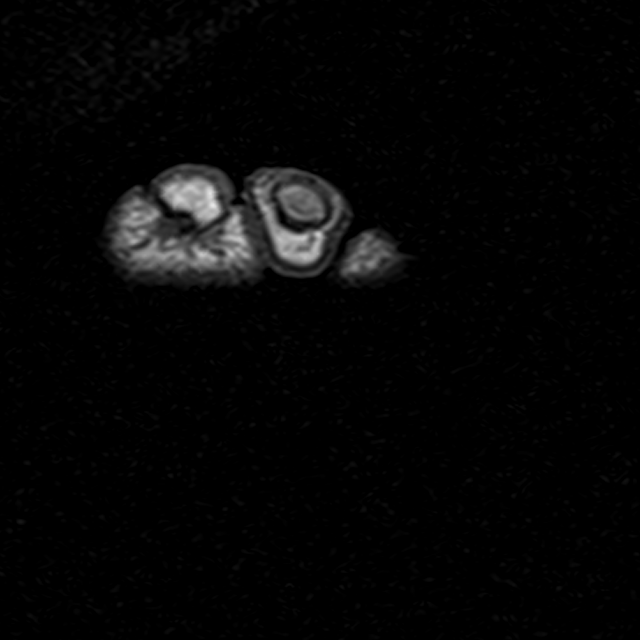
[im 18/36]
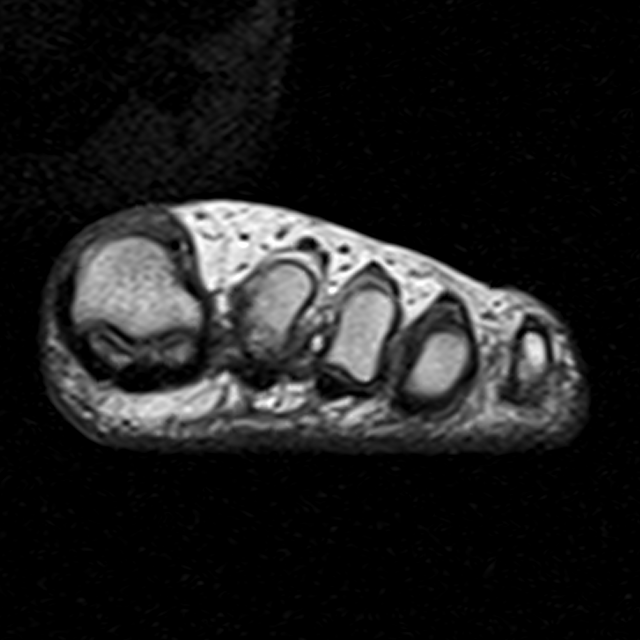
[im 32/36]
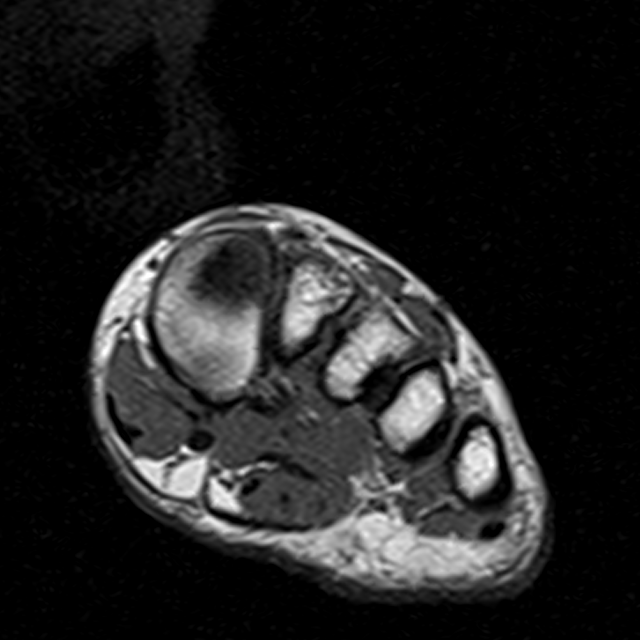

[Series 5: t2fs axial · coronal · 3.0mm · 0.16mm/px · 3 of 36 slices shown]
[im 4/36]
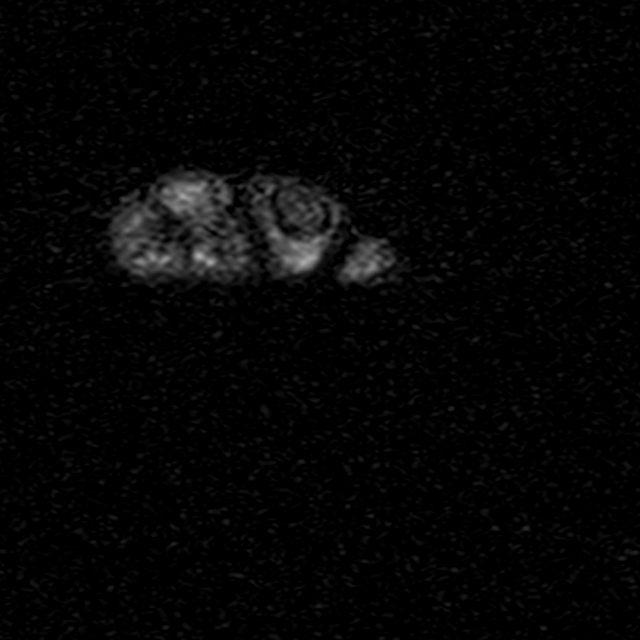
[im 18/36]
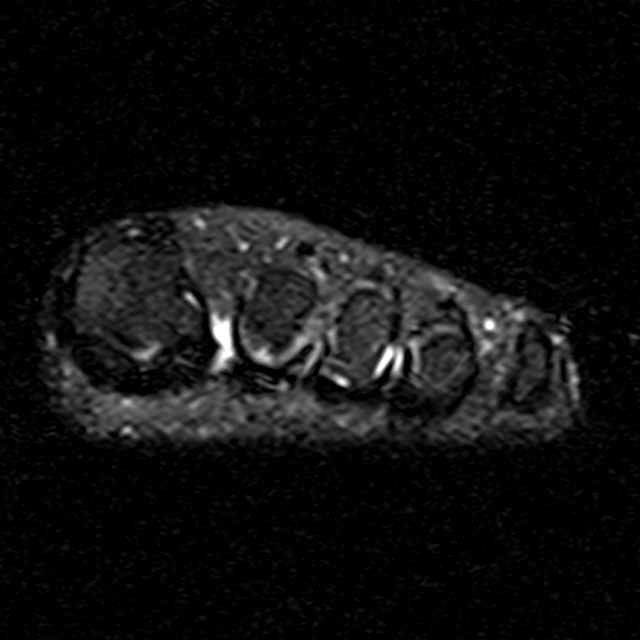
[im 32/36]
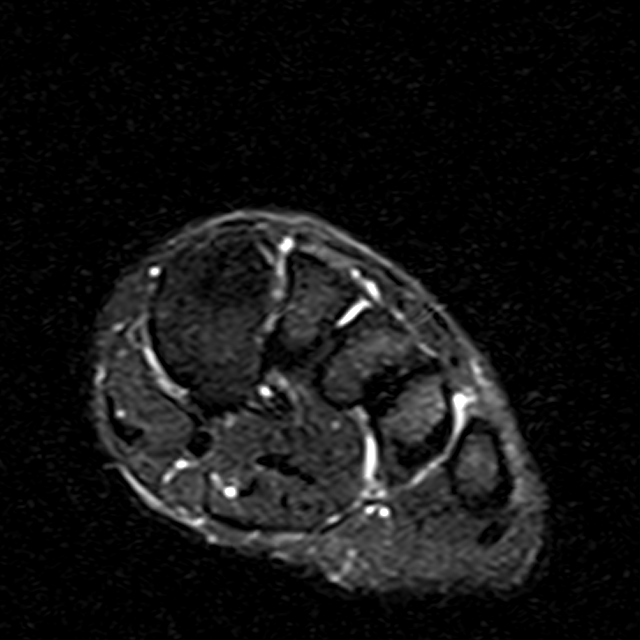

[Series 6: ir sagital · sagittal · 3.0mm · 0.27mm/px · 3 of 25 slices shown]
[im 4/25]
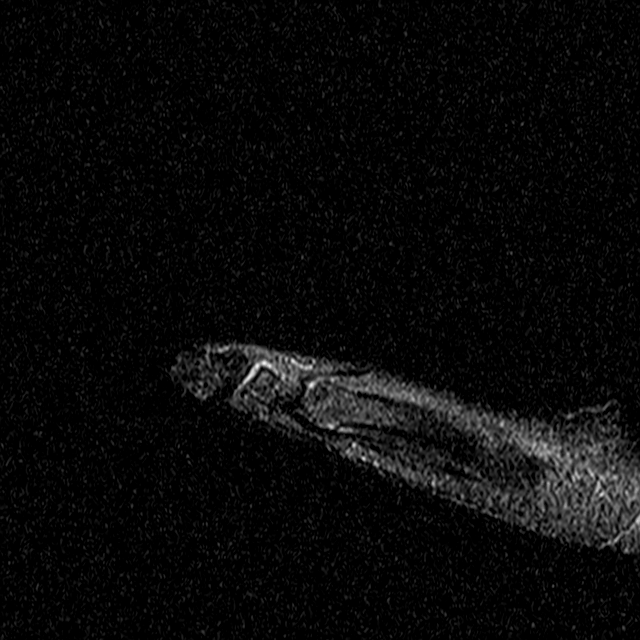
[im 14/25]
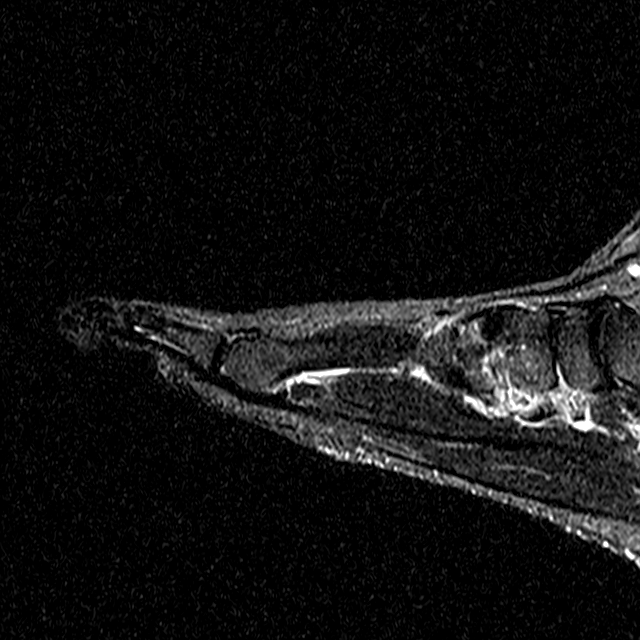
[im 21/25]
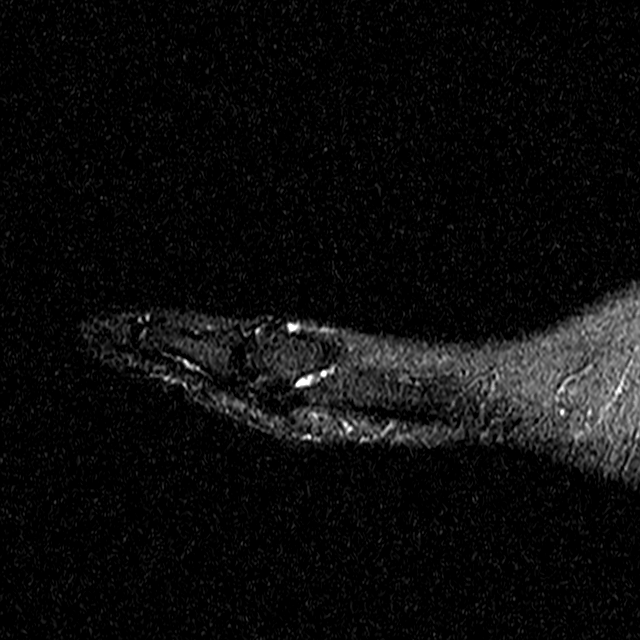

[Series 7: T1 · axial · 3.0mm · 0.25mm/px · z∈[-80,-25]mm · 3 of 18 slices shown (2 of 2)]
[im 1/18]
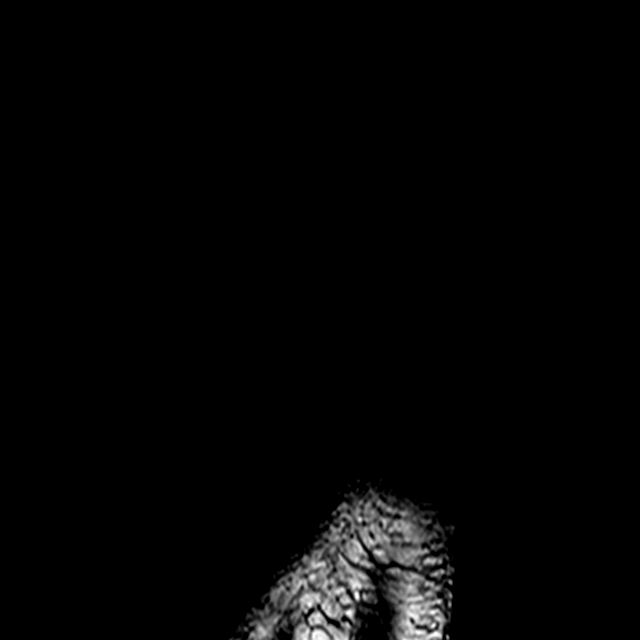
[im 9/18]
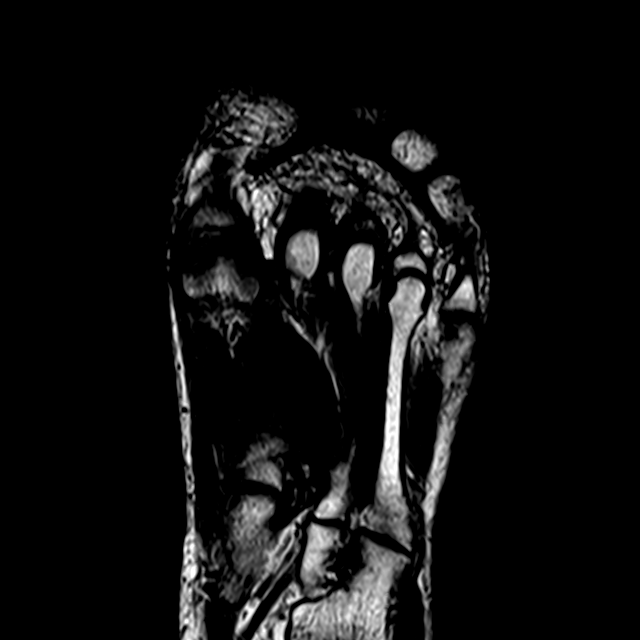
[im 18/18]
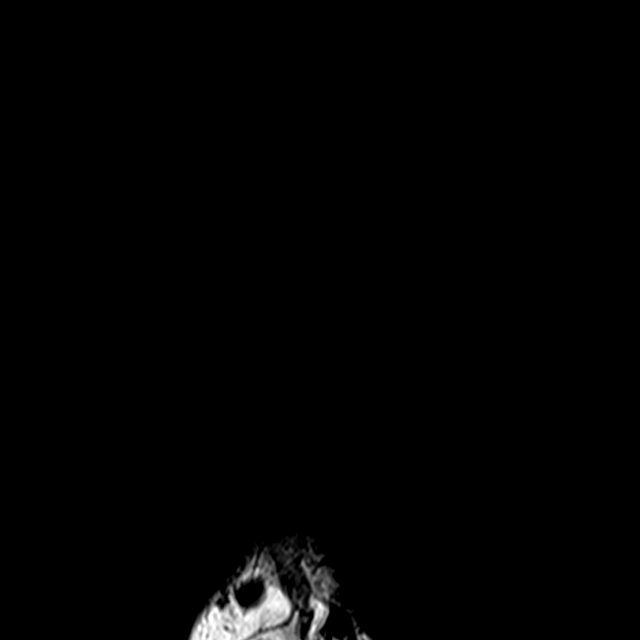

[13 of 40 positions shown; findings below may reference images not displayed]

FINDINGS: Bones/Joint/Cartilage

Mild bone marrow edema at the tip of first distal phalanx without
corresponding T1 abnormality. No definite cortical destruction or
periosteal reaction.

No other marrow signal abnormality. Mild osteoarthritis of the first
MTP joint. Mild osteoarthritis of the talonavicular joint. No
fracture or dislocation. Normal alignment. No joint effusion.

Ligaments

Collateral ligaments are intact.  Lisfranc ligament is intact.

Muscles and Tendons
Flexor, peroneal and extensor compartment tendons are intact.
Muscles are normal. No muscle atrophy. No intramuscular fluid
collection or hematoma.

Soft tissue
No fluid collection or hematoma.  No soft tissue mass.
IMPRESSION: 1. Mild bone marrow edema in the tip of the first distal phalanx
with no cortical destruction or periosteal reaction likely
reflecting reactive changes. No definite evidence of osteomyelitis
of the left foot. Findings seen along the plantar aspect of first
distal phalanx on the recent x-ray dated 08/16/2018 is compatible
with normal anatomic variance.

## 2019-01-24 MED FILL — CIPROFLOXACIN HCL 500 MG TA: 500 | 10 days supply | Qty: 20 | Fill #0

## 2019-01-24 MED FILL — METRONIDAZOLE 500 MG TABS: 500 | 10 days supply | Qty: 20 | Fill #0

## 2019-02-18 ENCOUNTER — Other Ambulatory Visit: Payer: Self-pay | Admitting: Family Medicine

## 2019-02-25 MED FILL — BD NEEDLES 22GX1.5": 22G X 1-1/2 | 30 days supply | Qty: 30 | Fill #0

## 2019-02-25 MED FILL — METHYLPREDNISOLONE 4 MG TAB: 4 | 4 days supply | Qty: 16 | Fill #0

## 2019-02-25 MED FILL — BD 3 ML SYRINGE 18GX1-1/2": 18G X 1-1/2 | 30 days supply | Qty: 30 | Fill #0

## 2019-02-25 MED FILL — BD NEEDLES 22GX1.5: 22G X 1-1/2 | 30 days supply | Qty: 30 | Fill #0

## 2019-02-25 MED FILL — ESTRADIOL 2 MG TABS: 2 | 30 days supply | Qty: 60 | Fill #0

## 2019-02-25 MED FILL — DOTTI 0.1 MG/24HR PTTW: 0.1 | 28 days supply | Qty: 8 | Fill #0

## 2019-02-25 MED FILL — PROGESTERONE OIL 50 MG/ML V: 50 | 30 days supply | Qty: 30 | Fill #0

## 2019-02-25 MED FILL — BD 3 ML SYRINGE 18GX1-1/2: 18G X 1-1/2 | 30 days supply | Qty: 30 | Fill #0

## 2019-02-26 MED FILL — NEUPOGEN 300 MCG/0.5 ML SYR: 300 | 30 days supply | Qty: 1 | Fill #0

## 2019-03-08 MED FILL — NEUPOGEN 300 MCG/0.5 ML SYR: 300 | 30 days supply | Qty: 1 | Fill #0

## 2019-03-11 MED FILL — DIAZEPAM 10 MG TABS: 10 | 1 days supply | Qty: 1 | Fill #0

## 2019-03-23 MED FILL — ESTRADIOL 2 MG TABS: 2 | 30 days supply | Qty: 60 | Fill #1

## 2019-03-23 MED FILL — DOTTI 0.1 MG/24HR PTTW: 0.1 | 28 days supply | Qty: 8 | Fill #1

## 2019-04-10 MED FILL — BD NEEDLES 22GX1.5": 22G X 1-1/2 | 30 days supply | Qty: 30 | Fill #1

## 2019-04-10 MED FILL — BD NEEDLES 22GX1.5: 22G X 1-1/2 | 30 days supply | Qty: 30 | Fill #1

## 2019-04-10 MED FILL — PROGESTERONE OIL 50 MG/ML V: 50 | 30 days supply | Qty: 30 | Fill #1

## 2019-04-13 MED FILL — DOTTI 0.1 MG/24HR PTTW: 0.1 | 28 days supply | Qty: 8 | Fill #0

## 2019-04-14 ENCOUNTER — Encounter (HOSPITAL_COMMUNITY): Payer: Self-pay

## 2019-04-14 ENCOUNTER — Inpatient Hospital Stay (HOSPITAL_COMMUNITY)
Admission: AD | Admit: 2019-04-14 | Discharge: 2019-04-14 | Disposition: A | Discharge status: Home / Self Care | Payer: No Typology Code available for payment source | Attending: Obstetrics & Gynecology | Admitting: Obstetrics & Gynecology

## 2019-04-14 ENCOUNTER — Other Ambulatory Visit: Payer: Self-pay

## 2019-04-14 DIAGNOSIS — Z3A01 Less than 8 weeks gestation of pregnancy: Secondary | ICD-10-CM | POA: Insufficient documentation

## 2019-04-14 DIAGNOSIS — O26891 Other specified pregnancy related conditions, first trimester: Secondary | ICD-10-CM | POA: Diagnosis not present

## 2019-04-14 DIAGNOSIS — R1030 Lower abdominal pain, unspecified: Secondary | ICD-10-CM | POA: Insufficient documentation

## 2019-04-14 DIAGNOSIS — O9A211 Injury, poisoning and certain other consequences of external causes complicating pregnancy, first trimester: Secondary | ICD-10-CM | POA: Diagnosis not present

## 2019-04-14 LAB — POCT PREGNANCY, URINE
Preg Test, Ur: POSITIVE — AB
Preg Test, Ur: POSITIVE — AB
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
## 2019-04-14 NOTE — Discharge Instructions (Signed)
Preventing Injuries During Pregnancy  Injuries can happen during pregnancy. Minor falls and accidents usually do not harm you or your baby. But some injuries can harm you and your baby. Tell your doctor about any injury you suffer. What can I do to avoid injuries? Safety  Remove rugs and loose objects on the floor.  Wear comfortable shoes that have a good grip. Do not wear shoes that have high heels.  Always wear your seat belt in the car. The lap belt should be below your belly. Always drive safely.  Do not ride on a motorcycle. Activity  Do not take part in rough and violent activities or sports.  Avoid: ? Walking on wet or slippery floors. ? Lifting heavy pots of boiling or hot liquids. ? Fixing electrical problems. ? Being near fires. General instructions  Take over-the-counter and prescription medicines only as told by your doctor.  Know your blood type and the blood type of the baby's father.  If you are a victim of domestic violence: ? Call your local emergency services (911 in the [place] ? Contact the [employer] Violence Hotline for help and support. Get help right away if:  You fall on your belly or receive any serious blow to your belly.  You have a stiff neck or neck pain after a fall or an injury.  You get a headache or have problems with vision after an injury.  You do not feel the baby move or the baby is not moving as much as normal.  You have been a victim of domestic violence or any other kind of attack.  You have been in a car accident.  You have bleeding from your vagina.  Fluid is leaking from your vagina.  You start to have cramping or pain in your belly (contractions).  You have very bad pain in your lower back.  You feel weak or pass out (faint).  You start to throw up (vomit) after an injury.  You have been burned. Summary  Some injuries that happen during pregnancy can do harm to the baby.  Tell your doctor about any  injury.  Take steps to avoid injury. This includes removing rugs and loose objects on the floor. Always wear your seat belt in the car.  Do not take part in rough and violent activities or sports.  Get help right away if you have any serious accident or injury. This information is not intended to replace advice given to you by your health care provider. Make sure you discuss any questions you have with your health care provider. Document Released: 11/19/2010 Document Revised: 10/26/2016 Document Reviewed: 10/26/2016 Elsevier Interactive Patient Education  2019 [employer].

## 2019-04-17 MED FILL — BD 3 ML SYRINGE 18GX1-1/2: 18G X 1-1/2 | 30 days supply | Qty: 30 | Fill #1

## 2019-04-17 MED FILL — BD 3 ML SYRINGE 18GX1-1/2": 18G X 1-1/2 | 30 days supply | Qty: 30 | Fill #1

## 2019-05-02 MED FILL — PROGESTERONE MICRONIZED 200: 200 | 14 days supply | Qty: 14 | Fill #0

## 2019-05-15 LAB — OB RESULTS CONSOLE GC/CHLAMYDIA
Chlamydia: NEGATIVE
Gonorrhea: NEGATIVE

## 2019-05-15 LAB — OB RESULTS CONSOLE HIV ANTIBODY (ROUTINE TESTING): HIV: NONREACTIVE

## 2019-05-15 LAB — OB RESULTS CONSOLE ANTIBODY SCREEN: Antibody Screen: NEGATIVE

## 2019-05-15 LAB — OB RESULTS CONSOLE ABO/RH: RH Type: POSITIVE

## 2019-05-15 LAB — OB RESULTS CONSOLE HEPATITIS B SURFACE ANTIGEN: Hepatitis B Surface Ag: NEGATIVE

## 2019-05-15 LAB — OB RESULTS CONSOLE RPR: RPR: NONREACTIVE

## 2019-05-15 LAB — OB RESULTS CONSOLE RUBELLA ANTIBODY, IGM: Rubella: IMMUNE

## 2019-06-05 ENCOUNTER — Other Ambulatory Visit (HOSPITAL_COMMUNITY): Payer: Self-pay | Admitting: Obstetrics and Gynecology

## 2019-06-05 DIAGNOSIS — Z363 Encounter for antenatal screening for malformations: Secondary | ICD-10-CM

## 2019-06-05 DIAGNOSIS — Z3A19 19 weeks gestation of pregnancy: Secondary | ICD-10-CM

## 2019-06-05 DIAGNOSIS — O285 Abnormal chromosomal and genetic finding on antenatal screening of mother: Secondary | ICD-10-CM

## 2019-07-05 ENCOUNTER — Ambulatory Visit (HOSPITAL_COMMUNITY): Payer: Self-pay | Admitting: Genetic Counselor

## 2019-07-05 ENCOUNTER — Ambulatory Visit (HOSPITAL_COMMUNITY)
Admission: RE | Admit: 2019-07-05 | Discharge: 2019-07-05 | Disposition: A | Discharge status: Home / Self Care | Payer: No Typology Code available for payment source | Source: Ambulatory Visit | Attending: Obstetrics and Gynecology | Admitting: Obstetrics and Gynecology

## 2019-07-05 ENCOUNTER — Ambulatory Visit (HOSPITAL_COMMUNITY): Payer: No Typology Code available for payment source | Admitting: *Deleted

## 2019-07-05 ENCOUNTER — Ambulatory Visit (HOSPITAL_BASED_OUTPATIENT_CLINIC_OR_DEPARTMENT_OTHER): Payer: No Typology Code available for payment source | Admitting: Genetic Counselor

## 2019-07-05 ENCOUNTER — Other Ambulatory Visit (HOSPITAL_COMMUNITY): Payer: Self-pay | Admitting: *Deleted

## 2019-07-05 ENCOUNTER — Other Ambulatory Visit: Payer: Self-pay

## 2019-07-05 ENCOUNTER — Encounter (HOSPITAL_COMMUNITY): Payer: Self-pay

## 2019-07-05 VITALS — BP 125/71 | HR 85 | Temp 98.4°F | Wt 246.0 lb

## 2019-07-05 DIAGNOSIS — D259 Leiomyoma of uterus, unspecified: Secondary | ICD-10-CM

## 2019-07-05 DIAGNOSIS — Z315 Encounter for genetic counseling: Secondary | ICD-10-CM | POA: Diagnosis not present

## 2019-07-05 DIAGNOSIS — Z3A19 19 weeks gestation of pregnancy: Secondary | ICD-10-CM | POA: Diagnosis not present

## 2019-07-05 DIAGNOSIS — O285 Abnormal chromosomal and genetic finding on antenatal screening of mother: Secondary | ICD-10-CM

## 2019-07-05 DIAGNOSIS — Z3A18 18 weeks gestation of pregnancy: Secondary | ICD-10-CM

## 2019-07-05 DIAGNOSIS — O09812 Supervision of pregnancy resulting from assisted reproductive technology, second trimester: Secondary | ICD-10-CM

## 2019-07-05 DIAGNOSIS — O09523 Supervision of elderly multigravida, third trimester: Secondary | ICD-10-CM

## 2019-07-05 DIAGNOSIS — O289 Unspecified abnormal findings on antenatal screening of mother: Secondary | ICD-10-CM | POA: Diagnosis not present

## 2019-07-05 DIAGNOSIS — O3412 Maternal care for benign tumor of corpus uteri, second trimester: Secondary | ICD-10-CM

## 2019-07-05 DIAGNOSIS — O09529 Supervision of elderly multigravida, unspecified trimester: Secondary | ICD-10-CM

## 2019-07-05 DIAGNOSIS — Z363 Encounter for antenatal screening for malformations: Secondary | ICD-10-CM | POA: Insufficient documentation

## 2019-07-05 DIAGNOSIS — O3429 Maternal care due to uterine scar from other previous surgery: Secondary | ICD-10-CM

## 2019-07-05 DIAGNOSIS — O09522 Supervision of elderly multigravida, second trimester: Secondary | ICD-10-CM | POA: Diagnosis not present

## 2019-07-05 NOTE — Progress Notes (Signed)
07/05/2019  AMANPREET BONURA [birth year] MRN: [id number] DOV: 07/05/2019  Ms. Heinly presented to the [facility] for a genetics consultation regarding abnormal first trimester screen results. Ms. Brigance was accompanied to her appointment by her husband, Rigley Prem.   Indication for genetic counseling - Increased risk for trisomy 40 (Down syndrome) on first trimester screening  Prenatal history  Ms. Bacik is a G50P1011, 43 y.o. female. Her current pregnancy has completed [redacted]w[redacted]d (Estimated Date of Delivery: 12/06/19). This pregnancy was achieved via in vitro fertilization (IVF) using her own thawed eggs. Ms. Antonellis reported that the embryo underwent prenatal genetic screening (PGS) for chromosomal aneuploidies prior to implantation. Ms. Ridl was aware that IVF can slightly increase the risk for pregnancy-related complications. Additionally, PGS does not screen for all genetic conditions, and a group of genetic conditions called imprinting disorders are slightly more likely in individuals conceived via IVF (2-5 in 15,000) compared to the general population (1 in 15,000).  Ms. Talwar denied exposure to environmental toxins or chemical agents. She denied the use of alcohol, tobacco or street drugs. She reported taking prenatal vitamins and baby aspirin. She denied significant viral illnesses, fevers, and bleeding during the course of her pregnancy. Her medical and surgical histories were noncontributory.  Family History  A three generation pedigree was drafted and reviewed. The family history is remarkable for the following:  - Ms. Papka reported a personal history of a heart murmur. The exact etiology of this hear murmur is unknown. It is unclear if Ms. Smolenski has an isolated congenital heart defect (CHD) causing her murmur. There are multifactorial causes for isolated CHDs, including environmental and genetic factors. As such, the recurrence risk for first degree relatives is  elevated over the general population incidence of 1/200 (0.5%). We discussed that if Ms. Spragg's murmur is due to an isolated CHD, the risk for a child of an affected mother to have a CHD is approximately 5-6%.  - Ms. Suero also reported a personal history of dyslexia. Mr. Sjoberg reported that he had problems with learning in school, especially with reading. We discussed that manytimes, learning difficulties are multifactorial in nature, occurring due to a combination of genetic and environmental factorsthat are difficult to identify.Learning disabilitiescan appear to run in families; thus, there is a chance that the couple's children could alsoexperience learning difficulties of some kind. The couple understands that they should make the pediatrician aware of any concerns they have abouther children'sdevelopment.  - Ms. Gopal's sister has epilepsy that was diagnosed at the age of 48. She is still reportedly having seizures. This sister's daughter also had an episode that may have been indicative of a seizure. The etiology of these seizures is unknown per Ms. Westby. Seizures can occur secondary to a variety of environmental or hereditary disorders. When epilepsy does not have an identified genetic cause, the chance that the child or sibling of someone with epilepsy will also have or develop epilepsy is approximately 2-5%. However, when epilepsy has a genetic cause, the risks to relatives can be much greater. Without knowing the etiology of the seizures in the family, precise risk assessment is limited.  - Ms. Swander's sister discussed above was also born with her "kidney reversed", which was surgically corrected at the age of 44. Ms. Sherlin father currently has stage IV kidney disease. Kidney disease and renal anomalies can occur due to a variety of environmental, lifestyle, and genetic factors. More than 60 genetic diseases are currently known to  directly or indirectly affect the kidneys. While some  inherited conditions are associated with only mild symptoms, others can cause severe health problems. Without knowing the exact etiology of Ms. Hoen's family members' renal conditions, precise risk assessment is limited.  - Ms. Tiedeman's mother has type I diabetes. She had a brother with multiple sclerosis who passed away due to liver cirrhosis related to alcohol use. We discussed that these conditions both belong to the family of conditions known as autoimmune conditions. Autoimmune conditions occur when an individual's body launches an abnormal immune response and begins to destroy its own cells. There are several different autoimmune conditions. While we do know that autoimmune conditions tend to "cluster" within a family, they do not follow a clear pattern of inheritance. When there is a person in the family with an autoimmune condition, there is an increased chance for others in the family to develop an autoimmune condition, but it may be a different condition. Specific risk factor information is not available. Genetic testing is not available at this time to predict who may develop an autoimmune condition.  - Ms. Carp's maternal aunt has a daughter whose son has cystic fibrosis. Cystic fibrosis (CF) is an inherited disease characterized by the buildup of thick, sticky mucus that can damage many of the body's organs. The most common signs and symptoms of CF include progressive damage to the respiratory system and chronic digestive system problems. Features of CF and their severity varies among affected individuals. CF is inherited in an autosomal recessive pattern, where both parents must be carriers of the condition to have a 1 in 4 (25%) risk of having an affected child. Ms. Dysert had negative CF carrier screening. This significantly decreases her risk of having a child with CF. However, there is a small residual risk that Ms. Stables could be a carrier of CF if her familial mutation was not included in the  panel of mutations that she was screened for.  - Mr. Madar has a strong family history of type II diabetes. We discussed that diabetes is multifactorial in nature, occurring due to a combination of genetic, lifestyle, and environmental factors. Diabetes can appear to run in families, as we see in both his maternal and paternal family history; thus, there is a chance that the Mr. Lapiana and the couple's children could also develop diabetes.  The remaining family histories were reviewed and found to be noncontributory for birth defects, intellectual disability, recurrent pregnancy loss, and known genetic conditions.    The patient's ethnicity is [place], [place], and [place]. The father of the pregnancy's ethnicity is African American. Ashkenazi Jewish ancestry and consanguinity were denied. Pedigree will be scanned under Media.  Discussion  Ms. Sabatini was referred for genetic counseling as results from first trimester screening came back positive for an increased risk of trisomy 39, aka Down syndrome, in the current pregnancy.   Down syndrome is one of the most common extra chromosome conditions, as approximately 1 in 800 babies are born with this condition. Down syndrome is characterized by a characteristic facial appearance, mild to moderate mental retardation, and an increased chance for a heart defect. Approximately half of babies with Down syndrome are born with a heart defect that may require surgery after birth. Children with Down syndrome also have an increased chance for thyroid problems, which can range from an underactive to an overactive thyroid. All newborns in [place] are screened for thyroid function at birth. Also, low muscle tone, vision problems,  and respiratory and ear infections are more common among babies with Down syndrome. We discussed that there are additional features associated with Down syndrome. However, prenatally we cannot accurately predict all features  that would be present in an individual with Down syndrome.   Although Ms. Ferner's first trimester screening results were positive, the risk for Down syndrome in the current pregnancy actually decreased from her 1 in 58 age-related risk to 1 in 100 (1%) based on results from this screen. Additionally, the risk for trisomy 18 in the current pregnancy decreased from her 1 in 36 age-related risk to 1 in 24. First trimester screen results were flagged as positive as the adjusted risk for Down syndrome in the current pregnancy is still higher than the cutoff threshold for the screen (1 in 250).   Ms. Boatman reported that she had [facility]ama NIPS after first trimester screening that was negative, or low-risk for fetal aneuploidies. These results showed a less than 1 in 10,000 risk for trisomies 21, 18 and 13, and monosomy X (Turner syndrome).  In addition, the risk for triploidy and sex chromosome trisomies (47,XXX and 47,XXY) was also low. Ms. Nauert elected to have cffDNA analysis for 22q11 deletion syndrome, which was also low risk (1 in 9000). We reviewed that while this testing identifies > 99% of pregnancies with trisomy 50, trisomy 15, sex chromosome trisomies (47,XXX and 47,XXY), and triploidy, it is NOT diagnostic. A positive test result requires confirmation by CVS or amniocentesis, and a negative test result does not rule out a fetal chromosome abnormality. However, this screening result significantly decreases the risk for Down syndrome in the current pregnancy. She also understands that this testing does not identify all genetic conditions.  We also reviewed that Ms. Sovey had carrier screening for several conditions, including cystic fibrosis, spinal muscular atrophy, fragile X syndrome, and hemoglobinopathies. Carrier screening results for these conditions all came back negative. This puts her pregnancies at significantly decreased risk of being affected by one of these conditions.  A complete ultrasound  was performed today prior to our visit. The ultrasound report will be sent under separate cover. There were no visualized fetal anomalies or markers suggestive of aneuploidy.  Ms. Sarsfield was also counseled regarding diagnostic testing via amniocentesis. We discussed the technical aspects of the procedure and quoted up to a 1 in 500 (0.2%) risk for spontaneous pregnancy loss or other adverse pregnancy outcomes as a result of amniocentesis. Cultured cells from an amniocentesis sample allow for the visualization of a fetal karyotype, which can detect >99% of chromosomal aberrations. Chromosomal microarray can also be performed to identify smaller deletions or duplications of fetal chromosomal material. After careful consideration, Ms. Lane declined amniocentesis at this time. She understands that amniocentesis is available at any point after 16 weeks of pregnancy and that she may opt to undergo the procedure at a later date should she change her mind.  Lastly, the patient was made aware that screening for open neural tube defects (ONTDs) via MS-AFP in the second trimester in addition to level II ultrasound examination is recommended. We reviewed that Ms. Schaller's level II ultrasound did not detect any ONTDs, and that level II ultrasound is able to detect them with 90-95% sensitivity. However, normal results from any of the above options do not guarantee a normal baby, as 3-5% of newborns have some type of birth defect, many of which are not prenatally diagnosable.  Additional screening and diagnostic testing were declined today. Ms. Arvie was thrilled about  her normal ultrasound and negative screening results. She understands that screening tests, including ultrasound, cannot rule out all birth defects or genetic syndromes. The patient was advised of this limitation and states she still does not want additional testing or screening at this time.   I counseled Ms. Czaja regarding the above risks and available  options. The approximate face-to-face time with the genetic counselor was 35 minutes.  In summary:  Discussed aneuploidy screening results and options for follow-up testing  Risks for Down syndrome and trisomy 18 in the current pregnancy decreased from age-related risks on first trimester screen despite positive result  Low-risk NIPS reduced risk for Down syndrome, trisomy 64, trisomy 40, sex chromosome aneuploidies, and 22q11 deletion syndrome in the current pregnancy  Declined amniocentesis  Reviewed negative carrier screening for cystic fibrosis, spinal muscular atrophy, hemoglobinopathies, and fragile X syndrome   Reviewed results of ultrasound  No fetal anomalies or markers seen  Reduction in risk for fetal aneuploidy  Offered additional testing and screening  Recommend MS-AFP screening   Reviewed family history concerns   Buelah Manis, MS Genetic Counselor

## 2019-07-11 ENCOUNTER — Other Ambulatory Visit (HOSPITAL_COMMUNITY): Payer: Self-pay | Admitting: Obstetrics and Gynecology

## 2019-07-11 ENCOUNTER — Encounter (HOSPITAL_COMMUNITY): Payer: Self-pay | Admitting: Obstetrics and Gynecology

## 2019-07-12 ENCOUNTER — Other Ambulatory Visit (HOSPITAL_COMMUNITY): Payer: No Typology Code available for payment source

## 2019-07-12 ENCOUNTER — Encounter (HOSPITAL_COMMUNITY): Payer: No Typology Code available for payment source

## 2019-07-12 ENCOUNTER — Ambulatory Visit (HOSPITAL_COMMUNITY): Payer: No Typology Code available for payment source

## 2019-08-02 ENCOUNTER — Ambulatory Visit (HOSPITAL_COMMUNITY)
Admission: RE | Admit: 2019-08-02 | Discharge: 2019-08-02 | Disposition: A | Discharge status: Home / Self Care | Payer: No Typology Code available for payment source | Source: Ambulatory Visit | Attending: Obstetrics and Gynecology | Admitting: Obstetrics and Gynecology

## 2019-08-02 ENCOUNTER — Encounter (HOSPITAL_COMMUNITY): Payer: Self-pay

## 2019-08-02 ENCOUNTER — Other Ambulatory Visit: Payer: Self-pay

## 2019-08-02 ENCOUNTER — Ambulatory Visit (HOSPITAL_COMMUNITY): Payer: No Typology Code available for payment source | Admitting: *Deleted

## 2019-08-02 ENCOUNTER — Other Ambulatory Visit (HOSPITAL_COMMUNITY): Payer: Self-pay | Admitting: *Deleted

## 2019-08-02 VITALS — BP 123/71 | HR 90 | Temp 98.1°F

## 2019-08-02 DIAGNOSIS — O0990 Supervision of high risk pregnancy, unspecified, unspecified trimester: Secondary | ICD-10-CM | POA: Insufficient documentation

## 2019-08-02 DIAGNOSIS — O289 Unspecified abnormal findings on antenatal screening of mother: Secondary | ICD-10-CM | POA: Diagnosis not present

## 2019-08-02 DIAGNOSIS — O09522 Supervision of elderly multigravida, second trimester: Secondary | ICD-10-CM | POA: Diagnosis not present

## 2019-08-02 DIAGNOSIS — O09529 Supervision of elderly multigravida, unspecified trimester: Secondary | ICD-10-CM | POA: Diagnosis not present

## 2019-08-02 DIAGNOSIS — O3412 Maternal care for benign tumor of corpus uteri, second trimester: Secondary | ICD-10-CM

## 2019-08-02 DIAGNOSIS — Z3A22 22 weeks gestation of pregnancy: Secondary | ICD-10-CM

## 2019-08-02 DIAGNOSIS — Z362 Encounter for other antenatal screening follow-up: Secondary | ICD-10-CM | POA: Diagnosis not present

## 2019-08-02 DIAGNOSIS — O3429 Maternal care due to uterine scar from other previous surgery: Secondary | ICD-10-CM

## 2019-08-02 DIAGNOSIS — O09812 Supervision of pregnancy resulting from assisted reproductive technology, second trimester: Secondary | ICD-10-CM

## 2019-08-02 DIAGNOSIS — D259 Leiomyoma of uterus, unspecified: Secondary | ICD-10-CM

## 2019-08-29 ENCOUNTER — Ambulatory Visit (HOSPITAL_COMMUNITY): Payer: No Typology Code available for payment source | Admitting: *Deleted

## 2019-08-29 ENCOUNTER — Other Ambulatory Visit: Payer: Self-pay

## 2019-08-29 ENCOUNTER — Ambulatory Visit (HOSPITAL_COMMUNITY)
Admission: RE | Admit: 2019-08-29 | Discharge: 2019-08-29 | Disposition: A | Discharge status: Home / Self Care | Payer: No Typology Code available for payment source | Source: Ambulatory Visit | Attending: Obstetrics | Admitting: Obstetrics

## 2019-08-29 ENCOUNTER — Encounter (HOSPITAL_COMMUNITY): Payer: Self-pay | Admitting: *Deleted

## 2019-08-29 VITALS — BP 130/58 | HR 99 | Temp 98.9°F

## 2019-08-29 DIAGNOSIS — D259 Leiomyoma of uterus, unspecified: Secondary | ICD-10-CM | POA: Diagnosis not present

## 2019-08-29 DIAGNOSIS — O4402 Placenta previa specified as without hemorrhage, second trimester: Secondary | ICD-10-CM

## 2019-08-29 DIAGNOSIS — O283 Abnormal ultrasonic finding on antenatal screening of mother: Secondary | ICD-10-CM

## 2019-08-29 DIAGNOSIS — O09522 Supervision of elderly multigravida, second trimester: Secondary | ICD-10-CM

## 2019-08-29 DIAGNOSIS — O43192 Other malformation of placenta, second trimester: Secondary | ICD-10-CM

## 2019-08-29 DIAGNOSIS — O09529 Supervision of elderly multigravida, unspecified trimester: Secondary | ICD-10-CM

## 2019-08-29 DIAGNOSIS — O09812 Supervision of pregnancy resulting from assisted reproductive technology, second trimester: Secondary | ICD-10-CM

## 2019-08-29 DIAGNOSIS — Z3A25 25 weeks gestation of pregnancy: Secondary | ICD-10-CM

## 2019-08-29 DIAGNOSIS — O3412 Maternal care for benign tumor of corpus uteri, second trimester: Secondary | ICD-10-CM | POA: Diagnosis not present

## 2019-08-30 ENCOUNTER — Other Ambulatory Visit (HOSPITAL_COMMUNITY): Payer: Self-pay | Admitting: *Deleted

## 2019-08-30 DIAGNOSIS — O4400 Placenta previa specified as without hemorrhage, unspecified trimester: Secondary | ICD-10-CM

## 2019-09-20 ENCOUNTER — Encounter: Payer: Self-pay | Admitting: Dietician

## 2019-09-20 ENCOUNTER — Other Ambulatory Visit: Payer: Self-pay

## 2019-09-20 ENCOUNTER — Encounter: Payer: No Typology Code available for payment source | Attending: Obstetrics and Gynecology | Admitting: Dietician

## 2019-09-20 DIAGNOSIS — O24410 Gestational diabetes mellitus in pregnancy, diet controlled: Secondary | ICD-10-CM

## 2019-09-20 MED FILL — FREESTYLE LITE TEST STRIP: 25 days supply | Qty: 100 | Fill #0

## 2019-09-20 MED FILL — PANTOPRAZOLE SOD DR 20 MG T: 20 | 30 days supply | Qty: 60 | Fill #0

## 2019-09-20 MED FILL — FREESTYLE LANCETS: 25 days supply | Qty: 100 | Fill #0

## 2019-09-20 MED FILL — FREESTYLE LITE METER: 1 days supply | Qty: 1 | Fill #0

## 2019-09-20 NOTE — Patient Instructions (Signed)
Plan:  Aim for 3 Carb Choices per meal (45 grams) +/- 1 either way  Aim for 0-1 Carbs per snack if hungry  Include protein in moderation with your meals and snacks Consider reading food labels for Total Carbohydrate of foods Consider  increasing your activity level by walking for 30 minutes daily as tolerated Check your blood sugar 4 times per day.  Before breakfast, 1 hour after each meal

## 2019-09-20 NOTE — Progress Notes (Signed)
Patient lives with her husband and 58 son.  They both shop and she cooks a little but gets take out or eats at the hospital cafeteria most often.  She works as a Air traffic controller at [employer].  She is [redacted] weeks gestation.  Weight is 250 lbs today and was 240's prior to pregnancy.  Patient was seen on 09/20/2019 for Gestational Diabetes one on one visit [facility]. The following learning objectives were met by the patient during this course:   States the definition of Gestational Diabetes  States why dietary management is important in controlling blood glucose  Describes the effects each nutrient has on blood glucose levels  Demonstrates ability to create a balanced meal plan  Demonstrates carbohydrate counting   States when to check blood glucose levels  Demonstrates proper blood glucose monitoring techniques  States the effect of stress and exercise on blood glucose levels  States the importance of limiting caffeine and abstaining from alcohol and smoking  Blood glucose monitor given: FreeStyle Libre Lite sample without strips Blood glucose reading: 105 She called and left a message for her nurse to request a meter, strips, and lancing needles.  Patient instructed to monitor glucose levels: FBS: 60 - <90 1 hour: <140   *Patient received handouts:  Nutrition Diabetes and Pregnancy  Carbohydrate Counting List  [employer] employee George Hugh to [employer].  Plan:  Aim for 3 Carb Choices per meal (45 grams) +/- 1 either way  Aim for 0-1 Carbs per snack if hungry  Include protein in moderation with your meals and snacks Consider reading food labels for Total Carbohydrate of foods Consider  increasing your activity level by walking for 30 minutes daily as tolerated Check your blood sugar 4 times per day.  Before breakfast, 1 hour after each meal   Patient will be seen for follow-up as needed.

## 2019-09-25 ENCOUNTER — Encounter (HOSPITAL_COMMUNITY): Payer: Self-pay

## 2019-09-25 ENCOUNTER — Ambulatory Visit (HOSPITAL_COMMUNITY): Payer: No Typology Code available for payment source

## 2019-09-25 ENCOUNTER — Ambulatory Visit (HOSPITAL_COMMUNITY): Payer: No Typology Code available for payment source | Admitting: *Deleted

## 2019-09-25 ENCOUNTER — Other Ambulatory Visit: Payer: Self-pay

## 2019-09-25 ENCOUNTER — Ambulatory Visit (HOSPITAL_COMMUNITY)
Admission: RE | Admit: 2019-09-25 | Discharge: 2019-09-25 | Disposition: A | Discharge status: Home / Self Care | Payer: No Typology Code available for payment source | Source: Ambulatory Visit | Attending: Obstetrics and Gynecology | Admitting: Obstetrics and Gynecology

## 2019-09-25 VITALS — BP 123/67 | HR 83 | Temp 97.4°F

## 2019-09-25 DIAGNOSIS — O3429 Maternal care due to uterine scar from other previous surgery: Secondary | ICD-10-CM

## 2019-09-25 DIAGNOSIS — O09523 Supervision of elderly multigravida, third trimester: Secondary | ICD-10-CM

## 2019-09-25 DIAGNOSIS — Z362 Encounter for other antenatal screening follow-up: Secondary | ICD-10-CM

## 2019-09-25 DIAGNOSIS — O09529 Supervision of elderly multigravida, unspecified trimester: Secondary | ICD-10-CM | POA: Diagnosis present

## 2019-09-25 DIAGNOSIS — D259 Leiomyoma of uterus, unspecified: Secondary | ICD-10-CM

## 2019-09-25 DIAGNOSIS — O289 Unspecified abnormal findings on antenatal screening of mother: Secondary | ICD-10-CM | POA: Diagnosis not present

## 2019-09-25 DIAGNOSIS — O43193 Other malformation of placenta, third trimester: Secondary | ICD-10-CM

## 2019-09-25 DIAGNOSIS — O4403 Placenta previa specified as without hemorrhage, third trimester: Secondary | ICD-10-CM | POA: Diagnosis not present

## 2019-09-25 DIAGNOSIS — O3413 Maternal care for benign tumor of corpus uteri, third trimester: Secondary | ICD-10-CM

## 2019-09-25 DIAGNOSIS — O4400 Placenta previa specified as without hemorrhage, unspecified trimester: Secondary | ICD-10-CM | POA: Diagnosis not present

## 2019-09-25 DIAGNOSIS — Z3A29 29 weeks gestation of pregnancy: Secondary | ICD-10-CM

## 2019-09-25 DIAGNOSIS — O09813 Supervision of pregnancy resulting from assisted reproductive technology, third trimester: Secondary | ICD-10-CM

## 2019-09-30 ENCOUNTER — Other Ambulatory Visit (HOSPITAL_COMMUNITY): Payer: Self-pay | Admitting: *Deleted

## 2019-09-30 DIAGNOSIS — O4403 Placenta previa specified as without hemorrhage, third trimester: Secondary | ICD-10-CM

## 2019-10-14 MED FILL — FREESTYLE LITE TEST STRIP: 25 days supply | Qty: 100 | Fill #1

## 2019-10-14 MED FILL — FREESTYLE LANCETS: 25 days supply | Qty: 100 | Fill #1

## 2019-10-18 DIAGNOSIS — Z3A33 33 weeks gestation of pregnancy: Secondary | ICD-10-CM | POA: Diagnosis not present

## 2019-10-18 DIAGNOSIS — O219 Vomiting of pregnancy, unspecified: Secondary | ICD-10-CM | POA: Diagnosis not present

## 2019-10-18 DIAGNOSIS — R531 Weakness: Secondary | ICD-10-CM | POA: Insufficient documentation

## 2019-10-18 DIAGNOSIS — J45909 Unspecified asthma, uncomplicated: Secondary | ICD-10-CM | POA: Diagnosis not present

## 2019-10-18 DIAGNOSIS — Z79899 Other long term (current) drug therapy: Secondary | ICD-10-CM | POA: Insufficient documentation

## 2019-10-18 DIAGNOSIS — R42 Dizziness and giddiness: Secondary | ICD-10-CM | POA: Insufficient documentation

## 2019-10-18 DIAGNOSIS — Z91040 Latex allergy status: Secondary | ICD-10-CM | POA: Diagnosis not present

## 2019-10-18 DIAGNOSIS — O2693 Pregnancy related conditions, unspecified, third trimester: Secondary | ICD-10-CM | POA: Diagnosis not present

## 2019-10-19 ENCOUNTER — Emergency Department (HOSPITAL_COMMUNITY)
Admission: EM | Admit: 2019-10-19 | Discharge: 2019-10-19 | Disposition: A | Discharge status: Home / Self Care | Payer: No Typology Code available for payment source | Attending: Emergency Medicine | Admitting: Emergency Medicine

## 2019-10-19 ENCOUNTER — Other Ambulatory Visit: Payer: Self-pay

## 2019-10-19 ENCOUNTER — Encounter (HOSPITAL_COMMUNITY): Payer: Self-pay | Admitting: Emergency Medicine

## 2019-10-19 DIAGNOSIS — R42 Dizziness and giddiness: Secondary | ICD-10-CM

## 2019-10-19 LAB — URINALYSIS, ROUTINE W REFLEX MICROSCOPIC
Bilirubin Urine: NEGATIVE
Glucose, UA: 150 mg/dL — AB
Hgb urine dipstick: NEGATIVE
Ketones, ur: 80 mg/dL — AB
Leukocytes,Ua: NEGATIVE
Nitrite: NEGATIVE
Protein, ur: 100 mg/dL — AB
Specific Gravity, Urine: 1.024 (ref 1.005–1.030)
pH: 5 (ref 5.0–8.0)

## 2019-10-19 LAB — CBC
HCT: 38.4 % (ref 36.0–46.0)
Hemoglobin: 11.9 g/dL — ABNORMAL LOW (ref 12.0–15.0)
MCH: 27.5 pg (ref 26.0–34.0)
MCHC: 31 g/dL (ref 30.0–36.0)
MCV: 88.9 fL (ref 80.0–100.0)
Platelets: 319 10*3/uL (ref 150–400)
RBC: 4.32 MIL/uL (ref 3.87–5.11)
RDW: 13.2 % (ref 11.5–15.5)
WBC: 9.5 10*3/uL (ref 4.0–10.5)
nRBC: 0 % (ref 0.0–0.2)

## 2019-10-19 LAB — COMPREHENSIVE METABOLIC PANEL
ALT: 24 U/L (ref 0–44)
AST: 25 U/L (ref 15–41)
Albumin: 3.3 g/dL — ABNORMAL LOW (ref 3.5–5.0)
Alkaline Phosphatase: 134 U/L — ABNORMAL HIGH (ref 38–126)
Anion gap: 10 (ref 5–15)
BUN: 8 mg/dL (ref 6–20)
CO2: 20 mmol/L — ABNORMAL LOW (ref 22–32)
Calcium: 9.3 mg/dL (ref 8.9–10.3)
Chloride: 105 mmol/L (ref 98–111)
Creatinine, Ser: 0.56 mg/dL (ref 0.44–1.00)
GFR calc Af Amer: >60 mL/min (ref >60)
GFR calc non Af Amer: >60 mL/min (ref >60)
Glucose, Bld: 102 mg/dL — ABNORMAL HIGH (ref 70–99)
Potassium: 3.6 mmol/L (ref 3.5–5.1)
Sodium: 135 mmol/L (ref 135–145)
Total Bilirubin: 0.5 mg/dL (ref 0.3–1.2)
Total Protein: 7.6 g/dL (ref 6.5–8.1)

## 2019-10-19 MED ORDER — SODIUM CHLORIDE 0.9 % IV BOLUS
1000.0000 mL | Freq: Once | INTRAVENOUS | Status: AC
  Administered 2019-10-19: 1000 mL via INTRAVENOUS

## 2019-10-19 MED ORDER — ONDANSETRON HCL 4 MG/2ML IJ SOLN
4.0000 mg | Freq: Once | INTRAMUSCULAR | Status: AC
  Administered 2019-10-19: 4 mg via INTRAVENOUS
  Filled 2019-10-19: qty 2

## 2019-10-19 NOTE — ED Provider Notes (Signed)
[facility] EMERGENCY DEPARTMENT Provider Note   CSN: [id number] Arrival date & time: 10/18/19  2359     History Chief Complaint  Patient presents with  . Dizziness    Stacey Carlson is a 43 y.o. female.  Patient presents to the emergency department with generalized weakness and dizziness.  Reports that she did not feel well before coming to work tonight.  She had some mild nausea and vomited one time.  Since coming to work, she has become acutely very weak, unsteady on her feet, lightheaded and has vomited 1 more time.  Patient is pregnant at [redacted] weeks and 1 day.  She called her OB/GYN doctor and was told to check into the ED to be evaluated.  Patient is not having any abdominal pain.  She has not had any vaginal bleeding, loss of fluid.  She denies contractions.        Past Medical History:  Diagnosis Date  . Asthma    rarely uses inhaler - exercise induced   . Fibroids   . GDM (gestational diabetes mellitus) 2020  . Murmur   . Seasonal allergies   . SVD (spontaneous vaginal delivery)    x 1    Patient Active Problem List   Diagnosis Date Noted  . Ulcer of toe of left foot ([place]) 08/15/2018  . 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 Irritation of left eye 05/24/2018  . Encounter for fertility planning 07/13/2015  . ASCUS with positive high risk HPV 03/19/2015  . Well woman exam 02/21/2012  . Palpitations 02/03/2012  . PVC (premature ventricular contraction) 02/03/2012  . History of AGUS pap 2012 10/15/2009  . Obesity, unspecified 08/21/2009  . Allergic rhinitis 08/21/2009    Past Surgical History:  Procedure Laterality Date  . CHROMOPERTUBATION  03/16/2016   Procedure: CHROMOPERTUBATION;  Surgeon: Governor Specking, MD;  Location: [facility];  Service: Gynecology;;  . KNEE SURGERY Right    x 2  . LAPAROSCOPY  03/16/2016   Procedure: LAPAROSCOPY OPERATIVE;  Surgeon: Governor Specking, MD;  Location: [facility];  Service: Gynecology;;  . MYOMECTOMY  03/16/2016   Procedure:  MYOMECTOMY;  Surgeon: Governor Specking, MD;  Location: [facility];  Service: Gynecology;;  . WISDOM TOOTH EXTRACTION       OB History    Gravida  3   Para  1   Term  1   Preterm      AB  1   Living  1     SAB  1   TAB      Ectopic      Multiple      Live Births  1           No family history on file.  Social History   Tobacco Use  . Smoking status: Never Smoker  . Smokeless tobacco: Never Used  Substance Use Topics  . Alcohol use: No  . Drug use: No    Home Medications Prior to Admission medications   Medication Sig Start Date End Date Taking? Authorizing Provider  ACAI BERRY PO Take 2 capsules by mouth 2 (two) times daily as needed (For constipation.).    [provider]  albuterol (VENTOLIN HFA) 108 (90 Base) MCG/ACT inhaler Inhale 1-2 puffs into the lungs every 4 (four) hours as needed. And before exercise 12/27/17   Steve Rattler, DO  ASPIRIN 81 PO Take by mouth.    [provider]  fluticasone (FLONASE) 50 MCG/ACT nasal spray Place 2 sprays into  both nostrils daily. Per nostril Patient not taking: Reported on 09/25/2019 12/27/17   Steve Rattler, DO  folic acid (FOLVITE) 1 MG tablet Take 1 mg by mouth daily.    [provider]  Olopatadine HCl (PATADAY) 0.2 % SOLN Apply 1 drop to eye as needed. For allergies 12/27/17   Lucila Maine C, DO  ondansetron (ZOFRAN) 4 MG tablet Take 1 tablet (4 mg total) by mouth every 8 (eight) hours as needed for nausea or vomiting. Patient not taking: Reported on 07/05/2019 03/16/16   Governor Specking, MD    Allergies    Latex  Review of Systems   Review of Systems  Gastrointestinal: Positive for nausea and vomiting.  Neurological: Positive for dizziness, weakness and light-headedness.  All other systems reviewed and are negative.   Physical Exam Updated Vital Signs BP (!) 152/88   Pulse 80   Temp 97.9 F (36.6 C) (Oral)   Resp 18   Ht 5\' 3"  (1.6 m)   Wt 108.9 kg   SpO2 100%    BMI 42.51 kg/m   Physical Exam Vitals and nursing note reviewed.  Constitutional:      General: She is not in acute distress.    Appearance: Normal appearance. She is well-developed.  HENT:     Head: Normocephalic and atraumatic.     Right Ear: Hearing normal.     Left Ear: Hearing normal.     Nose: Nose normal.  Eyes:     Conjunctiva/sclera: Conjunctivae normal.     Pupils: Pupils are equal, round, and reactive to light.  Cardiovascular:     Rate and Rhythm: Regular rhythm.     Heart sounds: S1 normal and S2 normal. No murmur. No friction rub. No gallop.   Pulmonary:     Effort: Pulmonary effort is normal. No respiratory distress.     Breath sounds: Normal breath sounds.  Chest:     Chest wall: No tenderness.  Abdominal:     General: Bowel sounds are normal.     Palpations: Abdomen is soft.     Tenderness: There is no abdominal tenderness. There is no guarding or rebound. Negative signs include Murphy's sign and McBurney's sign.     Hernia: No hernia is present.     Comments: Gravid abdomen, nontender  Musculoskeletal:        General: Normal range of motion.     Cervical back: Normal range of motion and neck supple.  Skin:    General: Skin is warm and dry.     Findings: No rash.  Neurological:     Mental Status: She is alert and oriented to person, place, and time.     GCS: GCS eye subscore is 4. GCS verbal subscore is 5. GCS motor subscore is 6.     Cranial Nerves: No cranial nerve deficit.     Sensory: No sensory deficit.     Coordination: Coordination normal.     Deep Tendon Reflexes:     Reflex Scores:      Patellar reflexes are 1+ on the right side and 1+ on the left side. Psychiatric:        Speech: Speech normal.        Behavior: Behavior normal.        Thought Content: Thought content normal.     ED Results / Procedures / Treatments   Labs (all labs ordered are listed, but only abnormal results are displayed) Labs Reviewed  CBC  URINALYSIS, ROUTINE W  REFLEX MICROSCOPIC  COMPREHENSIVE METABOLIC PANEL    EKG None  Radiology No results found.  Procedures Procedures (including critical care time)  Medications Ordered in ED Medications  sodium chloride 0.9 % bolus 1,000 mL (has no administration in time range)  ondansetron (ZOFRAN) injection 4 mg (has no administration in time range)    ED Course  I have reviewed the triage vital signs and the nursing notes.  Pertinent labs & imaging results that were available during my care of the patient were reviewed by me and considered in my medical decision making (see chart for details).    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  She actually did have some nausea and vomited once earlier tonight before coming to work, but while here she developed worsening generalized weakness, feeling lightheaded.  She is [redacted] weeks pregnant.  No abdominal pain, vaginal bleeding, vaginal fluid loss.  She has not experiencing contractions.  Patient noted to be hypertensive at arrival.  She reports that this has been monitored by her OB/GYN, blood pressure goes up when she gets anxious but then gets better.  She also has had some protein in her urine so she is being monitored for possible early preeclampsia.  Blood pressure 152/88 at arrival.  She did self corrected 136/78 without any intervention.  She does have 100 protein in her urine today, but she has had this amount of proteinuria with previous urinalysis seen in our records as well when she was not pregnant.  Unclear if this is chronic proteinuria.  Patient not experiencing any headache, blurred vision.  She feels improvement after Zofran and IV fluids.  Discussed with Dr. Melba Coon, on-call for her OB/GYN group.  Results of labs, blood pressure and exam were discussed.  As patient has follow-up in 2 days in the office, no interventions currently.  Patient to be discharged, strict  preeclampsia return precautions given.  Final Clinical Impression(s) / ED Diagnoses Final diagnoses:  None    Rx / DC Orders ED Discharge Orders    None       Yer Olivencia, Gwenyth Allegra, MD 10/19/19 [phone]

## 2019-10-19 NOTE — Discharge Instructions (Addendum)
See your OB/GYN on Monday as scheduled.  If you develop any headache, blurred vision, flashing spots before your eyes, etc., return to the ER immediately.  If possible check your blood pressure once daily.

## 2019-10-19 NOTE — ED Triage Notes (Signed)
Pt with c/o dizziness with gait disturbance and emesis that started yesterday.

## 2019-10-19 NOTE — ED Notes (Signed)
Patient ambulated and patient still felt a little dizzy but not as severe as when she arrived in the ED. Patient called her husband and he is on the way to pick her up to drive her home. Patient remaining on the cardiac monitor with IV in place until husband arrives and patient is discharged from the ED.

## 2019-10-30 ENCOUNTER — Encounter (HOSPITAL_COMMUNITY): Payer: Self-pay

## 2019-10-30 ENCOUNTER — Other Ambulatory Visit: Payer: Self-pay

## 2019-10-30 ENCOUNTER — Ambulatory Visit (HOSPITAL_COMMUNITY)
Admission: RE | Admit: 2019-10-30 | Discharge: 2019-10-30 | Disposition: A | Discharge status: Home / Self Care | Payer: No Typology Code available for payment source | Source: Ambulatory Visit | Attending: Obstetrics and Gynecology | Admitting: Obstetrics and Gynecology

## 2019-10-30 ENCOUNTER — Other Ambulatory Visit (HOSPITAL_COMMUNITY): Payer: Self-pay | Admitting: Obstetrics and Gynecology

## 2019-10-30 ENCOUNTER — Ambulatory Visit (HOSPITAL_COMMUNITY): Payer: No Typology Code available for payment source | Admitting: *Deleted

## 2019-10-30 VITALS — BP 132/65 | HR 72 | Temp 96.8°F

## 2019-10-30 DIAGNOSIS — O09813 Supervision of pregnancy resulting from assisted reproductive technology, third trimester: Secondary | ICD-10-CM

## 2019-10-30 DIAGNOSIS — Z362 Encounter for other antenatal screening follow-up: Secondary | ICD-10-CM

## 2019-10-30 DIAGNOSIS — O43193 Other malformation of placenta, third trimester: Secondary | ICD-10-CM

## 2019-10-30 DIAGNOSIS — O289 Unspecified abnormal findings on antenatal screening of mother: Secondary | ICD-10-CM | POA: Diagnosis not present

## 2019-10-30 DIAGNOSIS — Z3A34 34 weeks gestation of pregnancy: Secondary | ICD-10-CM

## 2019-10-30 DIAGNOSIS — O4403 Placenta previa specified as without hemorrhage, third trimester: Secondary | ICD-10-CM | POA: Insufficient documentation

## 2019-10-30 DIAGNOSIS — O4413 Placenta previa with hemorrhage, third trimester: Secondary | ICD-10-CM

## 2019-10-30 DIAGNOSIS — O09523 Supervision of elderly multigravida, third trimester: Secondary | ICD-10-CM

## 2019-10-30 DIAGNOSIS — D259 Leiomyoma of uterus, unspecified: Secondary | ICD-10-CM

## 2019-10-30 DIAGNOSIS — O3429 Maternal care due to uterine scar from other previous surgery: Secondary | ICD-10-CM

## 2019-10-30 DIAGNOSIS — O3413 Maternal care for benign tumor of corpus uteri, third trimester: Secondary | ICD-10-CM | POA: Diagnosis not present

## 2019-10-30 DIAGNOSIS — O09529 Supervision of elderly multigravida, unspecified trimester: Secondary | ICD-10-CM

## 2019-10-31 ENCOUNTER — Encounter (HOSPITAL_COMMUNITY): Payer: Self-pay

## 2019-10-31 ENCOUNTER — Telehealth (HOSPITAL_COMMUNITY): Payer: Self-pay | Admitting: *Deleted

## 2019-10-31 ENCOUNTER — Other Ambulatory Visit (HOSPITAL_COMMUNITY): Payer: Self-pay | Admitting: *Deleted

## 2019-10-31 NOTE — Patient Instructions (Signed)
Stacey Carlson  10/31/2019   Your procedure is scheduled on:  11/15/2019  Arrive at 0800 at Entrance C on [address] at [facility]  and [employer]. You are invited to use the FREE valet parking or use the Visitor's parking deck.  Pick up the phone at the desk and dial [phone].  Call this number if you have problems the morning of surgery: [phone]  Remember:   Do not eat food:(After Midnight) Desps de medianoche.  Do not drink clear liquids: (After Midnight) Desps de medianoche.  Take these medicines the morning of surgery with A SIP OF WATER:  none   Do not wear jewelry, make-up or nail polish.  Do not wear lotions, powders, or perfumes. Do not wear deodorant.  Do not shave 48 hours prior to surgery.  Do not bring valuables to the hospital.  [facility] is not   responsible for any belongings or valuables brought to the hospital.  Contacts, dentures or bridgework may not be worn into surgery.  Leave suitcase in the car. After surgery it may be brought to your room.  For patients admitted to the hospital, checkout time is 11:00 AM the day of              discharge.      Please read over the following fact sheets that you were given:     Preparing for Surgery

## 2019-10-31 NOTE — Telephone Encounter (Signed)
Preadmission screen  

## 2019-11-07 ENCOUNTER — Encounter (HOSPITAL_COMMUNITY): Payer: Self-pay

## 2019-11-07 ENCOUNTER — Ambulatory Visit (HOSPITAL_COMMUNITY)
Admission: RE | Admit: 2019-11-07 | Discharge: 2019-11-07 | Disposition: A | Discharge status: Home / Self Care | Payer: No Typology Code available for payment source | Source: Ambulatory Visit | Attending: Obstetrics and Gynecology | Admitting: Obstetrics and Gynecology

## 2019-11-07 ENCOUNTER — Other Ambulatory Visit: Payer: Self-pay

## 2019-11-07 ENCOUNTER — Ambulatory Visit (HOSPITAL_COMMUNITY): Payer: No Typology Code available for payment source | Admitting: *Deleted

## 2019-11-07 VITALS — BP 133/74 | HR 73 | Temp 97.7°F

## 2019-11-07 DIAGNOSIS — O3413 Maternal care for benign tumor of corpus uteri, third trimester: Secondary | ICD-10-CM

## 2019-11-07 DIAGNOSIS — O09523 Supervision of elderly multigravida, third trimester: Secondary | ICD-10-CM

## 2019-11-07 DIAGNOSIS — O43193 Other malformation of placenta, third trimester: Secondary | ICD-10-CM

## 2019-11-07 DIAGNOSIS — O365931 Maternal care for other known or suspected poor fetal growth, third trimester, fetus 1: Secondary | ICD-10-CM | POA: Diagnosis not present

## 2019-11-07 DIAGNOSIS — O3429 Maternal care due to uterine scar from other previous surgery: Secondary | ICD-10-CM

## 2019-11-07 DIAGNOSIS — O09813 Supervision of pregnancy resulting from assisted reproductive technology, third trimester: Secondary | ICD-10-CM

## 2019-11-07 DIAGNOSIS — O289 Unspecified abnormal findings on antenatal screening of mother: Secondary | ICD-10-CM

## 2019-11-07 DIAGNOSIS — O4413 Placenta previa with hemorrhage, third trimester: Secondary | ICD-10-CM

## 2019-11-07 DIAGNOSIS — D259 Leiomyoma of uterus, unspecified: Secondary | ICD-10-CM

## 2019-11-07 DIAGNOSIS — Z3A36 36 weeks gestation of pregnancy: Secondary | ICD-10-CM

## 2019-11-07 DIAGNOSIS — Z3A35 35 weeks gestation of pregnancy: Secondary | ICD-10-CM | POA: Insufficient documentation

## 2019-11-08 NOTE — H&P (Signed)
Stacey Carlson is a 45 y.o.G75P1011 female presenting for scheduled primary cesarean section. Pt is dated per IVF dating and also had a 7week [place]. Pregnancy was complicated by complete previa, preE - mild and hx myomectomy. Pt also with GDMA1, BMI >40, hx asthma and mitral valve prolapse.  Pt has done well in pregnancy otherwise. NSTs twice weekly in past 5 weeks. GBS pending. She had a negative first trimester screen. She declined panorama   OB History    Gravida  3   Para  1   Term  1   Preterm      AB  1   Living  1     SAB  1   TAB      Ectopic      Multiple      Live Births  1          Past Medical History:  Diagnosis Date  . Asthma    rarely uses inhaler - exercise induced   . Fibroid   . Fibroids   . GDM (gestational diabetes mellitus) 2020  . Gestational diabetes   . Murmur   . Pre-eclampsia   . Seasonal allergies   . SVD (spontaneous vaginal delivery)    x 1   Past Surgical History:  Procedure Laterality Date  . CHROMOPERTUBATION  03/16/2016   Procedure: CHROMOPERTUBATION;  Surgeon: Governor Specking, MD;  Location: [facility];  Service: Gynecology;;  . KNEE SURGERY Right    x 2  . LAPAROSCOPY  03/16/2016   Procedure: LAPAROSCOPY OPERATIVE;  Surgeon: Governor Specking, MD;  Location: [facility];  Service: Gynecology;;  . MYOMECTOMY  03/16/2016   Procedure: MYOMECTOMY;  Surgeon: Governor Specking, MD;  Location: [facility];  Service: Gynecology;;  . WISDOM TOOTH EXTRACTION     Family History: family history includes COPD in her maternal grandfather; Diabetes in her father, mother, and paternal grandfather; Heart disease in her maternal grandmother; Hypertension in her mother; Kidney disease in her father and sister; Seizures in her sister; Stroke in her paternal grandmother. Social History:  reports that she has never smoked. She has never used smokeless tobacco. She reports that she does not drink alcohol or use drugs.     Maternal Diabetes: Yes:  Diabetes Type:   Diet controlled Genetic Screening: Normal Maternal Ultrasounds/Referrals: Normal Fetal Ultrasounds or other Referrals:  None Maternal Substance Abuse:  No Significant Maternal Medications:  Meds include: Other: albuterol, aspirin and protonix Significant Maternal Lab Results:  Group B Strep negative Other Comments:  None  Review of Systems  Constitutional: Negative for activity change, appetite change and fatigue.  Respiratory: Negative for shortness of breath.   Cardiovascular: Negative for palpitations and leg swelling.  Gastrointestinal: Negative for abdominal pain.  Endocrine: Negative for cold intolerance and heat intolerance.  Musculoskeletal: Negative for back pain.  Neurological: Negative for headaches.  Psychiatric/Behavioral: The patient is not nervous/anxious.    Maternal Medical History:  Reason for admission: Scheduled surgery  Contractions: Frequency: rare.    Fetal activity: Perceived fetal activity is normal.    Prenatal complications: Pre-eclampsia.   Prenatal Complications - Diabetes: gestational. Diabetes is managed by diet.        There were no vitals taken for this visit. Maternal Exam:  Uterine Assessment: Contraction strength is mild.  Contraction frequency is rare.   Abdomen: Patient reports no abdominal tenderness. Estimated fetal weight is AGA.   Fetal presentation: vertex  Introitus: Normal vulva. Normal vagina.  Pelvis: of concern for delivery.  Physical Exam  Constitutional: She is oriented to person, place, and time. She appears well-developed and well-nourished.  Cardiovascular: Normal rate.  Respiratory: Effort normal.  GI: Soft. Bowel sounds are normal.  Genitourinary:    Vulva, vagina and uterus normal.   Musculoskeletal:        General: Normal range of motion.     Cervical back: Normal range of motion.  Neurological: She is alert and oriented to person, place, and time.  Skin: Skin is warm and dry.  Psychiatric: She has a  normal mood and affect. Her behavior is normal. Judgment and thought content normal.    Prenatal labs: ABO, Rh: AB/Positive/-- (07/15 0000) Antibody: Negative (07/15 0000) Rubella: Immune (07/15 0000) RPR: Nonreactive (07/15 0000)  HBsAg: Negative (07/15 0000)  HIV: Non-reactive (07/15 0000)  GBS:     Assessment/Plan: Pt is a 44yo G16P1011 female presenting for primary cesarean section due to complete previa, preE - mild and hx myomectomy. Pt also with GDMA1, BMI >40, hx asthma  Admit CBC, T&S Spinal pre-procedure NPO per ERAS protocol To OR when ready  Venetia Night Malik Paar 11/08/2019, 4:32 PM

## 2019-11-09 MED FILL — FREESTYLE LITE TEST STRIP: 25 days supply | Qty: 100 | Fill #0

## 2019-11-12 NOTE — Anesthesia Preprocedure Evaluation (Addendum)
Anesthesia Evaluation  Patient identified by MRN, date of birth, ID band Patient awake    Reviewed: Allergy & Precautions, NPO status , Patient's Chart, lab work & pertinent test results  History of Anesthesia Complications Negative for: history of anesthetic complications  Airway Mallampati: II  TM Distance: >3 FB Neck ROM: Full    Dental no notable dental hx.    Pulmonary asthma ,  Covid-19 positive, asymptomatic   Pulmonary exam normal        Cardiovascular hypertension, Normal cardiovascular exam+ Valvular Problems/Murmurs MVP      Neuro/Psych negative neurological ROS  negative psych ROS   GI/Hepatic negative GI ROS, Neg liver ROS,   Endo/Other  diabetes, GestationalMorbid obesity  Renal/GU negative Renal ROS  negative genitourinary   Musculoskeletal negative musculoskeletal ROS (+)   Abdominal   Peds  Hematology  (+) anemia , Hgb 10.7   Anesthesia Other Findings Day of surgery medications reviewed with patient.  Reproductive/Obstetrics (+) Pregnancy Placenta previa, h/o myomectomy, preE                         Anesthesia Physical Anesthesia Plan  ASA: III  Anesthesia Plan: Spinal   Post-op Pain Management:    Induction:   PONV Risk Score and Plan: 4 or greater and Treatment may vary due to age or medical condition, Ondansetron and Dexamethasone  Airway Management Planned: Natural Airway  Additional Equipment: None  Intra-op Plan:   Post-operative Plan:   Informed Consent: I have reviewed the patients History and Physical, chart, labs and discussed the procedure including the risks, benefits and alternatives for the proposed anesthesia with the patient or authorized representative who has indicated his/her understanding and acceptance.       Plan Discussed with: CRNA  Anesthesia Plan Comments:        Anesthesia Quick Evaluation

## 2019-11-13 ENCOUNTER — Other Ambulatory Visit: Payer: Self-pay

## 2019-11-13 ENCOUNTER — Other Ambulatory Visit (HOSPITAL_COMMUNITY)
Admission: RE | Admit: 2019-11-13 | Discharge: 2019-11-13 | Disposition: A | Discharge status: Home / Self Care | Payer: No Typology Code available for payment source | Source: Ambulatory Visit | Attending: Obstetrics and Gynecology | Admitting: Obstetrics and Gynecology

## 2019-11-13 DIAGNOSIS — Z01818 Encounter for other preprocedural examination: Secondary | ICD-10-CM | POA: Insufficient documentation

## 2019-11-13 DIAGNOSIS — U071 COVID-19: Secondary | ICD-10-CM | POA: Insufficient documentation

## 2019-11-13 HISTORY — DX: Benign neoplasm of connective and other soft tissue, unspecified: D21.9

## 2019-11-13 HISTORY — DX: COVID-19: U07.1

## 2019-11-13 LAB — CBC
HCT: 33.3 % — ABNORMAL LOW (ref 36.0–46.0)
Hemoglobin: 10.7 g/dL — ABNORMAL LOW (ref 12.0–15.0)
MCH: 27.1 pg (ref 26.0–34.0)
MCHC: 32.1 g/dL (ref 30.0–36.0)
MCV: 84.3 fL (ref 80.0–100.0)
Platelets: 269 10*3/uL (ref 150–400)
RBC: 3.95 MIL/uL (ref 3.87–5.11)
RDW: 14 % (ref 11.5–15.5)
WBC: 4.9 10*3/uL (ref 4.0–10.5)
nRBC: 0 % (ref 0.0–0.2)

## 2019-11-13 LAB — TYPE AND SCREEN
ABO/RH(D): AB POS
Antibody Screen: NEGATIVE

## 2019-11-13 LAB — ABO/RH: ABO/RH(D): AB POS

## 2019-11-13 LAB — SARS CORONAVIRUS 2 (TAT 6-24 HRS): SARS Coronavirus 2: POSITIVE — AB

## 2019-11-13 NOTE — MAU Note (Signed)
Covid swab collected.Pt tolerated well.Asymptomatic.CBC/RPR/ Type and screen drawn by lab

## 2019-11-14 ENCOUNTER — Telehealth: Payer: Self-pay | Admitting: Nurse Practitioner

## 2019-11-14 ENCOUNTER — Encounter: Payer: Self-pay | Admitting: Medical

## 2019-11-14 DIAGNOSIS — U071 COVID-19: Secondary | ICD-10-CM | POA: Insufficient documentation

## 2019-11-14 LAB — RPR: RPR Ser Ql: NONREACTIVE

## 2019-11-14 NOTE — Telephone Encounter (Signed)
Called to Discuss with patient about Covid symptoms and the use of bamlanivimab, a monoclonal antibody infusion for those with mild to moderate Covid symptoms and at a high risk of hospitalization.     Pt is qualified for this infusion at the [facility] infusion center due to co-morbid conditions and/or a member of an at-risk group.     Patient Active Problem List   Diagnosis Date Noted  . COVID-19 11/14/2019  . Ulcer of toe of left foot ([place]) 08/15/2018  . 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 Irritation of left eye 05/24/2018  . Encounter for fertility planning 07/13/2015  . ASCUS with positive high risk HPV 03/19/2015  . Well woman exam 02/21/2012  . Palpitations 02/03/2012  . PVC (premature ventricular contraction) 02/03/2012  . History of AGUS pap 2012 10/15/2009  . Obesity, unspecified 08/21/2009  . Allergic rhinitis 08/21/2009    Patient does not have symptoms and does not meet criteria for monoclonal antibody therapy. Symptoms tier reviewed as well as criteria for ending isolation. Preventative practices reviewed. Patient verbalized understanding.    Patient advised to call back if she decides that she has questions. Callback number to the infusion center given. Patient advised to go to Urgent care or ED with severe symptoms.

## 2019-11-15 ENCOUNTER — Encounter (HOSPITAL_COMMUNITY): Payer: Self-pay | Admitting: Obstetrics and Gynecology

## 2019-11-15 ENCOUNTER — Encounter (HOSPITAL_COMMUNITY)
Admission: RE | Disposition: A | Discharge status: Home / Self Care | Payer: Self-pay | Source: Home / Self Care | Attending: Obstetrics and Gynecology

## 2019-11-15 ENCOUNTER — Inpatient Hospital Stay (HOSPITAL_COMMUNITY): Payer: No Typology Code available for payment source | Admitting: Anesthesiology

## 2019-11-15 ENCOUNTER — Inpatient Hospital Stay (HOSPITAL_COMMUNITY)
Admission: RE | Admit: 2019-11-15 | Discharge: 2019-11-18 | DRG: 786 | Disposition: A | Discharge status: Home / Self Care | Payer: No Typology Code available for payment source | Attending: Obstetrics and Gynecology | Admitting: Obstetrics and Gynecology

## 2019-11-15 ENCOUNTER — Other Ambulatory Visit: Payer: Self-pay

## 2019-11-15 DIAGNOSIS — J45909 Unspecified asthma, uncomplicated: Secondary | ICD-10-CM | POA: Diagnosis present

## 2019-11-15 DIAGNOSIS — O9081 Anemia of the puerperium: Secondary | ICD-10-CM | POA: Diagnosis not present

## 2019-11-15 DIAGNOSIS — O9942 Diseases of the circulatory system complicating childbirth: Secondary | ICD-10-CM | POA: Diagnosis present

## 2019-11-15 DIAGNOSIS — U071 COVID-19: Secondary | ICD-10-CM | POA: Diagnosis present

## 2019-11-15 DIAGNOSIS — O9952 Diseases of the respiratory system complicating childbirth: Secondary | ICD-10-CM | POA: Diagnosis present

## 2019-11-15 DIAGNOSIS — O3413 Maternal care for benign tumor of corpus uteri, third trimester: Secondary | ICD-10-CM | POA: Diagnosis present

## 2019-11-15 DIAGNOSIS — O24420 Gestational diabetes mellitus in childbirth, diet controlled: Secondary | ICD-10-CM | POA: Diagnosis present

## 2019-11-15 DIAGNOSIS — Z3A37 37 weeks gestation of pregnancy: Secondary | ICD-10-CM

## 2019-11-15 DIAGNOSIS — O4403 Placenta previa specified as without hemorrhage, third trimester: Secondary | ICD-10-CM | POA: Diagnosis present

## 2019-11-15 DIAGNOSIS — O1404 Mild to moderate pre-eclampsia, complicating childbirth: Secondary | ICD-10-CM | POA: Diagnosis present

## 2019-11-15 DIAGNOSIS — Z01818 Encounter for other preprocedural examination: Secondary | ICD-10-CM

## 2019-11-15 DIAGNOSIS — I341 Nonrheumatic mitral (valve) prolapse: Secondary | ICD-10-CM | POA: Diagnosis present

## 2019-11-15 DIAGNOSIS — O99214 Obesity complicating childbirth: Secondary | ICD-10-CM | POA: Diagnosis present

## 2019-11-15 DIAGNOSIS — O9852 Other viral diseases complicating childbirth: Secondary | ICD-10-CM | POA: Diagnosis present

## 2019-11-15 DIAGNOSIS — D252 Subserosal leiomyoma of uterus: Secondary | ICD-10-CM | POA: Diagnosis present

## 2019-11-15 HISTORY — PX: MYOMECTOMY: SHX85

## 2019-11-15 LAB — CBC
HCT: 30.3 % — ABNORMAL LOW (ref 36.0–46.0)
Hemoglobin: 9.7 g/dL — ABNORMAL LOW (ref 12.0–15.0)
MCH: 26.9 pg (ref 26.0–34.0)
MCHC: 32 g/dL (ref 30.0–36.0)
MCV: 84.2 fL (ref 80.0–100.0)
Platelets: 249 10*3/uL (ref 150–400)
RBC: 3.6 MIL/uL — ABNORMAL LOW (ref 3.87–5.11)
RDW: 14.2 % (ref 11.5–15.5)
WBC: 12 10*3/uL — ABNORMAL HIGH (ref 4.0–10.5)
nRBC: 0 % (ref 0.0–0.2)

## 2019-11-15 LAB — CREATININE, SERUM
Creatinine, Ser: 0.67 mg/dL (ref 0.44–1.00)
GFR calc Af Amer: >60 mL/min (ref >60)
GFR calc non Af Amer: >60 mL/min (ref >60)

## 2019-11-15 LAB — GLUCOSE, CAPILLARY
Glucose-Capillary: 76 mg/dL (ref 70–99)
Glucose-Capillary: 87 mg/dL (ref 70–99)

## 2019-11-15 SURGERY — Surgical Case
Anesthesia: Spinal | Site: Abdomen | Wound class: Clean Contaminated

## 2019-11-15 MED ORDER — PROMETHAZINE HCL 25 MG/ML IJ SOLN: 6.2500 mg | INTRAMUSCULAR | Status: DC | PRN

## 2019-11-15 MED ORDER — METHYLERGONOVINE MALEATE 0.2 MG PO TABS: 0.2000 mg | ORAL_TABLET | ORAL | Status: DC | PRN

## 2019-11-15 MED ORDER — KETOROLAC TROMETHAMINE 30 MG/ML IJ SOLN: 30.0000 mg | Freq: Four times a day (QID) | INTRAMUSCULAR | Status: AC | PRN

## 2019-11-15 MED ORDER — ALBUTEROL SULFATE HFA 108 (90 BASE) MCG/ACT IN AERS
1.0000 | INHALATION_SPRAY | RESPIRATORY_TRACT | Status: DC | PRN
  Filled 2019-11-15: qty 6.7

## 2019-11-15 MED ORDER — DIBUCAINE (PERIANAL) 1 % EX OINT: 1.0000 "application " | TOPICAL_OINTMENT | CUTANEOUS | Status: DC | PRN

## 2019-11-15 MED ORDER — NALOXONE HCL 4 MG/10ML IJ SOLN
1.0000 ug/kg/h | INTRAVENOUS | Status: DC | PRN
  Filled 2019-11-15: qty 5

## 2019-11-15 MED ORDER — KETOROLAC TROMETHAMINE 30 MG/ML IJ SOLN: 30.0000 mg | Freq: Once | INTRAMUSCULAR | Status: DC

## 2019-11-15 MED ORDER — ONDANSETRON HCL 4 MG/2ML IJ SOLN
INTRAMUSCULAR | Status: AC
  Filled 2019-11-15: qty 2

## 2019-11-15 MED ORDER — SOD CITRATE-CITRIC ACID 500-334 MG/5ML PO SOLN
30.0000 mL | ORAL | Status: AC
  Administered 2019-11-15: 30 mL via ORAL

## 2019-11-15 MED ORDER — COCONUT OIL OIL: 1.0000 "application " | TOPICAL_OIL | Status: DC | PRN

## 2019-11-15 MED ORDER — ACETAMINOPHEN 500 MG PO TABS
1000.0000 mg | ORAL_TABLET | Freq: Four times a day (QID) | ORAL | Status: AC
  Administered 2019-11-15 – 2019-11-16 (×4): 1000 mg via ORAL
  Filled 2019-11-15 (×4): qty 2

## 2019-11-15 MED ORDER — PHENYLEPHRINE HCL-NACL 20-0.9 MG/250ML-% IV SOLN
INTRAVENOUS | Status: DC | PRN
  Administered 2019-11-15: 60 ug/min via INTRAVENOUS

## 2019-11-15 MED ORDER — ACETAMINOPHEN 500 MG PO TABS
1000.0000 mg | ORAL_TABLET | ORAL | Status: AC
  Administered 2019-11-15: 09:00:00 1000 mg via ORAL

## 2019-11-15 MED ORDER — OXYTOCIN 40 UNITS IN NORMAL SALINE INFUSION - SIMPLE MED: 2.5000 [IU]/h | INTRAVENOUS | Status: AC

## 2019-11-15 MED ORDER — BUPIVACAINE IN DEXTROSE 0.75-8.25 % IT SOLN
INTRATHECAL | Status: DC | PRN
  Administered 2019-11-15: 1.6 mL via INTRATHECAL

## 2019-11-15 MED ORDER — ONDANSETRON HCL 4 MG/2ML IJ SOLN: 4.0000 mg | Freq: Three times a day (TID) | INTRAMUSCULAR | Status: DC | PRN

## 2019-11-15 MED ORDER — OXYTOCIN 40 UNITS IN NORMAL SALINE INFUSION - SIMPLE MED
INTRAVENOUS | Status: AC
  Filled 2019-11-15: qty 1000

## 2019-11-15 MED ORDER — WITCH HAZEL-GLYCERIN EX PADS: 1.0000 "application " | MEDICATED_PAD | CUTANEOUS | Status: DC | PRN

## 2019-11-15 MED ORDER — SENNOSIDES-DOCUSATE SODIUM 8.6-50 MG PO TABS
2.0000 | ORAL_TABLET | ORAL | Status: DC
  Administered 2019-11-16 – 2019-11-17 (×3): 2 via ORAL
  Filled 2019-11-15 (×3): qty 2

## 2019-11-15 MED ORDER — MORPHINE SULFATE (PF) 0.5 MG/ML IJ SOLN
INTRAMUSCULAR | Status: DC | PRN
  Administered 2019-11-15: .15 mg via INTRATHECAL

## 2019-11-15 MED ORDER — NALBUPHINE HCL 10 MG/ML IJ SOLN: 5.0000 mg | INTRAMUSCULAR | Status: DC | PRN

## 2019-11-15 MED ORDER — OXYTOCIN 40 UNITS IN NORMAL SALINE INFUSION - SIMPLE MED
INTRAVENOUS | Status: DC | PRN
  Administered 2019-11-15: 40 mL via INTRAVENOUS

## 2019-11-15 MED ORDER — SIMETHICONE 80 MG PO CHEW
80.0000 mg | CHEWABLE_TABLET | Freq: Three times a day (TID) | ORAL | Status: DC
  Administered 2019-11-15 – 2019-11-18 (×7): 80 mg via ORAL
  Filled 2019-11-15 (×7): qty 1

## 2019-11-15 MED ORDER — NALOXONE HCL 0.4 MG/ML IJ SOLN: 0.4000 mg | INTRAMUSCULAR | Status: DC | PRN

## 2019-11-15 MED ORDER — DEXTROSE 5 % IV SOLN
3.0000 g | Freq: Once | INTRAVENOUS | Status: AC
  Administered 2019-11-15: 3 g via INTRAVENOUS

## 2019-11-15 MED ORDER — TETANUS-DIPHTH-ACELL PERTUSSIS 5-2.5-18.5 LF-MCG/0.5 IM SUSP: 0.5000 mL | Freq: Once | INTRAMUSCULAR | Status: DC

## 2019-11-15 MED ORDER — LACTATED RINGERS IV SOLN: INTRAVENOUS | Status: DC | PRN

## 2019-11-15 MED ORDER — ZOLPIDEM TARTRATE 5 MG PO TABS: 5.0000 mg | ORAL_TABLET | Freq: Every evening | ORAL | Status: DC | PRN

## 2019-11-15 MED ORDER — PHENYLEPHRINE 40 MCG/ML (10ML) SYRINGE FOR IV PUSH (FOR BLOOD PRESSURE SUPPORT)
PREFILLED_SYRINGE | INTRAVENOUS | Status: AC
  Filled 2019-11-15: qty 10

## 2019-11-15 MED ORDER — ENOXAPARIN SODIUM 40 MG/0.4ML ~~LOC~~ SOLN: 40.0000 mg | SUBCUTANEOUS | Status: DC

## 2019-11-15 MED ORDER — ACETAMINOPHEN 500 MG PO TABS
ORAL_TABLET | ORAL | Status: AC
  Filled 2019-11-15: qty 2

## 2019-11-15 MED ORDER — DIPHENHYDRAMINE HCL 25 MG PO CAPS: 25.0000 mg | ORAL_CAPSULE | ORAL | Status: DC | PRN

## 2019-11-15 MED ORDER — MORPHINE SULFATE (PF) 0.5 MG/ML IJ SOLN
INTRAMUSCULAR | Status: AC
  Filled 2019-11-15: qty 10

## 2019-11-15 MED ORDER — METHYLERGONOVINE MALEATE 0.2 MG/ML IJ SOLN: 0.2000 mg | INTRAMUSCULAR | Status: DC | PRN

## 2019-11-15 MED ORDER — TRAMADOL HCL 50 MG PO TABS
50.0000 mg | ORAL_TABLET | Freq: Four times a day (QID) | ORAL | Status: DC | PRN
  Administered 2019-11-16 – 2019-11-17 (×2): 50 mg via ORAL
  Filled 2019-11-15 (×2): qty 1

## 2019-11-15 MED ORDER — NALBUPHINE HCL 10 MG/ML IJ SOLN: 5.0000 mg | Freq: Once | INTRAMUSCULAR | Status: DC | PRN

## 2019-11-15 MED ORDER — SODIUM CHLORIDE 0.9 % IR SOLN
Status: DC | PRN
  Administered 2019-11-15: 1000 mL

## 2019-11-15 MED ORDER — FENTANYL CITRATE (PF) 100 MCG/2ML IJ SOLN
INTRAMUSCULAR | Status: AC
  Filled 2019-11-15: qty 2

## 2019-11-15 MED ORDER — DIPHENHYDRAMINE HCL 50 MG/ML IJ SOLN
12.5000 mg | INTRAMUSCULAR | Status: DC | PRN
  Administered 2019-11-15: 16:00:00 12.5 mg via INTRAVENOUS
  Filled 2019-11-15: qty 1

## 2019-11-15 MED ORDER — PRENATAL MULTIVITAMIN CH
1.0000 | ORAL_TABLET | Freq: Every day | ORAL | Status: DC
  Administered 2019-11-15 – 2019-11-17 (×3): 1 via ORAL
  Filled 2019-11-15 (×3): qty 1

## 2019-11-15 MED ORDER — FENTANYL CITRATE (PF) 100 MCG/2ML IJ SOLN
INTRAMUSCULAR | Status: DC | PRN
  Administered 2019-11-15: 15 ug via INTRATHECAL

## 2019-11-15 MED ORDER — ONDANSETRON HCL 4 MG/2ML IJ SOLN
INTRAMUSCULAR | Status: DC | PRN
  Administered 2019-11-15: 4 mg via INTRAVENOUS

## 2019-11-15 MED ORDER — SOD CITRATE-CITRIC ACID 500-334 MG/5ML PO SOLN
ORAL | Status: AC
  Filled 2019-11-15: qty 30

## 2019-11-15 MED ORDER — SODIUM CHLORIDE 0.9 % IV SOLN: INTRAVENOUS | Status: DC | PRN

## 2019-11-15 MED ORDER — FENTANYL CITRATE (PF) 100 MCG/2ML IJ SOLN
25.0000 ug | INTRAMUSCULAR | Status: DC | PRN
  Administered 2019-11-15: 13:00:00 25 ug via INTRAVENOUS

## 2019-11-15 MED ORDER — SIMETHICONE 80 MG PO CHEW
80.0000 mg | CHEWABLE_TABLET | ORAL | Status: DC | PRN
  Filled 2019-11-15: qty 1

## 2019-11-15 MED ORDER — LACTATED RINGERS IV SOLN: INTRAVENOUS | Status: DC

## 2019-11-15 MED ORDER — DIPHENHYDRAMINE HCL 25 MG PO CAPS: 25.0000 mg | ORAL_CAPSULE | Freq: Four times a day (QID) | ORAL | Status: DC | PRN

## 2019-11-15 MED ORDER — DEXAMETHASONE SODIUM PHOSPHATE 10 MG/ML IJ SOLN
INTRAMUSCULAR | Status: DC | PRN
  Administered 2019-11-15: 10 mg via INTRAVENOUS

## 2019-11-15 MED ORDER — KETOROLAC TROMETHAMINE 30 MG/ML IJ SOLN
INTRAMUSCULAR | Status: AC
  Filled 2019-11-15: qty 1

## 2019-11-15 MED ORDER — DEXTROSE 5 % IV SOLN
INTRAVENOUS | Status: AC
  Filled 2019-11-15: qty 3000

## 2019-11-15 MED ORDER — MENTHOL 3 MG MT LOZG: 1.0000 | LOZENGE | OROMUCOSAL | Status: DC | PRN

## 2019-11-15 MED ORDER — ENOXAPARIN SODIUM 40 MG/0.4ML ~~LOC~~ SOLN
40.0000 mg | SUBCUTANEOUS | Status: DC
  Administered 2019-11-16 – 2019-11-18 (×3): 40 mg via SUBCUTANEOUS
  Filled 2019-11-15 (×3): qty 0.4

## 2019-11-15 MED ORDER — SIMETHICONE 80 MG PO CHEW
80.0000 mg | CHEWABLE_TABLET | ORAL | Status: DC
  Administered 2019-11-16 – 2019-11-17 (×3): 80 mg via ORAL
  Filled 2019-11-15 (×3): qty 1

## 2019-11-15 MED ORDER — PHENYLEPHRINE HCL (PRESSORS) 10 MG/ML IV SOLN
INTRAVENOUS | Status: DC | PRN
  Administered 2019-11-15 (×2): 80 ug via INTRAVENOUS

## 2019-11-15 MED ORDER — OXYCODONE HCL 5 MG PO TABS
5.0000 mg | ORAL_TABLET | ORAL | Status: DC | PRN
  Administered 2019-11-16 – 2019-11-17 (×3): 5 mg via ORAL
  Administered 2019-11-17 (×2): 10 mg via ORAL
  Administered 2019-11-17 – 2019-11-18 (×3): 5 mg via ORAL
  Filled 2019-11-15: qty 1
  Filled 2019-11-15: qty 2
  Filled 2019-11-15: qty 1
  Filled 2019-11-15: qty 2
  Filled 2019-11-15 (×2): qty 1
  Filled 2019-11-15: qty 2
  Filled 2019-11-15: qty 1

## 2019-11-15 MED ORDER — PHENYLEPHRINE HCL-NACL 20-0.9 MG/250ML-% IV SOLN
INTRAVENOUS | Status: AC
  Filled 2019-11-15: qty 250

## 2019-11-15 MED ORDER — STERILE WATER FOR IRRIGATION IR SOLN
Status: DC | PRN
  Administered 2019-11-15: 1000 mL

## 2019-11-15 MED ORDER — SODIUM CHLORIDE 0.9% FLUSH: 3.0000 mL | INTRAVENOUS | Status: DC | PRN

## 2019-11-15 MED ORDER — GABAPENTIN 100 MG PO CAPS
100.0000 mg | ORAL_CAPSULE | Freq: Two times a day (BID) | ORAL | Status: DC
  Administered 2019-11-15 – 2019-11-18 (×6): 100 mg via ORAL
  Filled 2019-11-15 (×6): qty 1

## 2019-11-15 SURGICAL SUPPLY — 43 items
APL SKNCLS STERI-STRIP NONHPOA (GAUZE/BANDAGES/DRESSINGS) ×1
BENZOIN TINCTURE PRP APPL 2/3 (GAUZE/BANDAGES/DRESSINGS) ×1 IMPLANT
CHLORAPREP W/TINT 26ML (MISCELLANEOUS) ×2 IMPLANT
CLAMP CORD UMBIL (MISCELLANEOUS) IMPLANT
CLOSURE STERI STRIP 1/2 X4 (GAUZE/BANDAGES/DRESSINGS) ×2 IMPLANT
CLOTH BEACON ORANGE TIMEOUT ST (SAFETY) ×2 IMPLANT
DRAPE C SECTION CLR SCREEN (DRAPES) ×2 IMPLANT
DRSG OPSITE POSTOP 4X10 (GAUZE/BANDAGES/DRESSINGS) ×2 IMPLANT
ELECT REM PT RETURN 9FT ADLT (ELECTROSURGICAL) ×2
ELECTRODE REM PT RTRN 9FT ADLT (ELECTROSURGICAL) ×1 IMPLANT
EXTRACTOR VACUUM KIWI (MISCELLANEOUS) IMPLANT
GAUZE SPONGE 4X4 12PLY STRL (GAUZE/BANDAGES/DRESSINGS) ×2 IMPLANT
GLOVE BIO SURGEON STRL SZ 6.5 (GLOVE) ×2 IMPLANT
GLOVE BIOGEL PI IND STRL 7.0 (GLOVE) ×2 IMPLANT
GLOVE BIOGEL PI INDICATOR 7.0 (GLOVE) ×2
GOWN STRL REUS W/TWL LRG LVL3 (GOWN DISPOSABLE) ×4 IMPLANT
HEMOSTAT SURGICEL 2X14 (HEMOSTASIS) ×1 IMPLANT
KIT ABG SYR 3ML LUER SLIP (SYRINGE) IMPLANT
NDL HYPO 25X5/8 SAFETYGLIDE (NEEDLE) IMPLANT
NEEDLE HYPO 25X5/8 SAFETYGLIDE (NEEDLE) IMPLANT
NS IRRIG 1000ML POUR BTL (IV SOLUTION) ×2 IMPLANT
PACK C SECTION WH (CUSTOM PROCEDURE TRAY) ×2 IMPLANT
PAD ABD 7.5X8 STRL (GAUZE/BANDAGES/DRESSINGS) ×2 IMPLANT
PAD ABD 8X10 STRL (GAUZE/BANDAGES/DRESSINGS) ×2 IMPLANT
PAD OB MATERNITY 4.3X12.25 (PERSONAL CARE ITEMS) ×2 IMPLANT
RETRACTOR WND ALEXIS 25 LRG (MISCELLANEOUS) ×1 IMPLANT
RTRCTR C-SECT PINK 25CM LRG (MISCELLANEOUS) IMPLANT
RTRCTR WOUND ALEXIS 25CM LRG (MISCELLANEOUS) ×2
SPONGE LAP 18X18 X RAY DECT (DISPOSABLE) ×2 IMPLANT
STRIP CLOSURE SKIN 1/2X4 (GAUZE/BANDAGES/DRESSINGS) ×2 IMPLANT
SUT CHROMIC 1 CTX 36 (SUTURE) ×5 IMPLANT
SUT PLAIN 0 NONE (SUTURE) IMPLANT
SUT PLAIN 2 0 XLH (SUTURE) ×2 IMPLANT
SUT VIC AB 0 CT1 27 (SUTURE) ×4
SUT VIC AB 0 CT1 27XBRD ANBCTR (SUTURE) ×2 IMPLANT
SUT VIC AB 2-0 CT1 27 (SUTURE) ×2
SUT VIC AB 2-0 CT1 TAPERPNT 27 (SUTURE) ×1 IMPLANT
SUT VIC AB 3-0 CT1 27 (SUTURE)
SUT VIC AB 3-0 CT1 TAPERPNT 27 (SUTURE) IMPLANT
SUT VIC AB 4-0 KS 27 (SUTURE) ×2 IMPLANT
TOWEL OR 17X24 6PK STRL BLUE (TOWEL DISPOSABLE) ×2 IMPLANT
TRAY FOLEY W/BAG SLVR 14FR LF (SET/KITS/TRAYS/PACK) ×2 IMPLANT
WATER STERILE IRR 1000ML POUR (IV SOLUTION) ×2 IMPLANT

## 2019-11-15 NOTE — Interval H&P Note (Signed)
History and Physical Interval Note: Pt seen and evaluated. She is asymptomatic for COVID. +Fms. Ready,though nervous, for surgery. Reviewed procedure, risks and expectations  To OR when ready for primary cesarean section due to history of myomectomy Monitor FSBS  11/15/2019 9:41 AM  Stacey Carlson  has presented today for surgery, with the diagnosis of * No pre-op diagnosis entered *.  The various methods of treatment have been discussed with the patient and family. After consideration of risks, benefits and other options for treatment, the patient has consented to  Procedure(s) with comments: CESAREAN SECTION (N/A) - Heather, RNFA as a surgical intervention.  The patient's history has been reviewed, patient examined, no change in status, stable for surgery.  I have reviewed the patient's chart and labs.  Questions were answered to the patient's satisfaction.     Isaiah Serge

## 2019-11-15 NOTE — Op Note (Signed)
Operative Note    Preoperative Diagnosis: 1. IUP at 37weeks 2. Prior myomectomy   3. Anterior placenta previa  Postoperative Diagnosis: Same    4. Abdominal myomectomy   Procedure: Primary cesaren section with abdominal myomectomy   Surgeon: Mickle Mallory DO Assist: Alfonso Patten RNFA  Anesthesia: Spinal  Fluids: LR 188ml EBL: 1465ml UOP: 21ml   Findings: Viable female infant in vertex position, anterior placenta previa, uterine fibroids. Grossly normal uterus, tubes and ovaries. Placenta delivered duncan, mildly adherent Apgars *9,9; weight pending   Specimen: Placenta to pathology, cord gase  Procedure Note  Patient was taken to the operating room where spina anesthesia was administered. It was found to be adequate by Allis clamp test after she was prepped and draped in the normal sterile fashion in the dorsal supine position with a leftward tilt. An appropriate time out was performed prior to start.   A Pfannenstiel skin incision was then made with the scalpel and carried through to the underlying layer of fascia by sharp dissection and Bovie cautery.. There were several bleeding sites that were cauteirzed along thee way.The fascia was nicked in the midline and the incision was extended laterally with Mayo scissors. The superior then inferior aspects of the incision were grasped Coker clamps and dissected off the underlying rectus muscles.Rectus muscles were separated in the midline. Some adhesions - mild - were reduced, and the peritoneal cavity entered bluntly. The peritoneal incision was then extended both superiorly and inferiorly with careful attention to avoid both bowel and bladder. The Alexis self-retaining wound retractor was then placed within the incision and the lower uterine segment exposed. The bladder flap was attempted.There was a 4cm subserosal fibroid noted ini the midline of the lower segment of the uterus. . The lower uterine segment was then incised in a  transverse fashion , under the fibroid,  and the cavity itself entered bluntly. The incision was extended bluntly. Uterine musculature was thick. Anterior placenta was encountered on entry. The infant's head was then lifted and delivered from the incision.  Bulb suction of mouth and nose were performed, then rest of body delivered next. The cord was clamped and cut after a minute delay during which baby had vigorous spontaneous cry. The infant was handed off to the waiting NICU team. Cord ph and gas were obtained and placenta delivered manually due to being  mildly adherent. The uterus was amply cleared.  cord clamped and cut as well.. The  Uterus and gutters were cleared of all clots and debris.3 two. The uterine incision was then repaired in 2 layers the first layer were in a running locked fashion 1-0 chromic and the third an imbricating layer of the same suture. The tubes and ovaries were inspected and the gutters cleared of all clots and debris. The uterine incision was inspected and found to be hemostatic. Surgicel was placed over the incision. All instruments and sponges as well as the Alexis retractor were then removed from the abdomen. The rectus muscles and peritoneum were then reapproximated in a running fashion using  sutures of 2-0 Vicryl. The fascia was then closed with 0 Vicryl in a running fashion. Subcutaneous tissue was reapproximated with 3-0 plain in a running fashion. The skin was closed with a subcuticular stitch of 4-0 Vicryl on a Keith needle and then reinforced with a pressure dressing.  At the conclusion of the procedure all instruments and sponge counts were correct. Patient was taken to the recovery room in good condition with her baby  accompanying her skin to skin.

## 2019-11-15 NOTE — Anesthesia Postprocedure Evaluation (Signed)
Anesthesia Post Note  Patient: Stacey Carlson  Procedure(s) Performed: CESAREAN SECTION (N/A Abdomen) ABDOMINAL MYOMECTOMY (N/A Abdomen)     Patient location during evaluation: PACU Anesthesia Type: Spinal Level of consciousness: awake and alert and oriented Pain management: pain level controlled Vital Signs Assessment: post-procedure vital signs reviewed and stable Respiratory status: spontaneous breathing, nonlabored ventilation and respiratory function stable Cardiovascular status: blood pressure returned to baseline Postop Assessment: no apparent nausea or vomiting, spinal receding, no headache and no backache Anesthetic complications: no                    Brennan Bailey

## 2019-11-15 NOTE — Transfer of Care (Signed)
Immediate Anesthesia Transfer of Care Note  Patient: Stacey Carlson  Procedure(s) Performed: CESAREAN SECTION (N/A Abdomen) ABDOMINAL MYOMECTOMY (N/A Abdomen)  Patient Location: PACU  Anesthesia Type:Spinal  Level of Consciousness: awake and alert   Airway & Oxygen Therapy: Patient Spontanous Breathing  Post-op Assessment: Report given to RN and Post -op Vital signs reviewed and stable  Post vital signs: Reviewed and stable  Last Vitals:  Vitals Value Taken Time  BP 88/57 11/15/19 1146  Temp    Pulse 81 11/15/19 1150  Resp 18 11/15/19 1150  SpO2 99 % 11/15/19 1150  Vitals shown include unvalidated device data.  Last Pain:  Vitals:   11/15/19 0823  TempSrc: Oral         Complications: No apparent anesthesia complications

## 2019-11-15 NOTE — Anesthesia Procedure Notes (Signed)
Spinal  Patient location during procedure: OR Start time: 11/15/2019 10:04 AM End time: 11/15/2019 10:07 AM Staffing Performed: anesthesiologist  Anesthesiologist: Brennan Bailey, MD Preanesthetic Checklist Completed: patient identified, IV checked, risks and benefits discussed, surgical consent, monitors and equipment checked, pre-op evaluation and timeout performed Spinal Block Patient position: sitting Prep: DuraPrep and site prepped and draped Patient monitoring: continuous pulse ox, blood pressure and heart rate Approach: midline Location: L3-4 Injection technique: single-shot Needle Needle type: Pencan  Needle gauge: 24 G Needle length: 9 cm Additional Notes Risks, benefits, and alternative discussed. Patient gave consent to procedure. Prepped and draped in sitting position. Patient sedated but responsive to voice. Clear CSF obtained after one needle redirection. Positive terminal aspiration. No pain or paraesthesias with injection. Patient tolerated procedure well. Vital signs stable. Tawny Asal, MD

## 2019-11-15 NOTE — Lactation Note (Signed)
This note was copied from a baby's chart. Lactation Consultation Note  Patient Name: Girl Taysia Bajwa [id number] Date: 11/15/2019 Reason for consult: Initial assessment;Early term 37-38.6wks;Infant < 6lbs  I conducted an initial lactation consult with Ms. Tlatelpa and her 32 hour old daughter, Bubba Hales. Ms. Stalsberg donned her mask upon entry. Her support person was also wearing a mask. She was holding [place] and trying to breast feed upon entry. I offered to assist, and she agreed. I first showed Ms. Leis how to hand express. I then helped with latching Bubba Hales in cross cradle hold on her left breast. I instructed Ms. Mis to use a "U" hold position. Baby latched readily with rhythmic suckling sequences. Ms. Spratt denied pain.  Baby tends to turn her head into the breast. Ms. Picaso expressed concerns about if baby can breathe; her breasts are large and soft with everted nipples. I repositioned baby slightly to better see her airway, but baby preferred to turn her head back into the breast. Occasionally she would release the breast and then begin to root. Ms Pick can latch her, but it's slightly shallow. She needed assistance to achieve more depth. I eventually moved her baby to the football hold on the left breast. Bubba Hales seemed more settled in this position.  While feeding, I educated at the bedside. I set up a DEBP and recommended that Ms. Trombetta pump at least one time this evening, one time tonight and several times tomorrow for stimulation purposes. I recommended that she feed EBM back to baby.  We also discussed supplementation due to baby's low birth weight. Ms. Brys was agreeable to supplementation; she prefers to use donor breast milk.   Ms. Wix has her Medela personal pump with her in the room.   The plan is to continue to breast feed baby Bubba Hales on demand 8-12 times a day, post-pump 3-4 times a day and supplement 5-10 mls post feedings. We discussed how her milk volume will increase in  days 3-5.  Ms. Meinhold stated that she was a bit tired, and I reassured her that we could review some of the education tomorrow if she had any questions.  I followed up with assigned RN post visit.  Maternal Data Formula Feeding for Exclusion: No Has patient been taught Hand Expression?: Yes  Feeding Feeding Type: Breast Fed  LATCH Score Latch: Grasps breast easily, tongue down, lips flanged, rhythmical sucking.  Audible Swallowing: A few with stimulation  Type of Nipple: Everted at rest and after stimulation  Comfort (Breast/Nipple): Soft / non-tender  Hold (Positioning): Assistance needed to correctly position infant at breast and maintain latch.  LATCH Score: 8  Interventions Interventions: Breast feeding basics reviewed;Assisted with latch;Skin to skin;Hand express;Breast compression;Adjust position;Support pillows;Position options;DEBP  Lactation Tools Discussed/Used Tools: Pump Breast pump type: Double-Electric Breast Pump Pump Review: Setup, frequency, and cleaning;Milk Storage Initiated by:: hl Date initiated:: 11/15/19   Consult Status Consult Status: Follow-up Date: 11/16/19 Follow-up type: In-patient    Lenore Manner 11/15/2019, 9:21 PM

## 2019-11-16 LAB — CBC
HCT: 26.3 % — ABNORMAL LOW (ref 36.0–46.0)
Hemoglobin: 8.6 g/dL — ABNORMAL LOW (ref 12.0–15.0)
MCH: 27.5 pg (ref 26.0–34.0)
MCHC: 32.7 g/dL (ref 30.0–36.0)
MCV: 84 fL (ref 80.0–100.0)
Platelets: 249 10*3/uL (ref 150–400)
RBC: 3.13 MIL/uL — ABNORMAL LOW (ref 3.87–5.11)
RDW: 14.3 % (ref 11.5–15.5)
WBC: 14.1 10*3/uL — ABNORMAL HIGH (ref 4.0–10.5)
nRBC: 0 % (ref 0.0–0.2)

## 2019-11-16 NOTE — Lactation Note (Signed)
This note was copied from a baby's chart. Lactation Consultation Note  Patient Name: Stacey Carlson [id number] Date: 11/16/2019 Reason for consult: Follow-up assessment;Early term 37-38.6wks;Infant < 6lbs Baby is 27 hours old/5% weight loss.  Mom states baby just finished feeding on both breasts.  Baby is sleeping next to mom.  Baby has not been supplemented since early morning.  Explained to mom that baby needs supplemented with 10-20 mls of formula every 3 hours.  Assisted with pace feeding using the extra slow flow nipple.  Baby did very well with bottle and took 20 mls.  Mom states she has been too uncomfortable to pump.  Discussed asking for her pain meds before pain is too bad.  Reviewed pump use and answered questions.  Instructed to pump after breastfeeding every 3 hours.  Encouraged to call for assist prn.  Maternal Data    Feeding Feeding Type: Bottle Fed - Formula Nipple Type: Extra Slow Flow  LATCH Score                   Interventions    Lactation Tools Discussed/Used     Consult Status Consult Status: Follow-up Date: 11/17/19 Follow-up type: In-patient    Ave Filter 11/16/2019, 2:27 PM

## 2019-11-16 NOTE — Progress Notes (Signed)
Subjective: Postpartum Day #1: Cesarean Delivery, asymptomatic COVID pos Patient reports incisional pain, tolerating PO and no problems voiding.    Objective: Vital signs in last 24 hours: Temp:  [97.6 F (36.4 C)-98.6 F (37 C)] 97.8 F (36.6 C) (01/16 0626) Pulse Rate:  [71-95] 82 (01/16 0626) Resp:  [13-21] 18 (01/16 0626) BP: (82-116)/(51-76) 110/64 (01/16 0626) SpO2:  [98 %-100 %] 100 % (01/16 0626)  Physical Exam:  General: alert Lochia: appropriate Uterine Fundus: firm Incision: dressing C/D/I   Recent Labs    11/15/19 1530 11/16/19 0655  HGB 9.7* 8.6*  HCT 30.3* 26.3*    Assessment/Plan: Status post Cesarean section. Doing well postoperatively. No evidence currently of respiratory issues from [place].  Tolerating anemia so far Continue current care, ambulate.  Remove pressure dressing today, ok to shower.  Stacey Carlson 11/16/2019, 9:54 AM

## 2019-11-17 NOTE — Progress Notes (Signed)
POD #2 LTCS, COVID + Had lots of pain yesterday, doing better today, no other problems Afeb, VSS Abd- soft, fundus firm, incision intact Continue routine care

## 2019-11-17 NOTE — Lactation Note (Signed)
This note was copied from a baby's chart. Lactation Consultation Note  Patient Name: Stacey Carlson YOVZC'H Date: 11/17/2019 Reason for consult: Follow-up assessment;Infant < 6lbs;Early term 37-38.6wks;Infant weight loss  [id number]  F/U visit with P2 mom who delivered @ 37wks, baby is now 96 hours old with 8% wt loss.  Mom states baby is breastfeeding and she is able to latch baby onto the breast easily. Mom states baby falls asleep easily in the football hold position and mom will remove her from the breast in order to wake her up again and then put her back to the breast. Encouraged mom to try feeding baby STS as much as baby's temperature will allow in order to keep baby awake. Mom is bottle feeding formula after breastfeeding sessions. Mom states baby took almost 28m with the last feeding. Mom is feeding baby regularly every 3 hours. Mom states baby takes a bottle easier than her breast.  Reviewed normal newborn feeding behavior and mature milk production on days 3-5.  Mom given green LPTI feeding guidelines and reviewed supplementation amounts according to baby's age.  Mom and Dad seem to have understanding of feeding and supplementation plan for baby.  Mom admits she hasn't pumped that much. Encouraged mom to pump every 3 hours after breastfeeding sessions while Dad is bottle feeding baby. Instructed mom to feed back any amounts obtained via clean finger. Reviewed pump function and cleaning of pump pieces after each use. Unused cleaning supplies at bedside. Demonstrated disassembly of pump pieces. Mom given Medela disinfectant spray and instruction sheet, to be used after cleaning with soap and water. Reviewed milk storage guidelines and referenced mother baby handbook. Mom has her personal Medela DEBP at bedside. Instructed to take her pump kit home at discharge to use as a spare set for her pump.  Reviewed STS as much as possible and alternating breasts and positions with  feedings.  Reviewed IP/OP lactation services and support group information.   Interventions Interventions: Skin to skin;Expressed milk;Position options;DEBP;Breast feeding basics reviewed  Lactation Tools Discussed/Used Breast pump type: Double-Electric Breast Pump Pump Review: Setup, frequency, and cleaning;Milk Storage Initiated by:: reviewed by [employer]  Consult Status Consult Status: Follow-up Date: 11/18/19 Follow-up type: In-patient    CCranston Neighbor1/17/2021, 1:22 PM

## 2019-11-18 LAB — SURGICAL PATHOLOGY

## 2019-11-18 MED ORDER — OXYCODONE HCL 5 MG PO TABS: 5.0000 mg | ORAL_TABLET | Freq: Four times a day (QID) | ORAL | 0 refills | Status: DC | PRN

## 2019-11-18 MED ORDER — PRENATAL MULTIVITAMIN CH: 1.0000 | ORAL_TABLET | Freq: Every day | ORAL | 3 refills | Status: DC

## 2019-11-18 MED ORDER — TRAMADOL HCL 50 MG PO TABS: 50.0000 mg | ORAL_TABLET | Freq: Four times a day (QID) | ORAL | 0 refills | Status: DC | PRN

## 2019-11-18 MED FILL — oxyCODONE HCL 5 MG TABS: 5 | 3 days supply | Qty: 20 | Fill #0

## 2019-11-18 MED FILL — traMADol HCL 50 MG TABS: 50 | 7 days supply | Qty: 30 | Fill #0

## 2019-11-18 NOTE — Progress Notes (Signed)
AVS printed and discharge instructions given to patient. Patient informed to call for follow up appointment and to to pick up prescriptions. Pt verbalized understanding and has no further questions.

## 2019-11-18 NOTE — Discharge Summary (Signed)
OB Discharge Summary     Patient Name: Stacey Carlson DOB: 10-15-1976 MRN: [id number]  Date of admission: 11/15/2019 Delivering MD: Carlynn Purl [facility]   Date of discharge: 11/18/2019  Admitting diagnosis: Indication for care in labor or delivery [O75.9] Intrauterine pregnancy: [redacted]w[redacted]d     Secondary diagnosis:  Active Problems:   Indication for care in labor or delivery   Single delivery by cesarean section  Additional problems:Placenta previa, Uterine myoma     Discharge diagnosis: Term Pregnancy Delivered                                                                                                Post partum procedures:PP anemia  Augmentation: N/A  Complications: None  Hospital course:  Sceduled C/S   44 y.o. yo [id number] at [redacted]w[redacted]d was admitted to the hospital 11/15/2019 for scheduled cesarean section with the following indication:Previa.  Membrane Rupture Time/Date: 10:36 AM ,11/15/2019   Patient delivered a Viable infant.11/15/2019  Details of operation can be found in separate operative note.  Pateint had an uncomplicated postpartum course.  She is ambulating, tolerating a regular diet, passing flatus, and urinating well. Patient is discharged home in stable condition on  11/18/19         Physical exam  Vitals:   11/17/19 0600 11/17/19 1457 11/17/19 2056 11/18/19 0513  BP: (!) 112/58 118/62 122/68 (!) 119/58  Pulse: 100 91 96 90  Resp: 20 18 20 20   Temp: 97.9 F (36.6 C) 98.4 F (36.9 C) 98.1 F (36.7 C) 98.2 F (36.8 C)  TempSrc: Oral Oral Oral Oral  SpO2: 100%  100% 99%  Weight:      Height:       General: alert and no distress Lochia: appropriate Uterine Fundus: firm Incision: Healing well with no significant drainage DVT Evaluation: No evidence of DVT seen on physical exam. Labs: Lab Results  Component Value Date   WBC 14.1 (H) 11/16/2019   HGB 8.6 (L) 11/16/2019   HCT 26.3 (L) 11/16/2019   MCV 84.0 11/16/2019   PLT 249 11/16/2019   CMP Latest Ref  Rng & Units 11/15/2019  Glucose 70 - 99 mg/dL -  BUN 6 - 20 mg/dL -  Creatinine 0.44 - 1.00 mg/dL 0.67  Sodium 135 - 145 mmol/L -  Potassium 3.5 - 5.1 mmol/L -  Chloride 98 - 111 mmol/L -  CO2 22 - 32 mmol/L -  Calcium 8.9 - 10.3 mg/dL -  Total Protein 6.5 - 8.1 g/dL -  Total Bilirubin 0.3 - 1.2 mg/dL -  Alkaline Phos 38 - 126 U/L -  AST 15 - 41 U/L -  ALT 0 - 44 U/L -    Discharge instruction: per After Visit Summary and "Baby and Me Booklet".  After visit meds:  Allergies as of 11/18/2019      Reactions   Latex Other (See Comments)   Latex condoms causes irritation.      Medication List    STOP taking these medications   aspirin EC 81 MG tablet     TAKE these medications   albuterol  108 (90 Base) MCG/ACT inhaler Commonly known as: Ventolin HFA Inhale 1-2 puffs into the lungs every 4 (four) hours as needed. And before exercise   calcium carbonate 500 MG chewable tablet Commonly known as: TUMS - dosed in mg elemental calcium Chew 3-4 tablets by mouth 2 (two) times daily as needed for indigestion or heartburn.   fluticasone 50 MCG/ACT nasal spray Commonly known as: Flonase Place 2 sprays into both nostrils daily. Per nostril   folic acid 1 MG tablet Commonly known as: FOLVITE Take 1 mg by mouth daily.   oxyCODONE 5 MG immediate release tablet Commonly known as: Oxy IR/ROXICODONE Take 1-2 tablets (5-10 mg total) by mouth every 6 (six) hours as needed for moderate pain or severe pain.   prenatal multivitamin Tabs tablet Take 1 tablet by mouth daily at 12 noon.   traMADol 50 MG tablet Commonly known as: ULTRAM Take 1 tablet (50 mg total) by mouth every 6 (six) hours as needed (mild pain).       Diet: routine diet  Activity: Advance as tolerated. Pelvic rest for 6 weeks.   Outpatient follow up:2 and 6 weeks Follow up Appt:No future appointments. Follow up Visit:No follow-ups on file.  Postpartum contraception: Undecided  Newborn Data: Live born female   Birth Weight: 4 lb 15.5 oz (2255 g) APGAR: 8, 9  Newborn Delivery   Birth date/time: 11/15/2019 10:36:00 Delivery type: C-Section, Low Transverse Trial of labor: No C-section categorization: Primary      Baby Feeding: Bottle and Breast Disposition:home with mother   11/18/2019 Janyth Contes, MD

## 2019-11-18 NOTE — Progress Notes (Signed)
Subjective: Postpartum Day 3: Cesarean Delivery/Myomectomy Patient reports incisional pain, tolerating PO and no problems voiding.    Objective: Vital signs in last 24 hours: Temp:  [98.1 F (36.7 C)-98.4 F (36.9 C)] 98.2 F (36.8 C) (01/18 0513) Pulse Rate:  [90-96] 90 (01/18 0513) Resp:  [18-20] 20 (01/18 0513) BP: (118-122)/(58-68) 119/58 (01/18 0513) SpO2:  [99 %-100 %] 99 % (01/18 0513)  Physical Exam:  General: alert and no distress Lochia: appropriate Uterine Fundus: firm Incision: healing well DVT Evaluation: No evidence of DVT seen on physical exam.  Recent Labs    11/15/19 1530 11/16/19 0655  HGB 9.7* 8.6*  HCT 30.3* 26.3*    Assessment/Plan: Status post Cesarean section. Doing well postoperatively.  Discharge home with standard precautions and return to clinic in 2 weeks for incision check. D/C with Motrin, PNV and oxycodone.    Keo Schirmer Bovard-Stuckert 11/18/2019, 7:12 AM

## 2019-12-02 MED FILL — NIFEdipine ER OSMOTIC RELEA: 30 | 30 days supply | Qty: 30 | Fill #0

## 2019-12-30 MED FILL — NIFEDIPINE ER OSMOTIC RELEA: 60 | 60 days supply | Qty: 60 | Fill #0

## 2019-12-31 MED FILL — DICLOFENAC SODIUM 1 % GEL: 1 | 13 days supply | Qty: 100 | Fill #0

## 2020-01-06 MED FILL — METHYLPREDNISOLONE 4 MG TAB: 4 | 6 days supply | Qty: 21 | Fill #0

## 2020-08-18 ENCOUNTER — Other Ambulatory Visit (HOSPITAL_COMMUNITY): Payer: Self-pay | Admitting: Obstetrics and Gynecology

## 2020-08-19 MED FILL — SLYND 4 MG TABS: 4 | 84 days supply | Qty: 84 | Fill #0

## 2020-09-29 ENCOUNTER — Other Ambulatory Visit: Payer: Self-pay

## 2020-09-29 ENCOUNTER — Encounter (HOSPITAL_BASED_OUTPATIENT_CLINIC_OR_DEPARTMENT_OTHER): Payer: Self-pay | Admitting: Obstetrics and Gynecology

## 2020-09-29 NOTE — Progress Notes (Signed)
Spoke w/ via phone for pre-op interview---pt Lab needs dos----    Urine poct           Lab results------ekg 10-19-2019 epic COVID test ------10-03-2020 925 Arrive at -------630 am 10-07-2020 NPO after MN NO Solid Food.  Clear liquids from MN until---530 am then npo Medications to take morning of surgery -----birth control pill, flonase Diabetic medication -----n/a Patient Special Instructions -----none Pre-Op special Istructions -----none Patient verbalized understanding of instructions that were given at this phone interview. Patient denies shortness of breath, chest pain, fever, cough at this phone interview.

## 2020-10-03 ENCOUNTER — Other Ambulatory Visit (HOSPITAL_COMMUNITY)
Admission: RE | Admit: 2020-10-03 | Discharge: 2020-10-03 | Disposition: A | Discharge status: Home / Self Care | Payer: No Typology Code available for payment source | Source: Ambulatory Visit | Attending: Obstetrics and Gynecology | Admitting: Obstetrics and Gynecology

## 2020-10-03 DIAGNOSIS — Z20822 Contact with and (suspected) exposure to covid-19: Secondary | ICD-10-CM | POA: Diagnosis not present

## 2020-10-03 DIAGNOSIS — Z01812 Encounter for preprocedural laboratory examination: Secondary | ICD-10-CM | POA: Insufficient documentation

## 2020-10-03 LAB — SARS CORONAVIRUS 2 (TAT 6-24 HRS): SARS Coronavirus 2: NEGATIVE

## 2020-10-04 NOTE — H&P (Signed)
Stacey Carlson is an 44 y.o. female here for hysteroscopy with novasure ablation due to irregular and painful menses. Pt tried slynd but decided did not want to use daily medication. She has a history of HTN and MVP. Also known history of fibroids ( small; largest was 1.5cm). Recent embx was wnl.   Pertinent Gynecological History: Menses: heavy, irregular and painful  Bleeding: intermenstrual bleeding Contraception: oral progesterone-only contraceptive DES exposure: denies Blood transfusions: none Sexually transmitted diseases: no past history Previous GYN Procedures: n/a   OB History: G3, P2012   Menstrual History: Menarche age: 16 Patient's last menstrual period was 09/15/2020.    Past Medical History:  Diagnosis Date  . Anemia   . Asthma    rarely uses inhaler - exercise induced   . Complication of anesthesia    slow to awaken and feels bad, postop nausea no vomiting , almost passed out after sx with dr Melany Guernsey  . COVID 11/13/2019   asymptomatic per pt  . Fibroid   . Fibroids   . History of gestational diabetes   . Murmur    mild, no cardiologist currently saw cardiology 3-4 yrs ago and released  . PIH (pregnancy induced hypertension)    no meds for last 2 months  . Preeclampsia   . Seasonal allergies   . Single delivery by cesarean section 11/15/2019  . SVD (spontaneous vaginal delivery)    x 1    Past Surgical History:  Procedure Laterality Date  . CESAREAN SECTION N/A 11/15/2019   Procedure: CESAREAN SECTION;  Surgeon: Sherlyn Hay, DO;  Location: [facility];  Service: Obstetrics;  Laterality: N/A;  Heather, RNFA  . CHROMOPERTUBATION  03/16/2016   Procedure: CHROMOPERTUBATION;  Surgeon: Governor Specking, MD;  Location: [facility];  Service: Gynecology;;  . KNEE SURGERY Right yrs ago   x 2  . LAPAROSCOPY  03/16/2016   Procedure: LAPAROSCOPY OPERATIVE;  Surgeon: Governor Specking, MD;  Location: [facility];  Service: Gynecology;;  . MYOMECTOMY  03/16/2016    Procedure: MYOMECTOMY;  Surgeon: Governor Specking, MD;  Location: [facility];  Service: Gynecology;;  . MYOMECTOMY N/A 11/15/2019   Procedure: ABDOMINAL MYOMECTOMY;  Surgeon: Sherlyn Hay, DO;  Location: [facility];  Service: Obstetrics;  Laterality: N/A;  . WISDOM TOOTH EXTRACTION  yrs ago    Family History  Problem Relation Age of Onset  . Hypertension Mother   . Diabetes Mother   . Diabetes Father   . Kidney disease Father   . Seizures Sister   . Kidney disease Sister   . Heart disease Maternal Grandmother   . COPD Maternal Grandfather   . Stroke Paternal Grandmother   . Diabetes Paternal Grandfather     Social History:  reports that she has never smoked. She has never used smokeless tobacco. She reports that she does not drink alcohol and does not use drugs.  Allergies:  Allergies  Allergen Reactions  . Latex Other (See Comments)    Latex condoms causes irritation.    No medications prior to admission.    Review of Systems  Constitutional: Positive for fatigue. Negative for activity change.  Eyes: Negative for visual disturbance.  Respiratory: Negative for chest tightness and shortness of breath.   Cardiovascular: Negative for chest pain, palpitations and leg swelling.  Gastrointestinal: Positive for abdominal pain.  Genitourinary: Positive for menstrual problem and pelvic pain.  Neurological: Negative for headaches.  Psychiatric/Behavioral: The patient is nervous/anxious.   All other systems reviewed and are negative.  Height 5\' 3"  (1.6 m), weight 104.3 kg, last menstrual period 09/15/2020, unknown if currently breastfeeding. Physical Exam Vitals and nursing note reviewed. Exam conducted with a chaperone present.  Constitutional:      Appearance: Normal appearance.  Cardiovascular:     Pulses: Normal pulses.  Pulmonary:     Effort: Pulmonary effort is normal.  Abdominal:     General: Abdomen is flat.  Genitourinary:    General: Normal vulva.   Musculoskeletal:        General: Normal range of motion.     Cervical back: Normal range of motion.  Skin:    General: Skin is warm.     Capillary Refill: Capillary refill takes 2 to 3 seconds.  Neurological:     General: No focal deficit present.     Mental Status: She is alert and oriented to person, place, and time. Mental status is at baseline.  Psychiatric:        Mood and Affect: Mood normal.        Behavior: Behavior normal.        Thought Content: Thought content normal.        Judgment: Judgment normal.     Results for orders placed or performed during the hospital encounter of 10/03/20 (from the past 24 hour(s))  SARS CORONAVIRUS 2 (TAT 6-24 HRS) Nasopharyngeal Nasopharyngeal Swab     Status: None   Collection Time: 10/03/20  9:07 AM   Specimen: Nasopharyngeal Swab  Result Value Ref Range   SARS Coronavirus 2 NEGATIVE NEGATIVE    No results found.  Assessment/Plan: 44yo G3P2012 female here for hysteroscopy with novasure ablation due to irregular menses and painful menses. -Admit -Sars covid screen -ERAS protocol - SCDs to OR - Confirm consent  Isaiah Serge 10/04/2020, 3:54 AM

## 2020-10-07 ENCOUNTER — Ambulatory Visit (HOSPITAL_BASED_OUTPATIENT_CLINIC_OR_DEPARTMENT_OTHER): Payer: No Typology Code available for payment source | Admitting: Anesthesiology

## 2020-10-07 ENCOUNTER — Encounter (HOSPITAL_BASED_OUTPATIENT_CLINIC_OR_DEPARTMENT_OTHER): Payer: Self-pay | Admitting: Obstetrics and Gynecology

## 2020-10-07 ENCOUNTER — Ambulatory Visit (HOSPITAL_BASED_OUTPATIENT_CLINIC_OR_DEPARTMENT_OTHER)
Admission: RE | Admit: 2020-10-07 | Discharge: 2020-10-07 | Disposition: A | Discharge status: Home / Self Care | Payer: No Typology Code available for payment source | Attending: Obstetrics and Gynecology | Admitting: Obstetrics and Gynecology

## 2020-10-07 ENCOUNTER — Other Ambulatory Visit (HOSPITAL_BASED_OUTPATIENT_CLINIC_OR_DEPARTMENT_OTHER): Payer: Self-pay | Admitting: Obstetrics and Gynecology

## 2020-10-07 ENCOUNTER — Encounter (HOSPITAL_BASED_OUTPATIENT_CLINIC_OR_DEPARTMENT_OTHER)
Admission: RE | Disposition: A | Discharge status: Home / Self Care | Payer: Self-pay | Source: Home / Self Care | Attending: Obstetrics and Gynecology

## 2020-10-07 DIAGNOSIS — N926 Irregular menstruation, unspecified: Secondary | ICD-10-CM | POA: Insufficient documentation

## 2020-10-07 DIAGNOSIS — Z8249 Family history of ischemic heart disease and other diseases of the circulatory system: Secondary | ICD-10-CM | POA: Insufficient documentation

## 2020-10-07 DIAGNOSIS — N841 Polyp of cervix uteri: Secondary | ICD-10-CM | POA: Diagnosis not present

## 2020-10-07 DIAGNOSIS — Z833 Family history of diabetes mellitus: Secondary | ICD-10-CM | POA: Insufficient documentation

## 2020-10-07 DIAGNOSIS — Z91040 Latex allergy status: Secondary | ICD-10-CM | POA: Diagnosis not present

## 2020-10-07 DIAGNOSIS — I341 Nonrheumatic mitral (valve) prolapse: Secondary | ICD-10-CM | POA: Diagnosis not present

## 2020-10-07 DIAGNOSIS — N946 Dysmenorrhea, unspecified: Secondary | ICD-10-CM | POA: Diagnosis not present

## 2020-10-07 DIAGNOSIS — Z825 Family history of asthma and other chronic lower respiratory diseases: Secondary | ICD-10-CM | POA: Diagnosis not present

## 2020-10-07 DIAGNOSIS — I10 Essential (primary) hypertension: Secondary | ICD-10-CM | POA: Diagnosis not present

## 2020-10-07 DIAGNOSIS — Z841 Family history of disorders of kidney and ureter: Secondary | ICD-10-CM | POA: Insufficient documentation

## 2020-10-07 DIAGNOSIS — Z8616 Personal history of COVID-19: Secondary | ICD-10-CM | POA: Insufficient documentation

## 2020-10-07 DIAGNOSIS — Z823 Family history of stroke: Secondary | ICD-10-CM | POA: Insufficient documentation

## 2020-10-07 HISTORY — PX: DILITATION & CURRETTAGE/HYSTROSCOPY WITH NOVASURE ABLATION: SHX5568

## 2020-10-07 HISTORY — DX: Other complications of anesthesia, initial encounter: T88.59XA

## 2020-10-07 HISTORY — DX: Anemia, unspecified: D64.9

## 2020-10-07 HISTORY — DX: Gestational (pregnancy-induced) hypertension without significant proteinuria, unspecified trimester: O13.9

## 2020-10-07 HISTORY — DX: Personal history of gestational diabetes: Z86.32

## 2020-10-07 HISTORY — DX: Unspecified pre-eclampsia, unspecified trimester: O14.90

## 2020-10-07 LAB — TYPE AND SCREEN
ABO/RH(D): AB POS
Antibody Screen: NEGATIVE

## 2020-10-07 LAB — POCT PREGNANCY, URINE: Preg Test, Ur: NEGATIVE

## 2020-10-07 SURGERY — DILATATION & CURETTAGE/HYSTEROSCOPY WITH NOVASURE ABLATION
Anesthesia: General | Site: Vagina

## 2020-10-07 MED ORDER — ACETAMINOPHEN 500 MG PO TABS
ORAL_TABLET | ORAL | Status: AC
  Filled 2020-10-07: qty 2

## 2020-10-07 MED ORDER — FENTANYL CITRATE (PF) 100 MCG/2ML IJ SOLN
INTRAMUSCULAR | Status: AC
  Filled 2020-10-07: qty 2

## 2020-10-07 MED ORDER — FENTANYL CITRATE (PF) 100 MCG/2ML IJ SOLN
INTRAMUSCULAR | Status: DC | PRN
  Administered 2020-10-07: 25 ug via INTRAVENOUS
  Administered 2020-10-07: 50 ug via INTRAVENOUS
  Administered 2020-10-07: 25 ug via INTRAVENOUS

## 2020-10-07 MED ORDER — OXYCODONE HCL 5 MG PO TABS: 5.0000 mg | ORAL_TABLET | Freq: Once | ORAL | Status: DC | PRN

## 2020-10-07 MED ORDER — ONDANSETRON HCL 4 MG/2ML IJ SOLN: 4.0000 mg | Freq: Once | INTRAMUSCULAR | Status: DC | PRN

## 2020-10-07 MED ORDER — SILVER NITRATE-POT NITRATE 75-25 % EX MISC
CUTANEOUS | Status: DC | PRN
  Administered 2020-10-07: 2

## 2020-10-07 MED ORDER — KETOROLAC TROMETHAMINE 30 MG/ML IJ SOLN: 30.0000 mg | Freq: Once | INTRAMUSCULAR | Status: DC

## 2020-10-07 MED ORDER — OXYCODONE HCL 5 MG/5ML PO SOLN: 5.0000 mg | Freq: Once | ORAL | Status: DC | PRN

## 2020-10-07 MED ORDER — ONDANSETRON HCL 4 MG/2ML IJ SOLN
INTRAMUSCULAR | Status: AC
  Filled 2020-10-07: qty 2

## 2020-10-07 MED ORDER — GLYCOPYRROLATE 0.2 MG/ML IJ SOLN
INTRAMUSCULAR | Status: DC | PRN
  Administered 2020-10-07: .1 mg via INTRAVENOUS

## 2020-10-07 MED ORDER — MIDAZOLAM HCL 5 MG/5ML IJ SOLN
INTRAMUSCULAR | Status: DC | PRN
  Administered 2020-10-07 (×2): 1 mg via INTRAVENOUS

## 2020-10-07 MED ORDER — DEXAMETHASONE SODIUM PHOSPHATE 10 MG/ML IJ SOLN
INTRAMUSCULAR | Status: AC
  Filled 2020-10-07: qty 1

## 2020-10-07 MED ORDER — IBUPROFEN 600 MG PO TABS: 600.0000 mg | ORAL_TABLET | Freq: Four times a day (QID) | ORAL | 0 refills | Status: DC | PRN

## 2020-10-07 MED ORDER — LACTATED RINGERS IV SOLN: INTRAVENOUS | Status: DC

## 2020-10-07 MED ORDER — ACETAMINOPHEN 500 MG PO TABS
1000.0000 mg | ORAL_TABLET | ORAL | Status: AC
  Administered 2020-10-07: 1000 mg via ORAL

## 2020-10-07 MED ORDER — MIDAZOLAM HCL 2 MG/2ML IJ SOLN
INTRAMUSCULAR | Status: AC
  Filled 2020-10-07: qty 2

## 2020-10-07 MED ORDER — OXYCODONE-ACETAMINOPHEN 5-325 MG PO TABS: 1.0000 | ORAL_TABLET | ORAL | 0 refills | Status: DC | PRN

## 2020-10-07 MED ORDER — LIDOCAINE HCL (PF) 2 % IJ SOLN
INTRAMUSCULAR | Status: AC
  Filled 2020-10-07: qty 5

## 2020-10-07 MED ORDER — SODIUM CHLORIDE 0.9 % IR SOLN
Status: DC | PRN
  Administered 2020-10-07: 300 mL

## 2020-10-07 MED ORDER — AMISULPRIDE (ANTIEMETIC) 5 MG/2ML IV SOLN
10.0000 mg | Freq: Once | INTRAVENOUS | Status: AC | PRN
  Administered 2020-10-07: 10 mg via INTRAVENOUS

## 2020-10-07 MED ORDER — FENTANYL CITRATE (PF) 100 MCG/2ML IJ SOLN: 25.0000 ug | INTRAMUSCULAR | Status: DC | PRN

## 2020-10-07 MED ORDER — LIDOCAINE HCL 1 % IJ SOLN
INTRAMUSCULAR | Status: DC | PRN
  Administered 2020-10-07: 50 mg via INTRADERMAL

## 2020-10-07 MED ORDER — PROPOFOL 10 MG/ML IV BOLUS
INTRAVENOUS | Status: AC
  Filled 2020-10-07: qty 20

## 2020-10-07 MED ORDER — AMISULPRIDE (ANTIEMETIC) 5 MG/2ML IV SOLN
INTRAVENOUS | Status: AC
  Filled 2020-10-07: qty 4

## 2020-10-07 MED ORDER — PROPOFOL 10 MG/ML IV BOLUS
INTRAVENOUS | Status: DC | PRN
  Administered 2020-10-07: 170 mg via INTRAVENOUS

## 2020-10-07 MED ORDER — POVIDONE-IODINE 10 % EX SWAB: 2.0000 "application " | Freq: Once | CUTANEOUS | Status: DC

## 2020-10-07 MED FILL — OXYCODONE-APAP 5-325MG: 5-325 | 3 days supply | Qty: 15 | Fill #0

## 2020-10-07 MED FILL — IBUPROFEN 600 MG TABLET: 600 | 8 days supply | Qty: 30 | Fill #0

## 2020-10-07 SURGICAL SUPPLY — 15 items
ABLATOR SURESOUND NOVASURE (ABLATOR) ×1 IMPLANT
CATH SILICONE 16FRX5CC (CATHETERS) ×1 IMPLANT
COVER WAND RF STERILE (DRAPES) ×1 IMPLANT
GAUZE 4X4 16PLY RFD (DISPOSABLE) ×2 IMPLANT
GLOVE BIO SURGEON STRL SZ 6.5 (GLOVE) ×2 IMPLANT
GOWN STRL REUS W/TWL LRG LVL3 (GOWN DISPOSABLE) ×2 IMPLANT
IV NS IRRIG 3000ML ARTHROMATIC (IV SOLUTION) ×2 IMPLANT
KIT PROCEDURE FLUENT (KITS) ×2 IMPLANT
KIT TURNOVER CYSTO (KITS) ×2 IMPLANT
NDL SPNL 18GX3.5 QUINCKE PK (NEEDLE) IMPLANT
NEEDLE SPNL 18GX3.5 QUINCKE PK (NEEDLE) IMPLANT
PACK VAGINAL MINOR WOMEN LF (CUSTOM PROCEDURE TRAY) ×2 IMPLANT
PAD OB MATERNITY 4.3X12.25 (PERSONAL CARE ITEMS) ×2 IMPLANT
SEAL CERVICAL OMNI LOK (ABLATOR) IMPLANT
TOWEL OR 17X26 10 PK STRL BLUE (TOWEL DISPOSABLE) ×2 IMPLANT

## 2020-10-07 NOTE — Anesthesia Preprocedure Evaluation (Addendum)
Anesthesia Evaluation  Patient identified by MRN, date of birth, ID band Patient awake    Reviewed: Allergy & Precautions, NPO status , Patient's Chart, lab work & pertinent test results  History of Anesthesia Complications Negative for: history of anesthetic complications  Airway Mallampati: II  TM Distance: >3 FB Neck ROM: Full    Dental  (+) Teeth Intact   Pulmonary asthma ,    Pulmonary exam normal        Cardiovascular hypertension, negative cardio ROS Normal cardiovascular exam     Neuro/Psych negative neurological ROS  negative psych ROS   GI/Hepatic negative GI ROS, Neg liver ROS,   Endo/Other  Morbid obesity  Renal/GU negative Renal ROS  negative genitourinary   Musculoskeletal negative musculoskeletal ROS (+)   Abdominal   Peds  Hematology  (+) anemia ,   Anesthesia Other Findings   Reproductive/Obstetrics fibroids                            Anesthesia Physical Anesthesia Plan  ASA: III  Anesthesia Plan: General   Post-op Pain Management:    Induction: Intravenous  PONV Risk Score and Plan: 4 or greater and Ondansetron, Dexamethasone, Midazolam and Treatment may vary due to age or medical condition  Airway Management Planned: LMA  Additional Equipment: None  Intra-op Plan:   Post-operative Plan: Extubation in OR  Informed Consent: I have reviewed the patients History and Physical, chart, labs and discussed the procedure including the risks, benefits and alternatives for the proposed anesthesia with the patient or authorized representative who has indicated his/her understanding and acceptance.     Dental advisory given  Plan Discussed with:   Anesthesia Plan Comments:        Anesthesia Quick Evaluation

## 2020-10-07 NOTE — Transfer of Care (Signed)
Immediate Anesthesia Transfer of Care Note  Patient: Stacey Carlson  Procedure(s) Performed: DILATATION & CURETTAGE/HYSTEROSCOPY WITH NOVASURE ABLATION, CERVICAL POLPYECTOMY (N/A Vagina )  Patient Location: PACU  Anesthesia Type:General  Level of Consciousness: awake, alert , oriented and patient cooperative  Airway & Oxygen Therapy: Patient Spontanous Breathing and Patient connected to face mask oxygen  Post-op Assessment: Report given to RN and Post -op Vital signs reviewed and stable  Post vital signs: Reviewed and stable  Last Vitals:  Vitals Value Taken Time  BP 135/72 10/07/20 1000  Temp 36.6 C 10/07/20 0937  Pulse 57 10/07/20 1007  Resp 15 10/07/20 1007  SpO2 98 % 10/07/20 1007  Vitals shown include unvalidated device data.  Last Pain:  Vitals:   10/07/20 0937  TempSrc:   PainSc: 0-No pain      Patients Stated Pain Goal: 5 (28/36/62 9476)  Complications: No complications documented.

## 2020-10-07 NOTE — Discharge Instructions (Signed)
Call office with any concerns [phone]   Post Anesthesia Home Care Instructions  Activity: Get plenty of rest for the remainder of the day. A responsible individual must stay with you for 24 hours following the procedure.  For the next 24 hours, DO NOT: -Drive a car -Paediatric nurse -Drink alcoholic beverages -Take any medication unless instructed by your physician -Make any legal decisions or sign important papers.  Meals: Start with liquid foods such as gelatin or soup. Progress to regular foods as tolerated. Avoid greasy, spicy, heavy foods. If nausea and/or vomiting occur, drink only clear liquids until the nausea and/or vomiting subsides. Call your physician if vomiting continues.  Special Instructions/Symptoms: Your throat may feel dry or sore from the anesthesia or the breathing tube placed in your throat during surgery. If this causes discomfort, gargle with warm salt water. The discomfort should disappear within 24 hours.   DISCHARGE INSTRUCTIONS: HYSTEROSCOPY / ENDOMETRIAL ABLATION The following instructions have been prepared to help you care for yourself upon your return home.  May take Tylenol after 1 PM today.  May take stool softner while taking narcotic pain medication to prevent constipation.  Drink plenty of water.  Personal hygiene: [name] Use sanitary pads for vaginal drainage, not tampons. . Shower the day after your procedure. . NO tub baths, pools or Jacuzzis for 2-3 weeks. . Wipe front to back after using the bathroom.  Activity and limitations: . Do NOT drive or operate any equipment for 24 hours. The effects of anesthesia are still present and drowsiness may result. . Do NOT rest in bed all day. . Walking is encouraged. . Walk up and down stairs slowly. . You may resume your normal activity in one to two days or as indicated by your physician. Sexual activity: NO intercourse for at least 2 weeks after the procedure, or as indicated by  your Doctor.  Diet: Eat a light meal as desired this evening. You may resume your usual diet tomorrow.  Return to Work: You may resume your work activities in one to two days or as indicated by Marine scientist.  What to expect after your surgery: Expect to have vaginal bleeding/discharge for 2-3 days and spotting for up to 10 days. It is not unusual to have soreness for up to 1-2 weeks. You may have a slight burning sensation when you urinate for the first day. Mild cramps may continue for a couple of days. You may have a regular period in 2-6 weeks.  Call your doctor for any of the following: . Excessive vaginal bleeding or clotting, saturating and changing one pad every hour. . Inability to urinate 6 hours after discharge from hospital. . Pain not relieved by pain medication. . Fever of 100.4 F or greater. . Unusual vaginal discharge or odor.

## 2020-10-07 NOTE — Interval H&P Note (Signed)
History and Physical Interval Note: No change to H/P Reiterated consent for procedure To OR when ready  10/07/2020 8:38 AM  Stacey Carlson  has presented today for surgery, with the diagnosis of pain on pelvis, uterine leiomyoma.  The various methods of treatment have been discussed with the patient and family. After consideration of risks, benefits and other options for treatment, the patient has consented to  Procedure(s): [place] (N/A) as a surgical intervention.  The patient's history has been reviewed, patient examined, no change in status, stable for surgery.  I have reviewed the patient's chart and labs.  Questions were answered to the patient's satisfaction.     Isaiah Serge

## 2020-10-07 NOTE — Anesthesia Procedure Notes (Signed)
Procedure Name: LMA Insertion Date/Time: 10/07/2020 8:53 AM Performed by: Garrel Ridgel, CRNA Pre-anesthesia Checklist: Patient identified, Emergency Drugs available, Suction available and Patient being monitored Patient Re-evaluated:Patient Re-evaluated prior to induction Oxygen Delivery Method: Circle system utilized Preoxygenation: Pre-oxygenation with 100% oxygen Induction Type: IV induction Ventilation: Mask ventilation without difficulty LMA: LMA inserted LMA Size: 4.0 Number of attempts: 1 Placement Confirmation: positive ETCO2 Tube secured with: Tape Dental Injury: Teeth and Oropharynx as per pre-operative assessment

## 2020-10-07 NOTE — Anesthesia Postprocedure Evaluation (Signed)
Anesthesia Post Note  Patient: Stacey Carlson  Procedure(s) Performed: DILATATION & CURETTAGE/HYSTEROSCOPY WITH NOVASURE ABLATION, CERVICAL POLPYECTOMY (N/A Vagina )     Patient location during evaluation: PACU Anesthesia Type: General Level of consciousness: awake and alert Pain management: pain level controlled Vital Signs Assessment: post-procedure vital signs reviewed and stable Respiratory status: spontaneous breathing, nonlabored ventilation and respiratory function stable Cardiovascular status: blood pressure returned to baseline and stable Postop Assessment: no apparent nausea or vomiting Anesthetic complications: no   No complications documented.  Last Vitals:  Vitals:   10/07/20 1000 10/07/20 1015  BP: 135/72 135/69  Pulse: 62 63  Resp: 15 18  Temp:  36.5 C  SpO2: 98% 98%    Last Pain:  Vitals:   10/07/20 1030  TempSrc:   PainSc: 1                  Lidia Collum

## 2020-10-07 NOTE — Op Note (Signed)
Operative Note    Preoperative Diagnosis 1. Irregular menses 2. Dysmenorrhea    Postoperative Diagnosis Same 3. Cervical polyp  Procedure: Hysteroscopy dilation and curettage with novasure ablation and cervical polypectomy   Surgeon: Mickle Mallory DO   Anesthesia: general   Fluids: LR 629ml EBL: <3ml UOP:136ml   Findings: intracervical polyp, intrauterine adhesion - thin - at fundus; grossly normal position of ostia   Specimen: cervical polyp and endometrial curettings - to pathology   Procedure Note Patient was taken to the operating room an placed in the dorsal lithotomy position. Adequate anesthesia was administered without difficulty. She was then prepped and draped in the normal sterile fashion. An appropriate time out was performed. A speculum was then placed in the vagina and the cervix visualized. The anterior lip of the cervix was grasped with an allis. The uterus was then sounded to 8.5 cm.  A small polyp was noted protruding from the cervical os. Using tissue forceps, this was easily grasped and removed. Next, the [facility] dilators were used to dilate the cervix. The 41mm hysterosscope was introduced into the cavity. At the uterine fundus, a 1cm wide adhesion was noted to run from the anterior to the posterior aspect of the endometrium. It did not run the entire length of the fundus. On further probing it was easily separated. - no bleeding noted. Thus not a septum. The ostia were noted in their normal positions at this time.  Next the uterine cavity was measured and noted to be 5cm long. A sharp curettage was performed.  The Novasure device was then  inserted per manufacturer specifications. A cavity width of 4.1cm was noted.  The device was activated with a treatment time of 68 secs and a power of 113W.  Upon completion, all instruments were removed from the vagina. The tenaculum site was made hemostatic with silver nitrate sticks.   Finally the patient was awakened and taken  to the recovery room in stable condition.

## 2020-10-08 LAB — SURGICAL PATHOLOGY

## 2020-10-09 ENCOUNTER — Encounter (HOSPITAL_BASED_OUTPATIENT_CLINIC_OR_DEPARTMENT_OTHER): Payer: Self-pay | Admitting: Obstetrics and Gynecology

## 2021-05-27 ENCOUNTER — Telehealth: Payer: No Typology Code available for payment source | Admitting: Physician Assistant

## 2021-05-27 NOTE — Progress Notes (Signed)
Pt attempted to create a video visit for her daughter who is under 47 years old. Pt advised that the visit is unable to be completed.

## 2021-07-27 ENCOUNTER — Other Ambulatory Visit (HOSPITAL_COMMUNITY): Payer: Self-pay

## 2021-07-27 MED ORDER — CARESTART COVID-19 HOME TEST VI KIT
PACK | 0 refills | Status: DC
  Filled 2021-07-27: qty 4, 4d supply, fill #0

## 2021-10-29 ENCOUNTER — Other Ambulatory Visit (HOSPITAL_COMMUNITY): Payer: Self-pay

## 2021-10-29 MED ORDER — CARESTART COVID-19 HOME TEST VI KIT
PACK | 1 refills | Status: DC
  Filled 2021-10-29: qty 4, 4d supply, fill #0

## 2022-01-13 ENCOUNTER — Other Ambulatory Visit (HOSPITAL_COMMUNITY): Payer: Self-pay

## 2022-01-13 MED ORDER — PROAIR DIGIHALER 108 (90 BASE) MCG/ACT IN AEPB
INHALATION_SPRAY | Freq: Three times a day (TID) | RESPIRATORY_TRACT | 4 refills | Status: DC | PRN
  Filled 2022-01-13: qty 1, 30d supply, fill #0

## 2022-01-13 MED ORDER — SERTRALINE HCL 50 MG PO TABS
50.0000 mg | ORAL_TABLET | Freq: Every day | ORAL | 3 refills | Status: DC
  Filled 2022-01-13: qty 30, 30d supply, fill #0
  Filled 2022-02-16: qty 30, 30d supply, fill #1
  Filled 2022-04-05: qty 30, 30d supply, fill #2
  Filled 2022-05-05: qty 30, 30d supply, fill #3

## 2022-01-13 MED ORDER — CARESTART COVID-19 HOME TEST VI KIT
PACK | 0 refills | Status: DC
  Filled 2022-01-13: qty 4, 4d supply, fill #0

## 2022-01-14 ENCOUNTER — Other Ambulatory Visit (HOSPITAL_COMMUNITY): Payer: Self-pay

## 2022-01-18 ENCOUNTER — Other Ambulatory Visit: Payer: Self-pay | Admitting: Obstetrics and Gynecology

## 2022-01-18 DIAGNOSIS — R928 Other abnormal and inconclusive findings on diagnostic imaging of breast: Secondary | ICD-10-CM

## 2022-02-02 ENCOUNTER — Ambulatory Visit: Payer: No Typology Code available for payment source

## 2022-02-02 ENCOUNTER — Ambulatory Visit
Admission: RE | Admit: 2022-02-02 | Discharge: 2022-02-02 | Disposition: A | Discharge status: Home / Self Care | Payer: No Typology Code available for payment source | Source: Ambulatory Visit | Attending: Obstetrics and Gynecology | Admitting: Obstetrics and Gynecology

## 2022-02-02 DIAGNOSIS — R928 Other abnormal and inconclusive findings on diagnostic imaging of breast: Secondary | ICD-10-CM

## 2022-02-16 ENCOUNTER — Other Ambulatory Visit (HOSPITAL_COMMUNITY): Payer: Self-pay

## 2022-03-29 ENCOUNTER — Encounter (INDEPENDENT_AMBULATORY_CARE_PROVIDER_SITE_OTHER): Payer: Self-pay

## 2022-03-29 DIAGNOSIS — Z0289 Encounter for other administrative examinations: Secondary | ICD-10-CM

## 2022-04-05 ENCOUNTER — Other Ambulatory Visit (HOSPITAL_COMMUNITY): Payer: Self-pay

## 2022-04-05 ENCOUNTER — Encounter: Payer: Self-pay | Admitting: *Deleted

## 2022-05-05 ENCOUNTER — Other Ambulatory Visit (HOSPITAL_COMMUNITY): Payer: Self-pay

## 2022-05-06 ENCOUNTER — Other Ambulatory Visit (HOSPITAL_COMMUNITY): Payer: Self-pay

## 2022-05-12 ENCOUNTER — Ambulatory Visit (INDEPENDENT_AMBULATORY_CARE_PROVIDER_SITE_OTHER): Payer: No Typology Code available for payment source | Admitting: Family Medicine

## 2022-05-12 ENCOUNTER — Encounter (INDEPENDENT_AMBULATORY_CARE_PROVIDER_SITE_OTHER): Payer: Self-pay | Admitting: Family Medicine

## 2022-05-12 VITALS — BP 132/76 | HR 67 | Temp 98.3°F | Ht 64.0 in | Wt 251.0 lb

## 2022-05-12 DIAGNOSIS — Z6841 Body Mass Index (BMI) 40.0 and over, adult: Secondary | ICD-10-CM | POA: Diagnosis not present

## 2022-05-12 DIAGNOSIS — Z8632 Personal history of gestational diabetes: Secondary | ICD-10-CM

## 2022-05-12 DIAGNOSIS — Z1331 Encounter for screening for depression: Secondary | ICD-10-CM | POA: Diagnosis not present

## 2022-05-12 DIAGNOSIS — R5383 Other fatigue: Secondary | ICD-10-CM

## 2022-05-12 DIAGNOSIS — R0602 Shortness of breath: Secondary | ICD-10-CM

## 2022-05-13 LAB — CBC WITH DIFFERENTIAL/PLATELET
Basophils Absolute: 0 10*3/uL (ref 0.0–0.2)
Basos: 1 %
EOS (ABSOLUTE): 0.1 10*3/uL (ref 0.0–0.4)
Eos: 1 %
Hematocrit: 38.5 % (ref 34.0–46.6)
Hemoglobin: 12.8 g/dL (ref 11.1–15.9)
Immature Grans (Abs): 0.1 10*3/uL (ref 0.0–0.1)
Immature Granulocytes: 1 %
Lymphocytes Absolute: 1.5 10*3/uL (ref 0.7–3.1)
Lymphs: 28 %
MCH: 30 pg (ref 26.6–33.0)
MCHC: 33.2 g/dL (ref 31.5–35.7)
MCV: 90 fL (ref 79–97)
Monocytes Absolute: 0.5 10*3/uL (ref 0.1–0.9)
Monocytes: 10 %
Neutrophils Absolute: 3.2 10*3/uL (ref 1.4–7.0)
Neutrophils: 59 %
Platelets: 284 10*3/uL (ref 150–450)
RBC: 4.26 x10E6/uL (ref 3.77–5.28)
RDW: 12.4 % (ref 11.7–15.4)
WBC: 5.3 10*3/uL (ref 3.4–10.8)

## 2022-05-13 LAB — T3: T3, Total: 125 ng/dL (ref 71–180)

## 2022-05-13 LAB — VITAMIN B12: Vitamin B-12: 465 pg/mL (ref 232–1245)

## 2022-05-13 LAB — VITAMIN D 25 HYDROXY (VIT D DEFICIENCY, FRACTURES): Vit D, 25-Hydroxy: 6.9 ng/mL — ABNORMAL LOW (ref 30.0–100.0)

## 2022-05-13 LAB — LIPID PANEL WITH LDL/HDL RATIO
Cholesterol, Total: 198 mg/dL (ref 100–199)
HDL: 71 mg/dL (ref >39)
LDL Chol Calc (NIH): 117 mg/dL — ABNORMAL HIGH (ref 0–99)
LDL/HDL Ratio: 1.6 ratio (ref 0.0–3.2)
Triglycerides: 56 mg/dL (ref 0–149)
VLDL Cholesterol Cal: 10 mg/dL (ref 5–40)

## 2022-05-13 LAB — HEMOGLOBIN A1C
Est. average glucose Bld gHb Est-mCnc: 126 mg/dL
Hgb A1c MFr Bld: 6 % — ABNORMAL HIGH (ref 4.8–5.6)

## 2022-05-13 LAB — FOLATE: Folate: 4.1 ng/mL (ref >3.0)

## 2022-05-13 LAB — INSULIN, RANDOM: INSULIN: 10.8 u[IU]/mL (ref 2.6–24.9)

## 2022-05-13 LAB — T4, FREE: Free T4: 1.06 ng/dL (ref 0.82–1.77)

## 2022-05-13 LAB — TSH: TSH: 0.914 u[IU]/mL (ref 0.450–4.500)

## 2022-05-24 NOTE — Progress Notes (Signed)
Chief Complaint:   OBESITY Stacey Carlson (MR# [id number]) is a 46 y.o. female who presents for evaluation and treatment of obesity and related comorbidities. Current BMI is Body mass index is 43.08 kg/m. Stacey Carlson has been struggling with her weight for many years and has been unsuccessful in either losing weight, maintaining weight loss, or reaching her healthy weight goal.  Stacey Carlson heard about our clinic through the [facility].  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  Eats out majority of the time; variety of places.  Breakfast, biscuit will blueberry muffin with or without a bottle water or soda or scrambled egg with slice cheese, chicken sausage and 3 pieces of bacon with or without croissant or grits (feels full most of the day).  Lunch, 6 chicken wings, 2 packages of [employer] brownies.  2 cups of spaghetti, 1-1/2 cup of salad for supper.  Stacey Carlson is currently in the action stage of change and ready to dedicate time achieving and maintaining a healthier weight. Stacey Carlson is interested in becoming our patient and working on intensive lifestyle modifications including (but not limited to) diet and exercise for weight loss.  Stacey Carlson's habits were reviewed today and are as follows: Her family eats meals together, she thinks her family will eat healthier with her, her desired weight loss is 91 lbs, she started gaining weight when trying to get pregnant, fertility medication, her heaviest weight ever was 190 pounds, she is a picky eater and doesn't like to eat healthier foods, she snacks frequently in the evenings, she skips meals frequently, and she is frequently drinking liquids with calories.  Depression Screen Stacey Carlson's Food and Mood (modified PHQ-9) score was 2.     05/12/2022    8:23 AM  Depression screen PHQ 2/9  Decreased Interest 0  Down, Depressed, Hopeless 1  PHQ - 2 Score 1  Altered sleeping 0  Tired, decreased energy 0  Change in appetite 1  Feeling  bad or failure about yourself  0  Trouble concentrating 0  Moving slowly or fidgety/restless 0  Suicidal thoughts 0  PHQ-9 Score 2  Difficult doing work/chores Not difficult at all   Subjective:   1. Other fatigue Stacey Carlson admits to daytime somnolence and admits to waking up still tired. Patient has a history of symptoms of daytime fatigue and morning fatigue. Stacey Carlson generally gets 5 or 6 hours of sleep per night, and states that she has nightime awakenings. Snoring is present. Apneic episodes are not present. Epworth Sleepiness Score is 11.  EKG normal sinus rhythm at 63 bpm.  2. SOBOE (shortness of breath on exertion) Stacey Carlson notes increasing shortness of breath with exercising and seems to be worsening over time with weight gain. She notes getting out of breath sooner with activity than she used to. This has not gotten worse recently. Stacey Carlson denies shortness of breath at rest or orthopnea.  3. H/O gestational diabetes mellitus, not currently pregnant Stacey Carlson has a history of gestational diabetes and pregnancy in 2021.  No follow-up on glucose test in Epic.  Assessment/Plan:   1. Other fatigue Stacey Carlson does feel that her weight is causing her energy to be lower than it should be. Fatigue may be related to obesity, depression or many other causes. Labs will be ordered, and in the meanwhile, Stacey Carlson will focus on self care including making healthy food choices, increasing physical activity and focusing on stress reduction.  - EKG 12-Lead - Vitamin B12 - CBC with Differential/Platelet -  Folate - Lipid Panel With LDL/HDL Ratio - T3 - T4, free - TSH - VITAMIN D 25 Hydroxy (Vit-D Deficiency, Fractures)  2. SOBOE (shortness of breath on exertion) Stacey Carlson does feel that she gets out of breath more easily that she used to when she exercises. Stacey Carlson's shortness of breath appears to be obesity related and exercise induced. She has agreed to work on weight loss and gradually increase exercise to  treat her exercise induced shortness of breath. Will continue to monitor closely.  3. H/O gestational diabetes mellitus, not currently pregnant We will check labs today, and we will follow-up at Stacey Carlson's next appointment.  - Hemoglobin A1c - Insulin, random  4. Depression screening Stacey Carlson had a negative depression screening. Depression is commonly associated with obesity and often results in emotional eating behaviors. We will monitor this closely and work on CBT to help improve the non-hunger eating patterns. Referral to Psychology may be required if no improvement is seen as she continues in our clinic.  5. Class 3 severe obesity with serious comorbidity and body mass index (BMI) of 40.0 to 44.9 in adult, unspecified obesity type (HCC) Stacey Carlson is currently in the action stage of change and her goal is to continue with weight loss efforts. I recommend Stacey Carlson begin the structured treatment plan as follows:  She has agreed to the Category 3 Plan.  Exercise goals: No exercise has been prescribed at this time.   Behavioral modification strategies: increasing lean protein intake, no skipping meals, meal planning and cooking strategies, and keeping healthy foods in the home.  She was informed of the importance of frequent follow-up visits to maximize her success with intensive lifestyle modifications for her multiple health conditions. She was informed we would discuss her lab results at her next visit unless there is a critical issue that needs to be addressed sooner. Stacey Carlson agreed to keep her next visit at the agreed upon time to discuss these results.  Objective:   Blood pressure 132/76, pulse 67, temperature 98.3 F (36.8 C), height '5\' 4"'$  (1.626 m), weight 251 lb (113.9 kg), last menstrual period 04/25/2022, SpO2 99 %. Body mass index is 43.08 kg/m.  EKG: Normal sinus rhythm, rate 63 BPM.  Indirect Calorimeter completed today shows a VO2 of 282 and a REE of 1944.  Her calculated basal  metabolic rate is 9563 thus her basal metabolic rate is better than expected.  General: Cooperative, alert, well developed, in no acute distress. HEENT: Conjunctivae and lids unremarkable. Cardiovascular: Regular rhythm.  Lungs: Normal work of breathing. Neurologic: No focal deficits.   Lab Results  Component Value Date   CREATININE 0.67 11/15/2019   BUN 8 10/19/2019   NA 135 10/19/2019   K 3.6 10/19/2019   CL 105 10/19/2019   CO2 20 (L) 10/19/2019   Lab Results  Component Value Date   ALT 24 10/19/2019   AST 25 10/19/2019   ALKPHOS 134 (H) 10/19/2019   BILITOT 0.5 10/19/2019   Lab Results  Component Value Date   HGBA1C 6.0 (H) 05/12/2022   Lab Results  Component Value Date   INSULIN 10.8 05/12/2022   Lab Results  Component Value Date   TSH 0.914 05/12/2022   Lab Results  Component Value Date   CHOL 198 05/12/2022   HDL 71 05/12/2022   LDLCALC 117 (H) 05/12/2022   TRIG 56 05/12/2022   CHOLHDL 2.7 02/16/2012   Lab Results  Component Value Date   WBC 5.3 05/12/2022   HGB 12.8 05/12/2022  HCT 38.5 05/12/2022   MCV 90 05/12/2022   PLT 284 05/12/2022   No results found for: "IRON", "TIBC", "FERRITIN"  Attestation Statements:   Reviewed by clinician on day of visit: allergies, medications, problem list, medical history, surgical history, family history, social history, and previous encounter notes.   I, Trixie Dredge, am acting as transcriptionist for Coralie Common, MD.  This is the patient's first visit at [facility]. The patient's NEW PATIENT PACKET was reviewed at length. Included in the packet: current and past health history, medications, allergies, ROS, gynecologic history (women only), surgical history, family history, social history, weight history, weight loss surgery history (for those that have had weight loss surgery), nutritional evaluation, mood and food questionnaire, PHQ9, Epworth questionnaire, sleep habits questionnaire,  patient life and health improvement goals questionnaire. These will all be scanned into the patient's chart under media.   During the visit, I independently reviewed the patient's EKG, bioimpedance scale results, and indirect calorimeter results. I used this information to tailor a meal plan for the patient that will help her to lose weight and will improve her obesity-related conditions going forward. I performed a medically necessary appropriate examination and/or evaluation. I discussed the assessment and treatment plan with the patient. The patient was provided an opportunity to ask questions and all were answered. The patient agreed with the plan and demonstrated an understanding of the instructions. Labs were ordered at this visit and will be reviewed at the next visit unless more critical results need to be addressed immediately. Clinical information was updated and documented in the EMR.   Time spent on visit including pre-visit chart review and post-visit care was 45 minutes.    I have reviewed the above documentation for accuracy and completeness, and I agree with the above. - Coralie Common, MD

## 2022-05-26 ENCOUNTER — Ambulatory Visit (INDEPENDENT_AMBULATORY_CARE_PROVIDER_SITE_OTHER): Payer: Self-pay | Admitting: Family Medicine

## 2022-06-01 ENCOUNTER — Other Ambulatory Visit (HOSPITAL_COMMUNITY): Payer: Self-pay

## 2022-06-01 ENCOUNTER — Ambulatory Visit (INDEPENDENT_AMBULATORY_CARE_PROVIDER_SITE_OTHER): Payer: No Typology Code available for payment source | Admitting: Family Medicine

## 2022-06-01 ENCOUNTER — Encounter (INDEPENDENT_AMBULATORY_CARE_PROVIDER_SITE_OTHER): Payer: Self-pay | Admitting: Family Medicine

## 2022-06-01 VITALS — BP 132/77 | HR 82 | Temp 98.3°F | Ht 64.0 in | Wt 247.0 lb

## 2022-06-01 DIAGNOSIS — E7849 Other hyperlipidemia: Secondary | ICD-10-CM

## 2022-06-01 DIAGNOSIS — E669 Obesity, unspecified: Secondary | ICD-10-CM | POA: Diagnosis not present

## 2022-06-01 DIAGNOSIS — R7303 Prediabetes: Secondary | ICD-10-CM

## 2022-06-01 DIAGNOSIS — E559 Vitamin D deficiency, unspecified: Secondary | ICD-10-CM

## 2022-06-01 DIAGNOSIS — Z6841 Body Mass Index (BMI) 40.0 and over, adult: Secondary | ICD-10-CM

## 2022-06-01 MED ORDER — VITAMIN D (ERGOCALCIFEROL) 1.25 MG (50000 UNIT) PO CAPS
50000.0000 [IU] | ORAL_CAPSULE | ORAL | 0 refills | Status: DC
  Filled 2022-06-01: qty 4, 28d supply, fill #0

## 2022-06-06 NOTE — Progress Notes (Signed)
Chief Complaint:   OBESITY Stacey Carlson is here to discuss her progress with her obesity treatment plan along with follow-up of her obesity related diagnoses. Stacey Carlson is on the Category 3 Plan and states she is following her eating plan approximately 60% of the time. Stacey Carlson states she is walking at work 14,000 steps  5 times per week.  Today's visit was #: 2 Starting weight: 251 lbs Starting date: 05/12/2022 Today's weight: 247 lbs Today's date: 06/01/2022 Total lbs lost to date: 0 lbs Total lbs lost since last in-office visit: 0  Interim History: Stacey Carlson voices she did not follow meal plan directly--ate some food off plan but was conscious of quantity. She liked the options she had on plan. Felt the flexibility of plan made it feel more realistic. Stacey Carlson may have trip with husband scheduled and a beach trip with her daughter. Recognizes she eats out more than eating in.   Subjective:   1. Vitamin D deficiency Labs discussed during visit today. Stacey Carlson's Vit D level of 6.9. Denies any nausea, vomiting or muscle weakness. She is not on Vit D, notes fatigue.  2. Other hyperlipidemia Labs discussed during visit today. Stacey Carlson is not currently on medication. HDL of 71, Trgly of 56, and LDL of 117.  3. Prediabetes Labs discussed during visit today. Stacey Carlson's A1c was 6.0, insulin was 10.8. She is not meds. History of Gestational diabetes. Pathophysiology discussed at length today of IR, Prediabetes, and Diabetes.   Assessment/Plan:   1. Vitamin D deficiency We will refill Vit D 50,000 IU once a week for 1 month with 0 refills. Recheck labs in 3 months.   -Refill Vitamin D, Ergocalciferol, (DRISDOL) 1.25 MG (50000 UNIT) CAPS capsule; Take 1 capsule (50,000 Units total) by mouth every 7 (seven) days.  Dispense: 4 capsule; Refill: 0  2. Other hyperlipidemia Lifestyle changes---Stacey Carlson changed to journaling plan.  3. Prediabetes Stacey Carlson will follow up with labs in 3-4 months. Medications  discussed--- will consider if hunger increases or cravings increase.   4. Obesity with current BMI of 42.5 Stacey Carlson is currently in the action stage of change. As such, her goal is to continue with weight loss efforts. She has agreed to keeping a food journal and adhering to recommended goals of 1400-1500 calories and 90+ grams of protein daily.   Exercise goals: As is.  Behavioral modification strategies: increasing lean protein intake, meal planning and cooking strategies, keeping healthy foods in the home, and planning for success.  Stacey Carlson has agreed to follow-up with our clinic in 3 weeks. She was informed of the importance of frequent follow-up visits to maximize her success with intensive lifestyle modifications for her multiple health conditions.   Objective:   Blood pressure 132/77, pulse 82, temperature 98.3 F (36.8 C), height '5\' 4"'$  (1.626 m), weight 247 lb (112 kg), last menstrual period 04/25/2022, SpO2 98 %. Body mass index is 42.4 kg/m.  General: Cooperative, alert, well developed, in no acute distress. HEENT: Conjunctivae and lids unremarkable. Cardiovascular: Regular rhythm.  Lungs: Normal work of breathing. Neurologic: No focal deficits.   Lab Results  Component Value Date   CREATININE 0.67 11/15/2019   BUN 8 10/19/2019   NA 135 10/19/2019   K 3.6 10/19/2019   CL 105 10/19/2019   CO2 20 (L) 10/19/2019   Lab Results  Component Value Date   ALT 24 10/19/2019   AST 25 10/19/2019   ALKPHOS 134 (H) 10/19/2019   BILITOT 0.5 10/19/2019   Lab Results  Component  Value Date   HGBA1C 6.0 (H) 05/12/2022   Lab Results  Component Value Date   INSULIN 10.8 05/12/2022   Lab Results  Component Value Date   TSH 0.914 05/12/2022   Lab Results  Component Value Date   CHOL 198 05/12/2022   HDL 71 05/12/2022   LDLCALC 117 (H) 05/12/2022   TRIG 56 05/12/2022   CHOLHDL 2.7 02/16/2012   Lab Results  Component Value Date   VD25OH 6.9 (L) 05/12/2022   Lab Results   Component Value Date   WBC 5.3 05/12/2022   HGB 12.8 05/12/2022   HCT 38.5 05/12/2022   MCV 90 05/12/2022   PLT 284 05/12/2022   No results found for: "IRON", "TIBC", "FERRITIN"  Attestation Statements:   Reviewed by clinician on day of visit: allergies, medications, problem list, medical history, surgical history, family history, social history, and previous encounter notes.  Time spent on visit including pre-visit chart review and post-visit care and charting was 24 minutes.   I, Elnora Morrison, RMA am acting as transcriptionist for Coralie Common, MD.  I have reviewed the above documentation for accuracy and completeness, and I agree with the above. - Coralie Common, MD

## 2022-06-08 ENCOUNTER — Encounter (INDEPENDENT_AMBULATORY_CARE_PROVIDER_SITE_OTHER): Payer: Self-pay

## 2022-06-08 ENCOUNTER — Other Ambulatory Visit (HOSPITAL_COMMUNITY): Payer: Self-pay

## 2022-06-08 MED ORDER — ESCITALOPRAM OXALATE 10 MG PO TABS
10.0000 mg | ORAL_TABLET | Freq: Every day | ORAL | 3 refills | Status: DC
  Filled 2022-06-08: qty 30, 30d supply, fill #0
  Filled 2022-07-15: qty 30, 30d supply, fill #1
  Filled 2022-08-23: qty 30, 30d supply, fill #2
  Filled 2022-09-21: qty 30, 30d supply, fill #3

## 2022-06-09 ENCOUNTER — Other Ambulatory Visit (HOSPITAL_COMMUNITY): Payer: Self-pay

## 2022-06-15 ENCOUNTER — Ambulatory Visit (INDEPENDENT_AMBULATORY_CARE_PROVIDER_SITE_OTHER): Payer: No Typology Code available for payment source | Admitting: Family Medicine

## 2022-06-15 ENCOUNTER — Encounter (INDEPENDENT_AMBULATORY_CARE_PROVIDER_SITE_OTHER): Payer: Self-pay | Admitting: Family Medicine

## 2022-06-15 ENCOUNTER — Other Ambulatory Visit (HOSPITAL_COMMUNITY): Payer: Self-pay

## 2022-06-15 VITALS — BP 126/75 | HR 71 | Temp 98.7°F | Ht 64.0 in | Wt 246.0 lb

## 2022-06-15 DIAGNOSIS — E559 Vitamin D deficiency, unspecified: Secondary | ICD-10-CM

## 2022-06-15 DIAGNOSIS — R7303 Prediabetes: Secondary | ICD-10-CM | POA: Diagnosis not present

## 2022-06-15 DIAGNOSIS — E669 Obesity, unspecified: Secondary | ICD-10-CM

## 2022-06-15 DIAGNOSIS — Z6841 Body Mass Index (BMI) 40.0 and over, adult: Secondary | ICD-10-CM

## 2022-06-15 DIAGNOSIS — E639 Nutritional deficiency, unspecified: Secondary | ICD-10-CM

## 2022-06-15 MED ORDER — METFORMIN HCL 500 MG PO TABS
500.0000 mg | ORAL_TABLET | Freq: Every day | ORAL | 0 refills | Status: DC
  Filled 2022-06-15: qty 30, 30d supply, fill #0

## 2022-06-15 MED ORDER — VITAMIN D (ERGOCALCIFEROL) 1.25 MG (50000 UNIT) PO CAPS
50000.0000 [IU] | ORAL_CAPSULE | ORAL | 0 refills | Status: DC
  Filled 2022-06-15 – 2022-06-29 (×2): qty 4, 28d supply, fill #0

## 2022-06-27 NOTE — Progress Notes (Signed)
Chief Complaint:   OBESITY Stacey Carlson is here to discuss her progress with her obesity treatment plan along with follow-up of her obesity related diagnoses. Stacey Carlson is on keeping a food journal and adhering to recommended goals of 1400-1500 calories and 90+ grams of protein daily and states she is following her eating plan approximately 50% of the time. Stacey Carlson states she is walking at work 8 hours shifts 5 times per week.    Today's visit was #: 3 Starting weight: 251 lbs Starting date: 05/12/2022 Today's weight: 246 lbs Today's date: 06/15/2022 Total lbs lost to date: 5 Total lbs lost since last in-office visit: 1  Interim History: Stacey Carlson is still eating out, cafeteria breakfast Monday through Friday, eggs, cheese, sausage, and bacon.  Sometimes has lunch in the cafeteria, pizza, broccoli and cheese soup, or chicken tenders, or fast food for lunch.  She cut back on snacks.  Rarely eats sweets.  Craves soda.  Did try unsweetened tea.  Drinks regular soda.  Rarely cooks at home.  She is a picky eater.  Has used the My[employer]itness Pal app.  Subjective:   1. Vitamin D deficiency Stacey Carlson is on prescription vitamin D 50,000 units weekly.  Last vitamin D level was very low at 6.9.  Her energy level is improving.  2. Prediabetes Stacey Carlson's A1c was 6.0 on her labs in July 2023.  She is not on any medications for prediabetes.  She has a family history of type 2 diabetes mellitus and a family history of gestational diabetes mellitus.  She continues to consume excess sugar.  3. Poor nutrition Stacey Carlson continues to consume 3 meals per day most days, and eating out with processed meats, fried foods, excess sugar.  Lacks adequate fiber or lean protein.  Assessment/Plan:   1. Vitamin D deficiency We will refill prescription vitamin D 50,000 units weekly for 1 month.  We will plan to recheck vitamin D level in 2 months.  - Vitamin D, Ergocalciferol, (DRISDOL) 1.25 MG (50000 UNIT) CAPS capsule; Take 1 capsule  (50,000 Units total) by mouth every 7 (seven) days.  Dispense: 4 capsule; Refill: 0  2. Prediabetes Stacey Carlson will begin metformin 500 mg once daily with food, with no refills.  - metFORMIN (GLUCOPHAGE) 500 MG tablet; Take 1 tablet (500 mg total) by mouth daily with breakfast.  Dispense: 30 tablet; Refill: 0  3. Poor nutrition Stacey Carlson is to log on MyFitness Pal app.  We discussed easy meal alternatives to eating out.  She is to increase fruits and vegetables.  4. Obesity with current BMI of 42.3 Stacey Carlson is currently in the action stage of change. As such, her goal is to continue with weight loss efforts. She has agreed to keeping a food journal and adhering to recommended goals of 1500-1600 calories and 90 grams of protein daily.   Patient journaling, additional meal options provided.  Exercise goals: Track daily steps.   Behavioral modification strategies: decreasing liquid calories, decreasing sodium intake, decreasing eating out, meal planning and cooking strategies, better snacking choices, planning for success, keeping a strict food journal, and decreasing junk food.  Stacey Carlson has agreed to follow-up with our clinic in 3 weeks. She was informed of the importance of frequent follow-up visits to maximize her success with intensive lifestyle modifications for her multiple health conditions.   Objective:   Blood pressure 126/75, pulse 71, temperature 98.7 F (37.1 C), height '5\' 4"'$  (1.626 m), weight 246 lb (111.6 kg), SpO2 98 %. Body mass index is 42.23  kg/m.  General: Cooperative, alert, well developed, in no acute distress. HEENT: Conjunctivae and lids unremarkable. Cardiovascular: Regular rhythm.  Lungs: Normal work of breathing. Neurologic: No focal deficits.   Lab Results  Component Value Date   CREATININE 0.67 11/15/2019   BUN 8 10/19/2019   NA 135 10/19/2019   K 3.6 10/19/2019   CL 105 10/19/2019   CO2 20 (L) 10/19/2019   Lab Results  Component Value Date   ALT 24  10/19/2019   AST 25 10/19/2019   ALKPHOS 134 (H) 10/19/2019   BILITOT 0.5 10/19/2019   Lab Results  Component Value Date   HGBA1C 6.0 (H) 05/12/2022   Lab Results  Component Value Date   INSULIN 10.8 05/12/2022   Lab Results  Component Value Date   TSH 0.914 05/12/2022   Lab Results  Component Value Date   CHOL 198 05/12/2022   HDL 71 05/12/2022   LDLCALC 117 (H) 05/12/2022   TRIG 56 05/12/2022   CHOLHDL 2.7 02/16/2012   Lab Results  Component Value Date   VD25OH 6.9 (L) 05/12/2022   Lab Results  Component Value Date   WBC 5.3 05/12/2022   HGB 12.8 05/12/2022   HCT 38.5 05/12/2022   MCV 90 05/12/2022   PLT 284 05/12/2022   No results found for: "IRON", "TIBC", "FERRITIN"  Attestation Statements:   Reviewed by clinician on day of visit: allergies, medications, problem list, medical history, surgical history, family history, social history, and previous encounter notes.   Wilhemena Durie, am acting as transcriptionist for Loyal Gambler, DO.  I have reviewed the above documentation for accuracy and completeness, and I agree with the above. Dell Ponto, DO

## 2022-06-30 ENCOUNTER — Other Ambulatory Visit (HOSPITAL_COMMUNITY): Payer: Self-pay

## 2022-07-12 ENCOUNTER — Other Ambulatory Visit (HOSPITAL_COMMUNITY): Payer: Self-pay

## 2022-07-12 ENCOUNTER — Encounter (INDEPENDENT_AMBULATORY_CARE_PROVIDER_SITE_OTHER): Payer: Self-pay | Admitting: Family Medicine

## 2022-07-12 ENCOUNTER — Ambulatory Visit (INDEPENDENT_AMBULATORY_CARE_PROVIDER_SITE_OTHER): Payer: No Typology Code available for payment source | Admitting: Family Medicine

## 2022-07-12 VITALS — BP 150/82 | HR 76 | Temp 98.2°F | Ht 64.0 in | Wt 247.0 lb

## 2022-07-12 DIAGNOSIS — R7303 Prediabetes: Secondary | ICD-10-CM

## 2022-07-12 DIAGNOSIS — E559 Vitamin D deficiency, unspecified: Secondary | ICD-10-CM | POA: Diagnosis not present

## 2022-07-12 DIAGNOSIS — E66813 Obesity, class 3: Secondary | ICD-10-CM

## 2022-07-12 DIAGNOSIS — F329 Major depressive disorder, single episode, unspecified: Secondary | ICD-10-CM

## 2022-07-12 DIAGNOSIS — Z6841 Body Mass Index (BMI) 40.0 and over, adult: Secondary | ICD-10-CM

## 2022-07-12 DIAGNOSIS — E669 Obesity, unspecified: Secondary | ICD-10-CM

## 2022-07-12 DIAGNOSIS — R030 Elevated blood-pressure reading, without diagnosis of hypertension: Secondary | ICD-10-CM | POA: Diagnosis not present

## 2022-07-12 MED ORDER — METFORMIN HCL 500 MG PO TABS
500.0000 mg | ORAL_TABLET | Freq: Every day | ORAL | 0 refills | Status: DC
  Filled 2022-07-12: qty 30, 30d supply, fill #0

## 2022-07-12 MED ORDER — VITAMIN D (ERGOCALCIFEROL) 1.25 MG (50000 UNIT) PO CAPS
50000.0000 [IU] | ORAL_CAPSULE | ORAL | 0 refills | Status: DC
  Filled 2022-07-12 – 2022-07-15 (×2): qty 4, 28d supply, fill #0

## 2022-07-15 ENCOUNTER — Other Ambulatory Visit (HOSPITAL_COMMUNITY): Payer: Self-pay

## 2022-07-18 NOTE — Progress Notes (Unsigned)
Chief Complaint:   OBESITY Stacey Carlson is here to discuss her progress with her obesity treatment plan along with follow-up of her obesity related diagnoses. Stacey Carlson is on keeping a food journal and adhering to recommended goals of 1500-1600 calories and 90 protein and states she is following her eating plan approximately 75% of the time. Stacey Carlson states she is walking and walking at the park 30 minutes 5 times per week.  Today's visit was #: 4 Starting weight: 251 lbs Starting date: 05/12/2022 Today's weight: 247 lbs Today's date: 07/12/2022 Total lbs lost to date: 4 lbs Total lbs lost since last in-office visit: +1 lb  Interim History: Working on portion control.  Rarely snacks.  Has some sugar cravings.  Tried out zero sugar coke.  Still drinking some juice, gets some water in.  Has increased walking, gets this in at work and has not really started treadmill.  Still eating out 1-2 times per week.  Positive for menstrual food cravings.   Subjective:   1. Prediabetes Recently started metformin 500 mg once a day with food. Last A1c 6.0.  2. Vitamin D deficiency She is currently taking prescription vitamin D 50,000 IU each week. She denies nausea, vomiting or muscle weakness. Last vitamin d level was 6.9, energy level improving.   3. Major depressive disorder, remission status unspecified, unspecified whether recurrent On Lexapro 10 mg daily.  Mood is improving.  Has a good support system.   4. Elevated BP without diagnosis of hypertension Blood pressure elevated for the 1st time today.   Assessment/Plan:   1. Prediabetes Recheck labs in 1-2 months  Refill - metFORMIN (GLUCOPHAGE) 500 MG tablet; Take 1 tablet (500 mg total) by mouth daily with breakfast.  Dispense: 30 tablet; Refill: 0  2. Vitamin D deficiency Recheck Vitamin D level in 1-2 months.   Refill - Vitamin D, Ergocalciferol, (DRISDOL) 1.25 MG (50000 UNIT) CAPS capsule; Take 1 capsule (50,000 Units total) by mouth  every 7 (seven) days.  Dispense: 4 capsule; Refill: 0  3. Major depressive disorder, remission status unspecified, unspecified whether recurrent Continue Lexapro, consider Wellbutrin as add on for emotional eating.   4. Elevated BP without diagnosis of hypertension Actively working on weight loss.  Follow along next visit.   5. Obesity,current BMI 42.5 Offered change to journaling.  Reviewed restaurant nutrition facts and use of the calorie king.    Stacey Carlson is currently in the action stage of change. As such, her goal is to continue with weight loss efforts. She has agreed to keeping a food journal and adhering to recommended goals of 1600 calories and 90 protein daily.   Exercise goals:  track steps.  Behavioral modification strategies: increasing lean protein intake, increasing vegetables, increasing water intake, decreasing eating out, no skipping meals, keeping healthy foods in the home, better snacking choices, planning for success, and decreasing junk food.  Stacey Carlson has agreed to follow-up with our clinic in 3 weeks. She was informed of the importance of frequent follow-up visits to maximize her success with intensive lifestyle modifications for her multiple health conditions.   Objective:   Blood pressure (!) 150/82, pulse 76, temperature 98.2 F (36.8 C), height '5\' 4"'$  (1.626 m), weight 247 lb (112 kg), SpO2 99 %. Body mass index is 42.4 kg/m.  General: Cooperative, alert, well developed, in no acute distress. HEENT: Conjunctivae and lids unremarkable. Cardiovascular: Regular rhythm.  Lungs: Normal work of breathing. Neurologic: No focal deficits.   Lab Results  Component Value Date  CREATININE 0.67 11/15/2019   BUN 8 10/19/2019   NA 135 10/19/2019   K 3.6 10/19/2019   CL 105 10/19/2019   CO2 20 (L) 10/19/2019   Lab Results  Component Value Date   ALT 24 10/19/2019   AST 25 10/19/2019   ALKPHOS 134 (H) 10/19/2019   BILITOT 0.5 10/19/2019   Lab Results  Component  Value Date   HGBA1C 6.0 (H) 05/12/2022   Lab Results  Component Value Date   INSULIN 10.8 05/12/2022   Lab Results  Component Value Date   TSH 0.914 05/12/2022   Lab Results  Component Value Date   CHOL 198 05/12/2022   HDL 71 05/12/2022   LDLCALC 117 (H) 05/12/2022   TRIG 56 05/12/2022   CHOLHDL 2.7 02/16/2012   Lab Results  Component Value Date   VD25OH 6.9 (L) 05/12/2022   Lab Results  Component Value Date   WBC 5.3 05/12/2022   HGB 12.8 05/12/2022   HCT 38.5 05/12/2022   MCV 90 05/12/2022   PLT 284 05/12/2022   No results found for: "IRON", "TIBC", "FERRITIN"  Attestation Statements:   Reviewed by clinician on day of visit: allergies, medications, problem list, medical history, surgical history, family history, social history, and previous encounter notes.  I, Davy Pique, am acting as Location manager for Loyal Gambler, DO.  I have reviewed the above documentation for accuracy and completeness, and I agree with the above. -  ***

## 2022-08-09 ENCOUNTER — Ambulatory Visit (INDEPENDENT_AMBULATORY_CARE_PROVIDER_SITE_OTHER): Payer: No Typology Code available for payment source | Admitting: Family Medicine

## 2022-08-15 ENCOUNTER — Encounter (INDEPENDENT_AMBULATORY_CARE_PROVIDER_SITE_OTHER): Payer: Self-pay | Admitting: Family Medicine

## 2022-08-15 ENCOUNTER — Telehealth (INDEPENDENT_AMBULATORY_CARE_PROVIDER_SITE_OTHER): Payer: No Typology Code available for payment source | Admitting: Family Medicine

## 2022-08-15 ENCOUNTER — Other Ambulatory Visit (HOSPITAL_COMMUNITY): Payer: Self-pay

## 2022-08-15 DIAGNOSIS — E559 Vitamin D deficiency, unspecified: Secondary | ICD-10-CM | POA: Diagnosis not present

## 2022-08-15 DIAGNOSIS — R7303 Prediabetes: Secondary | ICD-10-CM

## 2022-08-15 DIAGNOSIS — Z6841 Body Mass Index (BMI) 40.0 and over, adult: Secondary | ICD-10-CM | POA: Diagnosis not present

## 2022-08-15 DIAGNOSIS — E66813 Obesity, class 3: Secondary | ICD-10-CM

## 2022-08-15 DIAGNOSIS — E669 Obesity, unspecified: Secondary | ICD-10-CM | POA: Diagnosis not present

## 2022-08-15 MED ORDER — VITAMIN D (ERGOCALCIFEROL) 1.25 MG (50000 UNIT) PO CAPS
50000.0000 [IU] | ORAL_CAPSULE | ORAL | 0 refills | Status: DC
  Filled 2022-08-15: qty 4, 28d supply, fill #0

## 2022-08-15 NOTE — Progress Notes (Signed)
TeleHealth Visit:  This visit was completed with telemedicine (audio/video) technology. Stacey Carlson has verbally consented to this TeleHealth visit. The patient is located at home, the provider is located at home. The participants in this visit include the listed provider and patient. The visit was conducted today via MyChart video.  OBESITY Stacey Carlson is here to discuss her progress with her obesity treatment plan along with follow-up of her obesity related diagnoses.   Today's visit was # 5 Starting weight: 251 lbs Starting date: 05/12/2022 Weight at last in office visit: 247 lbs on 07/12/22 Total weight loss: 4 lbs at last in office visit on 07/12/22. Today's reported weight: 243 lbs   Nutrition Plan: keeping a food journal and adhering to recommended goals of 1600 calories and 90 protein daily. .   Current exercise:  walking at the park 30 minutes 5 times per week.  Interim History: She is journaling sporadically.  She says she uses category 3 as a guideline.  She tries to remember to journal at the end of the day but forgets. She tends to eat out twice a day.  She has lunch at the cafeteria at work and dinner out. She says she gets home late which makes it hard to cook dinner. 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 In the evening her family will eat out at places such as [employer], [place] - may get steak, chicken, loaded baked potato soup, salad, etc..  She tries to make healthy choices.  Assessment/Plan:  1. Vitamin D Deficiency Vitamin D is very low at 6.9 (05/12/2022). She is on weekly prescription Vitamin D 50,000 IU.  Lab Results  Component Value Date   VD25OH 6.9 (L) 05/12/2022    Plan: Refill prescription vitamin D 50,000 IU weekly.  2. Prediabetes Stacey Carlson has a diagnosis of prediabetes based on her elevated HgA1c. Medication(s): Metformin 500 mg daily with breakfast. Lab Results  Component Value Date    HGBA1C 6.0 (H) 05/12/2022   Lab Results  Component Value Date   INSULIN 10.8 05/12/2022    Plan: Continue metformin 500 mg daily with breakfast.  3. Obesity: Current BMI 42.3 Stacey Carlson is currently in the action stage of change. As such, her goal is to continue with weight loss efforts.  She has agreed to keeping a food journal and adhering to recommended goals of 1600 calories and 90 grams protein.   1.  Encouraged her to journal her food when she has the meal in order to guide her food choices later in the day. 2.  Encouraged her to pack her lunch. 3.  Discussed cooking on Sunday and making enough for leftovers.  Exercise goals: as is  Behavioral modification strategies: increasing lean protein intake, decreasing simple carbohydrates, decreasing eating out, meal planning and cooking strategies, and planning for success.  Stacey Carlson has agreed to follow-up with our clinic in 4 weeks.   No orders of the defined types were placed in this encounter.   Medications Discontinued During This Encounter  Medication Reason   Vitamin D, Ergocalciferol, (DRISDOL) 1.25 MG (50000 UNIT) CAPS capsule Reorder     Meds ordered this encounter  Medications   Vitamin D, Ergocalciferol, (DRISDOL) 1.25 MG (50000 UNIT) CAPS capsule    Sig: Take 1 capsule (50,000 Units total) by mouth every 7 (seven) days.    Dispense:  4 capsule    Refill:  0    Order Specific Question:   Supervising Provider  Answer:   Dell Ponto [[id number]]      Objective:   VITALS: Per patient if applicable, see vitals. GENERAL: Alert and in no acute distress. CARDIOPULMONARY: No increased WOB. Speaking in clear sentences.  PSYCH: Pleasant and cooperative. Speech normal rate and rhythm. Affect is appropriate. Insight and judgement are appropriate. Attention is focused, linear, and appropriate.  NEURO: Oriented as arrived to appointment on time with no prompting.   Lab Results  Component Value Date   CREATININE 0.67  11/15/2019   BUN 8 10/19/2019   NA 135 10/19/2019   K 3.6 10/19/2019   CL 105 10/19/2019   CO2 20 (L) 10/19/2019   Lab Results  Component Value Date   ALT 24 10/19/2019   AST 25 10/19/2019   ALKPHOS 134 (H) 10/19/2019   BILITOT 0.5 10/19/2019   Lab Results  Component Value Date   HGBA1C 6.0 (H) 05/12/2022   Lab Results  Component Value Date   INSULIN 10.8 05/12/2022   Lab Results  Component Value Date   TSH 0.914 05/12/2022   Lab Results  Component Value Date   CHOL 198 05/12/2022   HDL 71 05/12/2022   LDLCALC 117 (H) 05/12/2022   TRIG 56 05/12/2022   CHOLHDL 2.7 02/16/2012   Lab Results  Component Value Date   WBC 5.3 05/12/2022   HGB 12.8 05/12/2022   HCT 38.5 05/12/2022   MCV 90 05/12/2022   PLT 284 05/12/2022   No results found for: "IRON", "TIBC", "FERRITIN" Lab Results  Component Value Date   VD25OH 6.9 (L) 05/12/2022    Attestation Statements:   Reviewed by clinician on day of visit: allergies, medications, problem list, medical history, surgical history, family history, social history, and previous encounter notes.

## 2022-08-23 ENCOUNTER — Other Ambulatory Visit (INDEPENDENT_AMBULATORY_CARE_PROVIDER_SITE_OTHER): Payer: Self-pay | Admitting: Family Medicine

## 2022-08-23 ENCOUNTER — Other Ambulatory Visit (HOSPITAL_COMMUNITY): Payer: Self-pay

## 2022-08-23 DIAGNOSIS — R7303 Prediabetes: Secondary | ICD-10-CM

## 2022-08-24 ENCOUNTER — Other Ambulatory Visit (HOSPITAL_COMMUNITY): Payer: Self-pay

## 2022-08-25 ENCOUNTER — Other Ambulatory Visit (HOSPITAL_COMMUNITY): Payer: Self-pay

## 2022-09-12 ENCOUNTER — Encounter (INDEPENDENT_AMBULATORY_CARE_PROVIDER_SITE_OTHER): Payer: Self-pay | Admitting: Family Medicine

## 2022-09-12 ENCOUNTER — Other Ambulatory Visit (HOSPITAL_COMMUNITY): Payer: Self-pay

## 2022-09-12 ENCOUNTER — Ambulatory Visit (INDEPENDENT_AMBULATORY_CARE_PROVIDER_SITE_OTHER): Payer: No Typology Code available for payment source | Admitting: Family Medicine

## 2022-09-12 VITALS — BP 135/77 | HR 72 | Temp 98.0°F | Ht 64.0 in | Wt 244.0 lb

## 2022-09-12 DIAGNOSIS — R7303 Prediabetes: Secondary | ICD-10-CM | POA: Diagnosis not present

## 2022-09-12 DIAGNOSIS — E559 Vitamin D deficiency, unspecified: Secondary | ICD-10-CM | POA: Diagnosis not present

## 2022-09-12 DIAGNOSIS — E669 Obesity, unspecified: Secondary | ICD-10-CM | POA: Diagnosis not present

## 2022-09-12 DIAGNOSIS — Z6841 Body Mass Index (BMI) 40.0 and over, adult: Secondary | ICD-10-CM | POA: Diagnosis not present

## 2022-09-12 MED ORDER — METFORMIN HCL 500 MG PO TABS
500.0000 mg | ORAL_TABLET | Freq: Every day | ORAL | 0 refills | Status: DC
  Filled 2022-09-12 – 2022-09-21 (×2): qty 30, 30d supply, fill #0

## 2022-09-12 MED ORDER — VITAMIN D (ERGOCALCIFEROL) 1.25 MG (50000 UNIT) PO CAPS
50000.0000 [IU] | ORAL_CAPSULE | ORAL | 0 refills | Status: DC
  Filled 2022-09-12 – 2022-09-21 (×2): qty 4, 28d supply, fill #0

## 2022-09-20 ENCOUNTER — Other Ambulatory Visit (HOSPITAL_COMMUNITY): Payer: Self-pay

## 2022-09-21 ENCOUNTER — Other Ambulatory Visit (HOSPITAL_COMMUNITY): Payer: Self-pay

## 2022-09-26 NOTE — Progress Notes (Signed)
Chief Complaint:   OBESITY Stacey Carlson is here to discuss her progress with her obesity treatment plan along with follow-up of her obesity related diagnoses. Stacey Carlson is on keeping a food journal and adhering to recommended goals of 1600 calories and 90 grams of protein and states she is following her eating plan approximately 60% of the time. Stacey Carlson states she is doing 0 minutes 0 times per week.  Today's visit was #: 6 Starting weight: 251 lbs Starting date: 05/12/2022 Today's weight: 244 lbs Today's date: 09/12/2022 Total lbs lost to date: 7 Total lbs lost since last in-office visit: 3  Interim History: Stacey Carlson has done well with weight loss. Her hunger is controlled. She does report craving some milk shakes lately. We discussed snacking strategies. She has questions about [place].  Subjective:   1. Prediabetes Stacey Carlson is taking metformin 500 mg daily, with no side effects. Her last A1c was 6.0 and insulin 10.8. She is working on weight loss and healthy eating.   2. Vitamin D deficiency Stacey Carlson is taking Vitamin D prescription. Her last Vitamin D level was 6.9.  Assessment/Plan:   1. Prediabetes Stacey Carlson will continue metformin, eating plan, and exercise. We will refill metformin for 1 month.   - metFORMIN (GLUCOPHAGE) 500 MG tablet; Take 1 tablet (500 mg total) by mouth daily with breakfast.  Dispense: 30 tablet; Refill: 0  2. Vitamin D deficiency Stacey Carlson will continue prescription Vitamin D 50,000 IU every week, and we will refill for 1 month. She will follow-up for routine testing of Vitamin D, at least 2-3 times per year to avoid over-replacement.  - Vitamin D, Ergocalciferol, (DRISDOL) 1.25 MG (50000 UNIT) CAPS capsule; Take 1 capsule (50,000 Units total) by mouth every 7 (seven) days.  Dispense: 4 capsule; Refill: 0  3. Obesity,Current BMI 41.9 Stacey Carlson is currently in the action stage of change. As such, her goal is to continue with weight loss efforts. She has agreed to keeping  a food journal and adhering to recommended goals of 1600 calories and 90+ grams of protein daily.   The patient's question about [facility] were answered. We discussed the importance of getting the right combinations of food in, and good protein intake.   Exercise goals: All adults should avoid inactivity. Some physical activity is better than none, and adults who participate in any amount of physical activity gain some health benefits.  Behavioral modification strategies: increasing lean protein intake, decreasing simple carbohydrates, better snacking choices, and holiday eating strategies .  Stacey Carlson has agreed to follow-up with our clinic in 2 to 3 weeks. She was informed of the importance of frequent follow-up visits to maximize her success with intensive lifestyle modifications for her multiple health conditions.   Objective:   Blood pressure 135/77, pulse 72, temperature 98 F (36.7 C), height '5\' 4"'$  (1.626 m), weight 244 lb (110.7 kg), SpO2 100 %. Body mass index is 41.88 kg/m.  General: Cooperative, alert, well developed, in no acute distress. HEENT: Conjunctivae and lids unremarkable. Cardiovascular: Regular rhythm.  Lungs: Normal work of breathing. Neurologic: No focal deficits.   Lab Results  Component Value Date   CREATININE 0.67 11/15/2019   BUN 8 10/19/2019   NA 135 10/19/2019   K 3.6 10/19/2019   CL 105 10/19/2019   CO2 20 (L) 10/19/2019   Lab Results  Component Value Date   ALT 24 10/19/2019   AST 25 10/19/2019   ALKPHOS 134 (H) 10/19/2019   BILITOT 0.5 10/19/2019   Lab Results  Component Value Date   HGBA1C 6.0 (H) 05/12/2022   Lab Results  Component Value Date   INSULIN 10.8 05/12/2022   Lab Results  Component Value Date   TSH 0.914 05/12/2022   Lab Results  Component Value Date   CHOL 198 05/12/2022   HDL 71 05/12/2022   LDLCALC 117 (H) 05/12/2022   TRIG 56 05/12/2022   CHOLHDL 2.7 02/16/2012   Lab Results  Component Value Date   VD25OH 6.9 (L)  05/12/2022   Lab Results  Component Value Date   WBC 5.3 05/12/2022   HGB 12.8 05/12/2022   HCT 38.5 05/12/2022   MCV 90 05/12/2022   PLT 284 05/12/2022   No results found for: "IRON", "TIBC", "FERRITIN"  Attestation Statements:   Reviewed by clinician on day of visit: allergies, medications, problem list, medical history, surgical history, family history, social history, and previous encounter notes.   I, Trixie Dredge, am acting as transcriptionist for Dennard Nip, MD.  I have reviewed the above documentation for accuracy and completeness, and I agree with the above. -  Dennard Nip, MD

## 2022-10-03 ENCOUNTER — Ambulatory Visit (INDEPENDENT_AMBULATORY_CARE_PROVIDER_SITE_OTHER): Payer: No Typology Code available for payment source | Admitting: Family Medicine

## 2022-10-06 ENCOUNTER — Ambulatory Visit (INDEPENDENT_AMBULATORY_CARE_PROVIDER_SITE_OTHER): Payer: No Typology Code available for payment source | Admitting: Family Medicine

## 2022-10-06 ENCOUNTER — Telehealth (INDEPENDENT_AMBULATORY_CARE_PROVIDER_SITE_OTHER): Payer: No Typology Code available for payment source | Admitting: Physician Assistant

## 2022-10-06 ENCOUNTER — Other Ambulatory Visit (HOSPITAL_COMMUNITY): Payer: Self-pay

## 2022-10-06 DIAGNOSIS — Z6841 Body Mass Index (BMI) 40.0 and over, adult: Secondary | ICD-10-CM | POA: Diagnosis not present

## 2022-10-06 DIAGNOSIS — R7303 Prediabetes: Secondary | ICD-10-CM

## 2022-10-06 DIAGNOSIS — E669 Obesity, unspecified: Secondary | ICD-10-CM | POA: Diagnosis not present

## 2022-10-06 DIAGNOSIS — E559 Vitamin D deficiency, unspecified: Secondary | ICD-10-CM | POA: Diagnosis not present

## 2022-10-06 MED ORDER — VITAMIN D (ERGOCALCIFEROL) 1.25 MG (50000 UNIT) PO CAPS
50000.0000 [IU] | ORAL_CAPSULE | ORAL | 0 refills | Status: DC
  Filled 2022-10-06: qty 4, 28d supply, fill #0

## 2022-10-20 ENCOUNTER — Encounter (INDEPENDENT_AMBULATORY_CARE_PROVIDER_SITE_OTHER): Payer: Self-pay | Admitting: Physician Assistant

## 2022-10-20 NOTE — Progress Notes (Signed)
TeleHealth Visit:  Due to the COVID-19 pandemic, this visit was completed with telemedicine (audio/video) technology to reduce patient and provider exposure as well as to preserve personal protective equipment.   Stacey Carlson has verbally consented to this TeleHealth visit. The patient is located at home, the provider is located at the [facility] office. The participants in this visit include the listed provider and patient. The visit was conducted today via MyChart Video.  Chief Complaint: OBESITY Stacey Carlson is here to discuss her progress with her obesity treatment plan along with follow-up of her obesity related diagnoses. Stacey Carlson is on keeping a food journal and adhering to recommended goals of 1600 calories and 90 grams of protein and states she is following her eating plan approximately 75% of the time. Stacey Carlson states she is exercising 0 minutes 0 times per week.  Today's visit was #: 7 Starting weight: 251 lbs Starting date: 05/12/2022  Interim History: Stacey Carlson has done well with weight loss. Feels she is down 2-3 more lbs. Hunger--controlled--cravings occasionally for sweets.  Subjective:   1. Vitamin D deficiency Stacey Carlson is currently taking prescription Vit D 50,000 IU once a week. Denies any nausea, vomiting or muscle weakness. Last vitamin D level 6.9 05/12/22- not at goal.   2. Prediabetes Stacey Carlson is taking Metformin 500 mg daily . A1c at 6.0/insulin 10.8 on 05/12/22- Not at goal. Minimal side effects--some occasional diarrhea, maybe with simple carbohydrates, but generally well tolerated.   Assessment/Plan:   1. Vitamin D deficiency Will Continue/refill Vit D 50K IU once a week for 1 month with 0 refills.  -Refill Vitamin D, Ergocalciferol, (DRISDOL) 1.25 MG (50000 UNIT) CAPS capsule; Take 1 capsule (50,000 Units total) by mouth every 7 (seven) days.  Dispense: 4 capsule; Refill: 0  2. Prediabetes Continue Metformin--Continue healthy eating plan to decrease simple  carbohydrates, increase lean proteins and exercise to promote weight loss.   3. Obesity,Current BMI 41.9 Stacey Carlson is currently in the action stage of change. As such, her goal is to continue with weight loss efforts. She has agreed to the Category 3 Plan and keeping a food journal and adhering to recommended goals of 1600 calories and 90+ grams of protein daily.   Exercise goals: All adults should avoid inactivity. Some physical activity is better than none, and adults who participate in any amount of physical activity gain some health benefits.  Behavioral modification strategies: increasing lean protein intake, increasing water intake, and holiday eating strategies .  Stacey Carlson has agreed to follow-up with our clinic in 5 weeks. She was informed of the importance of frequent follow-up visits to maximize her success with intensive lifestyle modifications for her multiple health conditions.  Objective:   VITALS: Per patient if applicable, see vitals. GENERAL: Alert and in no acute distress. CARDIOPULMONARY: No increased WOB. Speaking in clear sentences.  PSYCH: Pleasant and cooperative. Speech normal rate and rhythm. Affect is appropriate. Insight and judgement are appropriate. Attention is focused, linear, and appropriate.  NEURO: Oriented as arrived to appointment on time with no prompting.   Lab Results  Component Value Date   CREATININE 0.67 11/15/2019   BUN 8 10/19/2019   NA 135 10/19/2019   K 3.6 10/19/2019   CL 105 10/19/2019   CO2 20 (L) 10/19/2019   Lab Results  Component Value Date   ALT 24 10/19/2019   AST 25 10/19/2019   ALKPHOS 134 (H) 10/19/2019   BILITOT 0.5 10/19/2019   Lab Results  Component Value Date  HGBA1C 6.0 (H) 05/12/2022   Lab Results  Component Value Date   INSULIN 10.8 05/12/2022   Lab Results  Component Value Date   TSH 0.914 05/12/2022   Lab Results  Component Value Date   CHOL 198 05/12/2022   HDL 71 05/12/2022   LDLCALC 117 (H) 05/12/2022    TRIG 56 05/12/2022   CHOLHDL 2.7 02/16/2012   Lab Results  Component Value Date   VD25OH 6.9 (L) 05/12/2022   Lab Results  Component Value Date   WBC 5.3 05/12/2022   HGB 12.8 05/12/2022   HCT 38.5 05/12/2022   MCV 90 05/12/2022   PLT 284 05/12/2022   No results found for: "IRON", "TIBC", "FERRITIN"  Attestation Statements:   Reviewed by clinician on day of visit: allergies, medications, problem list, medical history, surgical history, family history, social history, and previous encounter notes.  I, Brendell Tyus, am acting as transcriptionist for [employer], PA.  I have reviewed the above documentation for accuracy and completeness, and I agree with the above. - Trusten Hume,PA-C

## 2022-11-08 ENCOUNTER — Ambulatory Visit (INDEPENDENT_AMBULATORY_CARE_PROVIDER_SITE_OTHER): Payer: Self-pay | Admitting: Physician Assistant

## 2022-11-08 ENCOUNTER — Other Ambulatory Visit (HOSPITAL_COMMUNITY): Payer: Self-pay

## 2022-11-08 ENCOUNTER — Encounter (INDEPENDENT_AMBULATORY_CARE_PROVIDER_SITE_OTHER): Payer: Self-pay | Admitting: Physician Assistant

## 2022-11-08 VITALS — BP 132/79 | HR 64 | Temp 97.8°F | Ht 64.0 in | Wt 244.0 lb

## 2022-11-08 DIAGNOSIS — R7303 Prediabetes: Secondary | ICD-10-CM

## 2022-11-08 DIAGNOSIS — Z6841 Body Mass Index (BMI) 40.0 and over, adult: Secondary | ICD-10-CM

## 2022-11-08 DIAGNOSIS — E669 Obesity, unspecified: Secondary | ICD-10-CM

## 2022-11-08 DIAGNOSIS — E559 Vitamin D deficiency, unspecified: Secondary | ICD-10-CM

## 2022-11-08 DIAGNOSIS — E7849 Other hyperlipidemia: Secondary | ICD-10-CM

## 2022-11-08 MED ORDER — METFORMIN HCL 500 MG PO TABS
500.0000 mg | ORAL_TABLET | Freq: Every day | ORAL | 0 refills | Status: DC
  Filled 2022-11-08: qty 30, 30d supply, fill #0

## 2022-11-08 MED ORDER — VITAMIN D (ERGOCALCIFEROL) 1.25 MG (50000 UNIT) PO CAPS
50000.0000 [IU] | ORAL_CAPSULE | ORAL | 0 refills | Status: DC
  Filled 2022-11-08: qty 4, 28d supply, fill #0

## 2022-11-09 LAB — CMP14+EGFR
ALT: 12 IU/L (ref 0–32)
AST: 11 IU/L (ref 0–40)
Albumin/Globulin Ratio: 1.5 (ref 1.2–2.2)
Albumin: 4.2 g/dL (ref 3.9–4.9)
Alkaline Phosphatase: 81 IU/L (ref 44–121)
BUN/Creatinine Ratio: 16 (ref 9–23)
BUN: 9 mg/dL (ref 6–24)
Bilirubin Total: 0.3 mg/dL (ref 0.0–1.2)
CO2: 25 mmol/L (ref 20–29)
Calcium: 9.1 mg/dL (ref 8.7–10.2)
Chloride: 103 mmol/L (ref 96–106)
Creatinine, Ser: 0.57 mg/dL (ref 0.57–1.00)
Globulin, Total: 2.8 g/dL (ref 1.5–4.5)
Glucose: 98 mg/dL (ref 70–99)
Potassium: 4.3 mmol/L (ref 3.5–5.2)
Sodium: 139 mmol/L (ref 134–144)
Total Protein: 7 g/dL (ref 6.0–8.5)
eGFR: 112 mL/min/{1.73_m2} (ref >59)

## 2022-11-09 LAB — LIPID PANEL WITH LDL/HDL RATIO
Cholesterol, Total: 202 mg/dL — ABNORMAL HIGH (ref 100–199)
HDL: 68 mg/dL (ref >39)
LDL Chol Calc (NIH): 120 mg/dL — ABNORMAL HIGH (ref 0–99)
LDL/HDL Ratio: 1.8 ratio (ref 0.0–3.2)
Triglycerides: 77 mg/dL (ref 0–149)
VLDL Cholesterol Cal: 14 mg/dL (ref 5–40)

## 2022-11-09 LAB — HEMOGLOBIN A1C
Est. average glucose Bld gHb Est-mCnc: 128 mg/dL
Hgb A1c MFr Bld: 6.1 % — ABNORMAL HIGH (ref 4.8–5.6)

## 2022-11-09 LAB — INSULIN, RANDOM: INSULIN: 10.6 u[IU]/mL (ref 2.6–24.9)

## 2022-11-09 LAB — VITAMIN D 25 HYDROXY (VIT D DEFICIENCY, FRACTURES): Vit D, 25-Hydroxy: 24.8 ng/mL — ABNORMAL LOW (ref 30.0–100.0)

## 2022-11-14 ENCOUNTER — Other Ambulatory Visit (HOSPITAL_COMMUNITY): Payer: Self-pay

## 2022-11-14 MED ORDER — ESCITALOPRAM OXALATE 10 MG PO TABS
10.0000 mg | ORAL_TABLET | Freq: Every day | ORAL | 1 refills | Status: DC
  Filled 2022-11-14: qty 30, 30d supply, fill #0
  Filled 2022-12-29 (×2): qty 30, 30d supply, fill #1

## 2022-11-14 NOTE — Progress Notes (Unsigned)
Chief Complaint:   OBESITY Stacey Carlson is here to discuss her progress with her obesity treatment plan along with follow-up of her obesity related diagnoses. Stacey Carlson is on keeping a food journal and adhering to recommended goals of 1600 calories and 90+ grams protein and states she is following her eating plan approximately 75% of the time. Stacey Carlson states she is walking at work.  Today's visit was #: 8 Starting weight: 251 lbs Starting date: 05/12/2022 Today's weight: 244 lbs Today's date: 11/08/2021 Total lbs lost to date: 7 Total lbs lost since last in-office visit: 0  Interim History: Stacey Carlson has done well with maintaining weight loss over the holidays. She is working to get back on tract with prudent nutritional plan to decrease simple carbs, decrease saturated fats, and increasing lean protein. She has her daughter's 47 year old birthday soon! Pt's husband just started a new job.  Subjective:   1. Prediabetes Stacey Carlson's last A1c was 6.0 with an insulin level of 10.8 on 05/12/2022. She is taking Metformin 500 mg daily with no side effects. She is working on increasing lean protein and decreasing simple carbs.  2. Vitamin D deficiency Her Vitamin D level was 6.9 on 05/12/2022. Pt is taking Ergocalciferol once weekly with no side effects.  3. Other hyperlipidemia Stacey Carlson had an LDL of 117, HDL 71, and triglycerides 56 on 05/12/2022. She is not on statin therapy. Pt is working on decreasing saturated fat and cholesterol in diet.  Assessment/Plan:   1. Prediabetes Continue Metformin, prudent nutritional plan, and exercise.  Refill- metFORMIN (GLUCOPHAGE) 500 MG tablet; Take 1 tablet (500 mg total) by mouth daily with breakfast.  Dispense: 30 tablet; Refill: 0  Lab/Orders today: - CMP14+EGFR - Hemoglobin A1c - Insulin, random - Lipid Panel With LDL/HDL Ratio  2. Vitamin D deficiency Low Vitamin D level contributes to fatigue and are associated with obesity, breast, and colon cancer. She  agrees to continue to take prescription Vitamin D 50,000 IU every week and will follow-up for routine testing of Vitamin D, at least 2-3 times per year to avoid over-replacement.  Refill- Vitamin D, Ergocalciferol, (DRISDOL) 1.25 MG (50000 UNIT) CAPS capsule; Take 1 capsule (50,000 Units total) by mouth every 7 (seven) days.  Dispense: 4 capsule; Refill: 0  Lab/Orders today: - VITAMIN D 25 Hydroxy (Vit-D Deficiency, Fractures)  3. Other hyperlipidemia Continue prudent nutritional plan and exercise.  4. Obesity,Current BMI 42.0 Stacey Carlson is currently in the action stage of change. As such, her goal is to continue with weight loss efforts. She has agreed to the Category 3 Plan and keeping a food journal and adhering to recommended goals of 1600 calories and 90+ grams protein.   Exercise goals:  As is  Behavioral modification strategies: increasing lean protein intake, decreasing simple carbohydrates, meal planning and cooking strategies, and planning for success.  Stacey Carlson has agreed to follow-up with our clinic in 4 weeks. She was informed of the importance of frequent follow-up visits to maximize her success with intensive lifestyle modifications for her multiple health conditions.   Stacey Carlson was informed we would discuss her lab results at her next visit unless there is a critical issue that needs to be addressed sooner. Stacey Carlson agreed to keep her next visit at the agreed upon time to discuss these results.  Objective:   Blood pressure 132/79, pulse 64, temperature 97.8 F (36.6 C), height '5\' 4"'$  (1.626 m), weight 244 lb (110.7 kg), SpO2 100 %. Body mass index is 41.88 kg/m.  General:  Cooperative, alert, well developed, in no acute distress. HEENT: Conjunctivae and lids unremarkable. Cardiovascular: Regular rhythm.  Lungs: Normal work of breathing. Neurologic: No focal deficits.   Lab Results  Component Value Date   CREATININE 0.57 11/08/2022   BUN 9 11/08/2022   NA 139 11/08/2022   K  4.3 11/08/2022   CL 103 11/08/2022   CO2 25 11/08/2022   Lab Results  Component Value Date   ALT 12 11/08/2022   AST 11 11/08/2022   ALKPHOS 81 11/08/2022   BILITOT 0.3 11/08/2022   Lab Results  Component Value Date   HGBA1C 6.1 (H) 11/08/2022   HGBA1C 6.0 (H) 05/12/2022   Lab Results  Component Value Date   INSULIN 10.6 11/08/2022   INSULIN 10.8 05/12/2022   Lab Results  Component Value Date   TSH 0.914 05/12/2022   Lab Results  Component Value Date   CHOL 202 (H) 11/08/2022   HDL 68 11/08/2022   LDLCALC 120 (H) 11/08/2022   TRIG 77 11/08/2022   CHOLHDL 2.7 02/16/2012   Lab Results  Component Value Date   VD25OH 24.8 (L) 11/08/2022   VD25OH 6.9 (L) 05/12/2022   Lab Results  Component Value Date   WBC 5.3 05/12/2022   HGB 12.8 05/12/2022   HCT 38.5 05/12/2022   MCV 90 05/12/2022   PLT 284 05/12/2022   Attestation Statements:   Reviewed by clinician on day of visit: allergies, medications, problem list, medical history, surgical history, family history, social history, and previous encounter notes.  I, Kathlene November, BS, CMA, am acting as transcriptionist for [employer], PA-C.  I have reviewed the above documentation for accuracy and completeness, and I agree with the above. -  ***

## 2022-12-06 ENCOUNTER — Ambulatory Visit (INDEPENDENT_AMBULATORY_CARE_PROVIDER_SITE_OTHER): Payer: 59 | Admitting: Physician Assistant

## 2022-12-06 ENCOUNTER — Encounter (INDEPENDENT_AMBULATORY_CARE_PROVIDER_SITE_OTHER): Payer: Self-pay | Admitting: Physician Assistant

## 2022-12-06 ENCOUNTER — Other Ambulatory Visit (HOSPITAL_COMMUNITY): Payer: Self-pay

## 2022-12-06 VITALS — BP 137/84 | HR 57 | Temp 98.0°F | Ht 64.0 in | Wt 242.8 lb

## 2022-12-06 DIAGNOSIS — E669 Obesity, unspecified: Secondary | ICD-10-CM | POA: Diagnosis not present

## 2022-12-06 DIAGNOSIS — Z6841 Body Mass Index (BMI) 40.0 and over, adult: Secondary | ICD-10-CM

## 2022-12-06 DIAGNOSIS — E559 Vitamin D deficiency, unspecified: Secondary | ICD-10-CM

## 2022-12-06 DIAGNOSIS — R7303 Prediabetes: Secondary | ICD-10-CM

## 2022-12-06 DIAGNOSIS — E7849 Other hyperlipidemia: Secondary | ICD-10-CM | POA: Diagnosis not present

## 2022-12-06 MED ORDER — METFORMIN HCL 500 MG PO TABS
500.0000 mg | ORAL_TABLET | Freq: Every day | ORAL | 0 refills | Status: DC
  Filled 2022-12-06: qty 30, 30d supply, fill #0

## 2022-12-06 MED ORDER — VITAMIN D (ERGOCALCIFEROL) 1.25 MG (50000 UNIT) PO CAPS
50000.0000 [IU] | ORAL_CAPSULE | ORAL | 0 refills | Status: DC
  Filled 2022-12-06: qty 4, 28d supply, fill #0

## 2022-12-14 NOTE — Progress Notes (Signed)
Chief Complaint:   OBESITY Stacey Carlson is here to discuss her progress with her obesity treatment plan along with follow-up of her obesity related diagnoses. Stacey Carlson is on the Category 3 Plan and keeping a food journal and adhering to recommended goals of 1600 calories and 90+ Grams of protein and states she is following her eating plan approximately 70% of the time. Stacey Carlson states she is exercising 0 minutes 0 times per week.  Today's visit was #: 9 Starting weight: 251 lbs Starting date: 05/12/2022 Today's weight: 242 lbs Today's date: 12/06/2022 Total lbs lost to date: 9 lbs Total lbs lost since last in-office visit: 2  Interim History: Stacey Carlson has done well overall with weight loss. She has been struggling with journaling, and is working more on portion control.  She does note she has been eating later at night, around 9 PM and we discussed strategies to eat earlier in the evening.  She also has a 55-year-old daughter who has some specific nutritional needs and is struggling to maintain both her meal plan and the daughters. She is having difficulty exercising consistently due to limited time and we discussed trying to work and some walking or other light exercise at least 10 minutes several times weekly.  Subjective:   1. Prediabetes Labs discussed during visit today.  On Metformin 500 mg daily.  Denies any side effects. On 11/08/22, A1c was 6.1 slight increase, insulin was 10.6 slight decrease.  Working on decreasing simple carbs, increasing protein, healthy eating and exercise to promote weight loss, improve glycemic control and prevent progression to type 2 diabetes.  2. Vitamin D deficiency Labs discussed during visit today.  On 11/08/22 Vit D level of 24.8-improving.  On ergocalciferol once weekly.  Denies any side effects.  Endorses fatigue.  3. Other hyperlipidemia On 11/08/22, HDL of 68- at goal.  LDL of 120-not at goal.  Stacey Carlson of 77- at goal.  Not on medications.  She is working on  decreasing saturated fat and cholesterol in her diet to promote weight loss and improve lipid profile.  Assessment/Plan:   1. Prediabetes Continue/Refill Metformin 500 mg daily for 1 month with 0 refills.  Continue Prescribed Nutrition Plan and exercise to promote weight loss, improve glycemic control prevent progression to type 2 diabetes.   -Refill metFORMIN (GLUCOPHAGE) 500 MG tablet; Take 1 tablet (500 mg total) by mouth daily with breakfast.  Dispense: 30 tablet; Refill: 0  2. Vitamin D deficiency We will refill ergocalciferol once a week for 1 month with 0 refills.  -Refill Vitamin D, Ergocalciferol, (DRISDOL) 1.25 MG (50000 UNIT) CAPS capsule; Take 1 capsule (50,000 Units total) by mouth every 7 (seven) days.  Dispense: 4 capsule; Refill: 0  3. Other hyperlipidemia Working on decreasing simple carbs, decreasing sat fats, decreasing cholesterol, increasing lean protein to promote weight loss and improve lipid profile..  Will follow up in 3-4 months.  4. BMI 40.0-44.9, adult (Stacey Carlson), Current BMI 41.7  5. Obesity (Stacey Carlson)- starting BMI-43.08 Stacey Carlson is currently in the action stage of change. As such, her goal is to continue with weight loss efforts. She has agreed to the Category 3 Plan and keeping a food journal and adhering to recommended goals of 1600 calories and 90+gram of protein daily.   Exercise goals: All adults should avoid inactivity. Some physical activity is better than none, and adults who participate in any amount of physical activity gain some health benefits.  Behavioral modification strategies: increasing lean protein intake, decreasing simple carbohydrates, meal  planning and cooking strategies, and planning for success.  Kilani has agreed to follow-up with our clinic in 3 weeks. She was informed of the importance of frequent follow-up visits to maximize her success with intensive lifestyle modifications for her multiple health conditions.   Objective:   Blood pressure  137/84, pulse (!) 57, temperature 98 F (36.7 C), height 5' 4"$  (1.626 m), weight 242 lb 12.8 oz (110.1 kg), SpO2 96 %. Body mass index is 41.68 kg/m.  General: Cooperative, alert, well developed, in no acute distress. HEENT: Conjunctivae and lids unremarkable. Cardiovascular: Regular rhythm.  Lungs: Normal work of breathing. Neurologic: No focal deficits.   Lab Results  Component Value Date   CREATININE 0.57 11/08/2022   BUN 9 11/08/2022   NA 139 11/08/2022   K 4.3 11/08/2022   CL 103 11/08/2022   CO2 25 11/08/2022   Lab Results  Component Value Date   ALT 12 11/08/2022   AST 11 11/08/2022   ALKPHOS 81 11/08/2022   BILITOT 0.3 11/08/2022   Lab Results  Component Value Date   HGBA1C 6.1 (H) 11/08/2022   HGBA1C 6.0 (H) 05/12/2022   Lab Results  Component Value Date   INSULIN 10.6 11/08/2022   INSULIN 10.8 05/12/2022   Lab Results  Component Value Date   TSH 0.914 05/12/2022   Lab Results  Component Value Date   CHOL 202 (H) 11/08/2022   HDL 68 11/08/2022   LDLCALC 120 (H) 11/08/2022   TRIG 77 11/08/2022   CHOLHDL 2.7 02/16/2012   Lab Results  Component Value Date   VD25OH 24.8 (L) 11/08/2022   VD25OH 6.9 (L) 05/12/2022   Lab Results  Component Value Date   WBC 5.3 05/12/2022   HGB 12.8 05/12/2022   HCT 38.5 05/12/2022   MCV 90 05/12/2022   PLT 284 05/12/2022   No results found for: "IRON", "TIBC", "FERRITIN"  Attestation Statements:   Reviewed by clinician on day of visit: allergies, medications, problem list, medical history, surgical history, family history, social history, and previous encounter notes.  I, Stacey Carlson, am acting as transcriptionist for [employer], [employer].  I have reviewed the above documentation for accuracy and completeness, and I agree with the above. -  Stacey Hannula,PA-C

## 2022-12-27 ENCOUNTER — Encounter (INDEPENDENT_AMBULATORY_CARE_PROVIDER_SITE_OTHER): Payer: Self-pay | Admitting: Physician Assistant

## 2022-12-27 ENCOUNTER — Other Ambulatory Visit (HOSPITAL_COMMUNITY): Payer: Self-pay

## 2022-12-27 ENCOUNTER — Ambulatory Visit (INDEPENDENT_AMBULATORY_CARE_PROVIDER_SITE_OTHER): Payer: 59 | Admitting: Physician Assistant

## 2022-12-27 VITALS — BP 141/81 | HR 78 | Temp 97.6°F | Ht 64.0 in | Wt 242.0 lb

## 2022-12-27 DIAGNOSIS — E559 Vitamin D deficiency, unspecified: Secondary | ICD-10-CM

## 2022-12-27 DIAGNOSIS — R7303 Prediabetes: Secondary | ICD-10-CM | POA: Diagnosis not present

## 2022-12-27 DIAGNOSIS — E669 Obesity, unspecified: Secondary | ICD-10-CM

## 2022-12-27 DIAGNOSIS — Z6841 Body Mass Index (BMI) 40.0 and over, adult: Secondary | ICD-10-CM | POA: Diagnosis not present

## 2022-12-27 MED ORDER — VITAMIN D (ERGOCALCIFEROL) 1.25 MG (50000 UNIT) PO CAPS
50000.0000 [IU] | ORAL_CAPSULE | ORAL | 0 refills | Status: DC
  Filled 2022-12-27 – 2022-12-29 (×2): qty 4, 28d supply, fill #0

## 2022-12-27 NOTE — Progress Notes (Signed)
Chief Complaint:   OBESITY Ammara is here to discuss her progress with her obesity treatment plan along with follow-up of her obesity related diagnoses. Charlen is on the Category 3 Plan and keeping a food journal and adhering to recommended goals of 1600 calories and 90+ grams of  protein and states she is following her eating plan approximately 75% of the time. Beva states she is Gym 25-30 minutes 3 times per week.  Today's visit was #: 10 Starting weight: 251 lbs Starting date: 05/12/2022 Today's weight: 242 lbs Today's date: 12/27/2022 Total lbs lost to date: 9 lbs Total lbs lost since last in-office visit: 0   Interim History: Lamonica has done well with weight loss overall.  She is focusing on getting protein needs met.  She has started doing some strength training several times weekly and feels she has more energy.  Hunger is well controlled. She does feels she is having trouble eating everything on plan at times.   Bio Impedence scale: Muscle mass up 0.2 lb    Adipose mass down 1 lb    Total water up 2.8 lbs  Subjective:   1. Prediabetes A1C  6.1/ Insulin 10.6 11/08/22- nearing goals. On Metformin 500 mg daily. No GI side effects.   2. Vitamin D deficiency Level 24.8 11/08/22. Not at goal, but improving. On Ergocalciferol once weekly. No side effects.   3. BMI 40.0-44.9, adult ([place]), Current BMI 41.5  4. Obesity ([place])- starting BMI-43.08   Assessment/Plan:   1. Prediabetes Continue metformin 500 mg daily. Continue to work on nutrition plan and exercise to promote weight loss and improve glycemic control and prevent progression to Type 2 diabetes.   2. Vitamin D deficiency Vitamin D Deficiency Vitamin D is not at goal of 50.  Most recent vitamin D level was 24.8. She is on  prescription ergocalciferol 50,000 IU weekly. Lab Results  Component Value Date   VD25OH 24.8 (L) 11/08/2022   VD25OH 6.9 (L) 05/12/2022    Plan: Refill prescription vitamin D 50,000 IU  weekly.   3. BMI 40.0-44.9, adult ([place]), Current BMI 41.5  4. Obesity ([place])- starting BMI-43.08  Felix is currently in the action stage of change. As such, her goal is to continue with weight loss efforts. She has agreed to the Category 3 Plan and keeping a food journal and adhering to recommended goals of 1600 calories and 90+ grams of  protein.   Exercise goals:  Continue strength training 3 times weekly  Behavioral modification strategies: increasing lean protein intake, decreasing simple carbohydrates, no skipping meals, and planning for success.  Shelita has agreed to follow-up with our clinic in 4 weeks. She was informed of the importance of frequent follow-up visits to maximize her success with intensive lifestyle modifications for her multiple health conditions.    Objective:   Blood pressure (!) 141/81, pulse 78, temperature 97.6 F (36.4 C), height '5\' 4"'$  (1.626 m), weight 242 lb (109.8 kg), SpO2 98 %. Body mass index is 41.54 kg/m.  General: Cooperative, alert, well developed, in no acute distress. HEENT: Conjunctivae and lids unremarkable. Cardiovascular: Regular rhythm.  Lungs: Normal work of breathing. Neurologic: No focal deficits.   Lab Results  Component Value Date   CREATININE 0.57 11/08/2022   BUN 9 11/08/2022   NA 139 11/08/2022   K 4.3 11/08/2022   CL 103 11/08/2022   CO2 25 11/08/2022   Lab Results  Component Value Date   ALT 12 11/08/2022   AST  11 11/08/2022   ALKPHOS 81 11/08/2022   BILITOT 0.3 11/08/2022   Lab Results  Component Value Date   HGBA1C 6.1 (H) 11/08/2022   HGBA1C 6.0 (H) 05/12/2022   Lab Results  Component Value Date   INSULIN 10.6 11/08/2022   INSULIN 10.8 05/12/2022   Lab Results  Component Value Date   TSH 0.914 05/12/2022   Lab Results  Component Value Date   CHOL 202 (H) 11/08/2022   HDL 68 11/08/2022   LDLCALC 120 (H) 11/08/2022   TRIG 77 11/08/2022   CHOLHDL 2.7 02/16/2012   Lab Results  Component Value  Date   VD25OH 24.8 (L) 11/08/2022   VD25OH 6.9 (L) 05/12/2022   Lab Results  Component Value Date   WBC 5.3 05/12/2022   HGB 12.8 05/12/2022   HCT 38.5 05/12/2022   MCV 90 05/12/2022   PLT 284 05/12/2022   No results found for: "IRON", "TIBC", "FERRITIN"  Obesity Behavioral Intervention:   Approximately 15 minutes were spent on the discussion below.  ASK: We discussed the diagnosis of obesity with Kourtnie today and Cadynce agreed to give [place] permission to discuss obesity behavioral modification therapy today.  ASSESS: Jackline has the diagnosis of obesity and her BMI today is 41.5. Caryl is in the action stage of change.   ADVISE: Meliyah was educated on the multiple health risks of obesity as well as the benefit of weight loss to improve her health. She was advised of the need for long term treatment and the importance of lifestyle modifications to improve her current health and to decrease her risk of future health problems.  AGREE: Multiple dietary modification options and treatment options were discussed and Ikhlas agreed to follow the recommendations documented in the above note.  ARRANGE: Jeymi was educated on the importance of frequent visits to treat obesity as outlined per CMS and USPSTF guidelines and agreed to schedule her next follow up appointment today.  Attestation Statements:   Reviewed by clinician on day of visit: allergies, medications, problem list, medical history, surgical history, family history, social history, and previous encounter notes.  Mery Guadalupe,PA-C

## 2022-12-29 ENCOUNTER — Other Ambulatory Visit: Payer: Self-pay

## 2022-12-29 ENCOUNTER — Other Ambulatory Visit (HOSPITAL_COMMUNITY): Payer: Self-pay

## 2023-01-17 ENCOUNTER — Ambulatory Visit (INDEPENDENT_AMBULATORY_CARE_PROVIDER_SITE_OTHER): Payer: 59 | Admitting: Physician Assistant

## 2023-01-17 ENCOUNTER — Encounter (INDEPENDENT_AMBULATORY_CARE_PROVIDER_SITE_OTHER): Payer: Self-pay | Admitting: Physician Assistant

## 2023-01-17 ENCOUNTER — Other Ambulatory Visit (HOSPITAL_COMMUNITY): Payer: Self-pay

## 2023-01-17 VITALS — BP 128/79 | HR 64 | Temp 98.0°F | Ht 64.0 in | Wt 241.0 lb

## 2023-01-17 DIAGNOSIS — E559 Vitamin D deficiency, unspecified: Secondary | ICD-10-CM

## 2023-01-17 DIAGNOSIS — R7303 Prediabetes: Secondary | ICD-10-CM | POA: Diagnosis not present

## 2023-01-17 DIAGNOSIS — Z6841 Body Mass Index (BMI) 40.0 and over, adult: Secondary | ICD-10-CM

## 2023-01-17 DIAGNOSIS — E669 Obesity, unspecified: Secondary | ICD-10-CM | POA: Diagnosis not present

## 2023-01-17 MED ORDER — VITAMIN D (ERGOCALCIFEROL) 1.25 MG (50000 UNIT) PO CAPS
50000.0000 [IU] | ORAL_CAPSULE | ORAL | 0 refills | Status: DC
  Filled 2023-01-17 – 2023-02-07 (×2): qty 4, 28d supply, fill #0

## 2023-01-17 MED ORDER — METFORMIN HCL 500 MG PO TABS
500.0000 mg | ORAL_TABLET | Freq: Every day | ORAL | 0 refills | Status: DC
  Filled 2023-01-17 – 2023-02-07 (×2): qty 30, 30d supply, fill #0

## 2023-01-17 NOTE — Assessment & Plan Note (Signed)
Vitamin D Deficiency Vitamin D is not at goal of 50.  Most recent vitamin D level was 24.8. She is on  prescription ergocalciferol 50,000 IU weekly. Lab Results  Component Value Date   VD25OH 24.8 (L) 11/08/2022   VD25OH 6.9 (L) 05/12/2022    Plan: Refill prescription vitamin D 50,000 IU weekly. Recheck vit D level at least 2-3 times yearly to avoid over supplementation.

## 2023-01-17 NOTE — Progress Notes (Signed)
Office: [phone]  /  Fax: [phone]  WEIGHT SUMMARY AND BIOMETRICS  Vitals Temp: 62 F (36.7 C) BP: 128/79 Pulse Rate: 64 SpO2: 98 %   Anthropometric Measurements Height: 5\' 4"  (1.626 m) Weight: 241 lb (109.3 kg) BMI (Calculated): 41.35 Weight at Last Visit: 242 lb Weight Lost Since Last Visit: 1 lb Weight Gained Since Last Visit: 0 lb Starting Weight: 251 lb Total Weight Loss (lbs): 10 lb (4.536 kg)   Body Composition  Body Fat %: 47.8 % Fat Mass (lbs): 115.6 lbs Muscle Mass (lbs): 119.8 lbs Total Body Water (lbs): 86.8 lbs Visceral Fat Rating : 14   Other Clinical Data Fasting: No Labs: No Today's Visit #: 11 Starting Date: 05/12/22     HPI  Chief Complaint: OBESITY  Stacey Carlson is here to discuss her progress with her obesity treatment plan. She is on the the Category 3 Plan and states she is following her eating plan approximately 50 % of the time. She states she is exercising/going to gym working out with weights 30 minutes 3-5 times per week.   Interval History:  Since last office visit she has done well overall with weight loss.  Down another 1 lbs/ Total of 10 lbs.  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 Weight lifting sometimes 3 times a week, other weeks less.  Discussed working on core exercises like planking or gentle yoga to work on core strengthening.   Work on meeting protein needs first to help control hunger, at least 90-100 grams daily and not skipping meals.  Finds it difficult to consistently eat a good dinner as usually very late in evening when finally has time to eat and does not feel like cooking.   Has some celebrations coming up with Easter celebration at church and husband's birthday and discussed celebration strategies to stay on track with weight loss.   Pharmacotherapy: metformin for prediabetes  PHYSICAL EXAM:  Blood pressure 128/79, pulse 64, temperature 98 F (36.7 C), height 5\' 4"  (1.626 m), weight  241 lb (109.3 kg), SpO2 98 %. Body mass index is 41.37 kg/m.  General: She is overweight, cooperative, alert, well developed, and in no acute distress. PSYCH: Has normal mood, affect and thought process.   Lungs: Normal breathing effort, no conversational dyspnea.  DIAGNOSTIC DATA REVIEWED:  BMET    Component Value Date/Time   NA 139 11/08/2022 0848   K 4.3 11/08/2022 0848   CL 103 11/08/2022 0848   CO2 25 11/08/2022 0848   GLUCOSE 98 11/08/2022 0848   GLUCOSE 102 (H) 10/19/2019 0117   BUN 9 11/08/2022 0848   CREATININE 0.57 11/08/2022 0848   CREATININE 0.60 02/16/2012 1642   CALCIUM 9.1 11/08/2022 0848   GFRNONAA >60 11/15/2019 1530   GFRAA >60 11/15/2019 1530   Lab Results  Component Value Date   HGBA1C 6.1 (H) 11/08/2022   HGBA1C 6.0 (H) 05/12/2022   Lab Results  Component Value Date   INSULIN 10.6 11/08/2022   INSULIN 10.8 05/12/2022   Lab Results  Component Value Date   TSH 0.914 05/12/2022   CBC    Component Value Date/Time   WBC 5.3 05/12/2022 0958   WBC 14.1 (H) 11/16/2019 0655   RBC 4.26 05/12/2022 0958   RBC 3.13 (L) 11/16/2019 0655   HGB 12.8 05/12/2022 0958   HCT 38.5 05/12/2022 0958   PLT 284 05/12/2022 0958   MCV 90 05/12/2022 0958   MCH 30.0 05/12/2022 0958   MCH 27.5 11/16/2019 [id number]  MCHC 33.2 05/12/2022 0958   MCHC 32.7 11/16/2019 0655   RDW 12.4 05/12/2022 0958   Iron Studies No results found for: "IRON", "TIBC", "FERRITIN", "IRONPCTSAT" Lipid Panel     Component Value Date/Time   CHOL 202 (H) 11/08/2022 0848   TRIG 77 11/08/2022 0848   HDL 68 11/08/2022 0848   CHOLHDL 2.7 02/16/2012 1642   VLDL 17 02/16/2012 1642   LDLCALC 120 (H) 11/08/2022 0848   Hepatic Function Panel     Component Value Date/Time   PROT 7.0 11/08/2022 0848   ALBUMIN 4.2 11/08/2022 0848   AST 11 11/08/2022 0848   ALT 12 11/08/2022 0848   ALKPHOS 81 11/08/2022 0848   BILITOT 0.3 11/08/2022 0848      Component Value Date/Time   TSH 0.914 05/12/2022  0958   Nutritional Lab Results  Component Value Date   VD25OH 24.8 (L) 11/08/2022   VD25OH 6.9 (L) 05/12/2022    ASSOCIATED CONDITIONS ADDRESSED TODAY  ASSESSMENT AND PLAN  Problem List Items Addressed This Visit     Prediabetes - Primary    Prediabetes Last A1c was 6.1 / Insulin 10.6- not at goal.  Medication(s): Metformin 500 mg once daily breakfast  No side effects with metformin.  Lab Results  Component Value Date   HGBA1C 6.1 (H) 11/08/2022   HGBA1C 6.0 (H) 05/12/2022   Lab Results  Component Value Date   INSULIN 10.6 11/08/2022   INSULIN 10.8 05/12/2022   Plan: Continue and refill Metformin 500 mg once daily breakfast Continue working on nutrition plan to decrease simple carbohydrates, increase lean proteins and exercise to promote weight loss, improve glycemic control and prevent progression to Type 2 diabetes.         Relevant Medications   metFORMIN (GLUCOPHAGE) 500 MG tablet   Vitamin D deficiency    Vitamin D Deficiency Vitamin D is not at goal of 50.  Most recent vitamin D level was 24.8. She is on  prescription ergocalciferol 50,000 IU weekly. Lab Results  Component Value Date   VD25OH 24.8 (L) 11/08/2022   VD25OH 6.9 (L) 05/12/2022   Plan: Refill prescription vitamin D 50,000 IU weekly. Recheck vit D level at least 2-3 times yearly to avoid over supplementation.       Relevant Medications   Vitamin D, Ergocalciferol, (DRISDOL) 1.25 MG (50000 UNIT) CAPS capsule   BMI 40.0-44.9, adult ([facility]), Current BMI 41.7   Relevant Medications   metFORMIN (GLUCOPHAGE) 500 MG tablet   Obesity ([place])- starting BMI-43.08   Relevant Medications   metFORMIN (GLUCOPHAGE) 500 MG tablet      TREATMENT PLAN FOR OBESITY:  Recommended Dietary Goals  Cerita is currently in the action stage of change. As such, her goal is to continue weight management plan. She has agreed to the Category 3 Plan.  Behavioral Intervention  We discussed the following Behavioral  Modification Strategies today: increasing lean protein intake, decreasing simple carbohydrates , avoiding skipping meals, increasing water intake, and work on meal planning and easy cooking plans.  Additional resources provided today: NA  Recommended Physical Activity Goals  Samamtha has been advised to work up to 150 minutes of moderate intensity aerobic activity a week and strengthening exercises 2-3 times per week for cardiovascular health, weight loss maintenance and preservation of muscle mass.   She has agreed to Continue current level of physical activity    Pharmacotherapy We discussed various medication options to help Ravina with her weight loss efforts and we both agreed to continue to  work on Cat 3 nutrition plan to promote weight loss and metformin for prediabetes.    Return in about 4 weeks (around 02/14/2023).[name] She was informed of the importance of frequent follow up visits to maximize her success with intensive lifestyle modifications for her multiple health conditions.   ATTESTASTION STATEMENTS:  Reviewed by clinician on day of visit: allergies, medications, problem list, medical history, surgical history, family history, social history, and previous encounter notes.   I have personally spent 44 minutes total time today in preparation, patient care, nutritional counseling and documentation for this visit, including the following: review of clinical lab tests; review of medical tests/procedures/services.      Oneita Allmon, PA-C

## 2023-01-17 NOTE — Assessment & Plan Note (Signed)
Prediabetes Last A1c was 6.1 / Insulin 10.6- not at goal.  Medication(s): Metformin 500 mg once daily breakfast  No side effects with metformin.  Lab Results  Component Value Date   HGBA1C 6.1 (H) 11/08/2022   HGBA1C 6.0 (H) 05/12/2022   Lab Results  Component Value Date   INSULIN 10.6 11/08/2022   INSULIN 10.8 05/12/2022    Plan: Continue and refill Metformin 500 mg once daily breakfast Continue working on nutrition plan to decrease simple carbohydrates, increase lean proteins and exercise to promote weight loss, improve glycemic control and prevent progression to Type 2 diabetes.

## 2023-01-31 ENCOUNTER — Other Ambulatory Visit (HOSPITAL_COMMUNITY): Payer: Self-pay

## 2023-02-07 ENCOUNTER — Other Ambulatory Visit (HOSPITAL_COMMUNITY): Payer: Self-pay

## 2023-02-07 ENCOUNTER — Other Ambulatory Visit: Payer: Self-pay

## 2023-02-07 MED ORDER — ESCITALOPRAM OXALATE 10 MG PO TABS
10.0000 mg | ORAL_TABLET | Freq: Every day | ORAL | 1 refills | Status: DC
  Filled 2023-02-07: qty 30, 30d supply, fill #0
  Filled 2023-03-22: qty 30, 30d supply, fill #1

## 2023-02-14 ENCOUNTER — Ambulatory Visit (INDEPENDENT_AMBULATORY_CARE_PROVIDER_SITE_OTHER): Payer: 59 | Admitting: Physician Assistant

## 2023-02-14 ENCOUNTER — Other Ambulatory Visit (HOSPITAL_COMMUNITY): Payer: Self-pay

## 2023-02-14 ENCOUNTER — Encounter (INDEPENDENT_AMBULATORY_CARE_PROVIDER_SITE_OTHER): Payer: Self-pay | Admitting: Physician Assistant

## 2023-02-14 VITALS — BP 153/85 | HR 71 | Temp 98.3°F | Ht 64.0 in | Wt 241.0 lb

## 2023-02-14 DIAGNOSIS — E559 Vitamin D deficiency, unspecified: Secondary | ICD-10-CM | POA: Diagnosis not present

## 2023-02-14 DIAGNOSIS — R7303 Prediabetes: Secondary | ICD-10-CM | POA: Diagnosis not present

## 2023-02-14 DIAGNOSIS — Z6841 Body Mass Index (BMI) 40.0 and over, adult: Secondary | ICD-10-CM

## 2023-02-14 DIAGNOSIS — E669 Obesity, unspecified: Secondary | ICD-10-CM

## 2023-02-14 MED ORDER — METFORMIN HCL 500 MG PO TABS
500.0000 mg | ORAL_TABLET | Freq: Two times a day (BID) | ORAL | 3 refills | Status: DC
  Filled 2023-02-14: qty 180, 90d supply, fill #0

## 2023-02-14 MED ORDER — VITAMIN D (ERGOCALCIFEROL) 1.25 MG (50000 UNIT) PO CAPS
50000.0000 [IU] | ORAL_CAPSULE | ORAL | 0 refills | Status: DC
  Filled 2023-02-14: qty 4, 28d supply, fill #0

## 2023-02-14 NOTE — Progress Notes (Signed)
Office: [phone]  /  Fax: [phone]  WEIGHT SUMMARY AND BIOMETRICS  Vitals Temp: 98.3 F (36.8 C) BP: (!) 153/85 Pulse Rate: 71 SpO2: 96 %   Anthropometric Measurements Height:  (1.626 m) Weight: 241 lb (109.3 kg) BMI (Calculated): 41.35 Weight at Last Visit: 241 lb Weight Lost Since Last Visit: 0 lb Weight Gained Since Last Visit: 0 lb Starting Weight: 251 lb Total Weight Loss (lbs): 10 lb (4.536 kg)   Body Composition  Body Fat %: 47.7 % Fat Mass (lbs): 115 lbs Muscle Mass (lbs): 119.8 lbs Total Body Water (lbs): 86.6 lbs Visceral Fat Rating : 14   Other Clinical Data Fasting: No Labs: No Today's Visit #: 12 Starting Date: 05/12/22     HPI  Chief Complaint: OBESITY  Stacey Carlson is here to discuss her progress with her obesity treatment plan. She is on the the Category 3 Plan and states she is following her eating plan approximately 80 % of the time. She states she is exercising/walking 30-60 minutes 3 times per week.   Interval History:  Since last office visit she has maintained.  She is struggling with consistency with meal planning and prepping.  Breakfast- cafeteria bacon and potatoes Lunch- [employer] drink Dinner- meat and vegetables Exercising- walking more frequently.  Hunger/appetite- moderate control, not hungry during the day after eats breakfast and discussed the importance of not skipping meals and getting adequate protein.  We also discussed switching to low carbohydrate plan to kick start weight loss over the next few weeks and she is going to look over and proceed if ready.    Pharmacotherapy: metformin 500 mg once daily for prediabetes. No side effects with metformin.   PHYSICAL EXAM:  Blood pressure (!) 153/85, pulse 71, temperature 98.3 F (36.8 C), height  (1.626 m), weight 241 lb (109.3 kg), SpO2 96 %. Body mass index is 41.37 kg/m.  General: She is overweight, cooperative, alert, well developed, and in no acute  distress. PSYCH: Has normal mood, affect and thought process.   Cardiovascular: BP up today,regular rhythm, HR 70's Lungs: Normal breathing effort, no conversational dyspnea. Neuro; no focal deficits  DIAGNOSTIC DATA REVIEWED:  BMET    Component Value Date/Time   NA 139 11/08/2022 0848   K 4.3 11/08/2022 0848   CL 103 11/08/2022 0848   CO2 25 11/08/2022 0848   GLUCOSE 98 11/08/2022 0848   GLUCOSE 102 (H) 10/19/2019 0117   BUN 9 11/08/2022 0848   CREATININE 0.57 11/08/2022 0848   CREATININE 0.60 02/16/2012 1642   CALCIUM 9.1 11/08/2022 0848   GFRNONAA >60 11/15/2019 1530   GFRAA >60 11/15/2019 1530   Lab Results  Component Value Date   HGBA1C 6.1 (H) 11/08/2022   HGBA1C 6.0 (H) 05/12/2022   Lab Results  Component Value Date   INSULIN 10.6 11/08/2022   INSULIN 10.8 05/12/2022   Lab Results  Component Value Date   TSH 0.914 05/12/2022   CBC    Component Value Date/Time   WBC 5.3 05/12/2022 0958   WBC 14.1 (H) 11/16/2019 0655   RBC 4.26 05/12/2022 0958   RBC 3.13 (L) 11/16/2019 0655   HGB 12.8 05/12/2022 0958   HCT 38.5 05/12/2022 0958   PLT 284 05/12/2022 0958   MCV 90 05/12/2022 0958   MCH 30.0 05/12/2022 0958   MCH 27.5 11/16/2019 0655   MCHC 33.2 05/12/2022 0958   MCHC 32.7 11/16/2019 0655   RDW 12.4 05/12/2022 0958   Iron Studies No results found  for: "IRON", "TIBC", "FERRITIN", "IRONPCTSAT" Lipid Panel     Component Value Date/Time   CHOL 202 (H) 11/08/2022 0848   TRIG 77 11/08/2022 0848   HDL 68 11/08/2022 0848   CHOLHDL 2.7 02/16/2012 1642   VLDL 17 02/16/2012 1642   LDLCALC 120 (H) 11/08/2022 0848   Hepatic Function Panel     Component Value Date/Time   PROT 7.0 11/08/2022 0848   ALBUMIN 4.2 11/08/2022 0848   AST 11 11/08/2022 0848   ALT 12 11/08/2022 0848   ALKPHOS 81 11/08/2022 0848   BILITOT 0.3 11/08/2022 0848      Component Value Date/Time   TSH 0.914 05/12/2022 0958   Nutritional Lab Results  Component Value Date   VD25OH  24.8 (L) 11/08/2022   VD25OH 6.9 (L) 05/12/2022    ASSOCIATED CONDITIONS ADDRESSED TODAY  ASSESSMENT AND PLAN  Problem List Items Addressed This Visit     Prediabetes - Primary    Prediabetes Last A1c was 6.1/ Insulin 10.6- not at goals.   Medication(s): Metformin 500 mg once daily breakfast No side effects with metformin.  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   Lab Results  Component Value Date   HGBA1C 6.1 (H) 11/08/2022   HGBA1C 6.0 (H) 05/12/2022   Lab Results  Component Value Date   INSULIN 10.6 11/08/2022   INSULIN 10.8 05/12/2022   Plan: Continue and increase dose Refill Metformin 500 mg twice daily with meals Continue working on nutrition plan to decrease simple carbohydrates, increase lean proteins and exercise to promote weight loss, improve glycemic control and prevent progression to Type 2 diabetes.         Relevant Medications   metFORMIN (GLUCOPHAGE) 500 MG tablet   Vitamin D deficiency    Vitamin D Deficiency Vitamin D is not at goal of 50.  Most recent vitamin D level was 24.8. She is on  prescription ergocalciferol 50,000 IU weekly. Lab Results  Component Value Date   VD25OH 24.8 (L) 11/08/2022   VD25OH 6.9 (L) 05/12/2022   Plan: Continue and refill  prescription ergocalciferol 50,000 IU weekly Low vitamin D levels can be associated with adiposity and may result in leptin resistance and weight gain. Also associated with fatigue. Currently on vitamin D supplementation without any adverse effects.         Relevant Medications   Vitamin D, Ergocalciferol, (DRISDOL) 1.25 MG (50000 UNIT) CAPS capsule   BMI 40.0-44.9, adult (HCC), Current BMI 41.7   Relevant Medications   metFORMIN (GLUCOPHAGE) 500 MG tablet   Obesity (HCC)- starting BMI-43.08   Relevant Medications   metFORMIN (GLUCOPHAGE)  500 MG tablet      TREATMENT PLAN FOR OBESITY:  Recommended Dietary Goals  Stacey Carlson is currently in the action stage of change. As such, her goal is to continue weight management plan. She has agreed to the Category 3 Plan.or Low carbohydrate plan as discussed today.   Behavioral Intervention  We discussed the following Behavioral Modification Strategies today: increasing lean protein intake, decreasing simple carbohydrates , increasing vegetables, increasing fiber rich foods, avoiding skipping meals, increasing water intake, work on meal planning and preparation, and planning for success.  Additional resources provided today: NA  Recommended Physical Activity Goals  Stacey Carlson has been advised to work up to 150 minutes of moderate intensity aerobic activity a week and strengthening exercises 2-3 times per week for  cardiovascular health, weight loss maintenance and preservation of muscle mass.   She has agreed to Continue current level of physical activity    Pharmacotherapy We discussed various medication options to help Stacey Carlson with her weight loss efforts and we both agreed to increase metformin to 500 mg twice daily for prediabetes and continue to work on nutritional and behavioral strategies to promote weight loss.    Return in about 4 weeks (around 03/14/2023).[name] She was informed of the importance of frequent follow up visits to maximize her success with intensive lifestyle modifications for her multiple health conditions.   ATTESTASTION STATEMENTS:  Reviewed by clinician on day of visit: allergies, medications, problem list, medical history, surgical history, family history, social history, and previous encounter notes.   I have personally spent 41 minutes total time today in preparation, patient care, nutritional counseling and documentation for this visit, including the following: review of clinical lab tests; review of medical tests/procedures/services.      Stacey Skipper,  PA-C

## 2023-02-14 NOTE — Assessment & Plan Note (Signed)
Prediabetes Last A1c was 6.1/ Insulin 10.6- not at goals.   Medication(s): Metformin 500 mg once daily breakfast No side effects with metformin.  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   Lab Results  Component Value Date   HGBA1C 6.1 (H) 11/08/2022   HGBA1C 6.0 (H) 05/12/2022   Lab Results  Component Value Date   INSULIN 10.6 11/08/2022   INSULIN 10.8 05/12/2022    Plan: Continue and increase dose Refill Metformin 500 mg twice daily with meals Continue working on nutrition plan to decrease simple carbohydrates, increase lean proteins and exercise to promote weight loss, improve glycemic control and prevent progression to Type 2 diabetes.

## 2023-02-14 NOTE — Assessment & Plan Note (Signed)
Vitamin D Deficiency Vitamin D is not at goal of 50.  Most recent vitamin D level was 24.8. She is on  prescription ergocalciferol 50,000 IU weekly. Lab Results  Component Value Date   VD25OH 24.8 (L) 11/08/2022   VD25OH 6.9 (L) 05/12/2022    Plan: Continue and refill  prescription ergocalciferol 50,000 IU weekly Low vitamin D levels can be associated with adiposity and may result in leptin resistance and weight gain. Also associated with fatigue. Currently on vitamin D supplementation without any adverse effects.

## 2023-02-15 DIAGNOSIS — Z1151 Encounter for screening for human papillomavirus (HPV): Secondary | ICD-10-CM | POA: Diagnosis not present

## 2023-02-15 DIAGNOSIS — Z1231 Encounter for screening mammogram for malignant neoplasm of breast: Secondary | ICD-10-CM | POA: Diagnosis not present

## 2023-02-15 DIAGNOSIS — Z124 Encounter for screening for malignant neoplasm of cervix: Secondary | ICD-10-CM | POA: Diagnosis not present

## 2023-02-15 DIAGNOSIS — Z130 Encounter for screening for diseases of the blood and blood-forming organs and certain disorders involving the immune mechanism: Secondary | ICD-10-CM | POA: Diagnosis not present

## 2023-02-15 DIAGNOSIS — Z1211 Encounter for screening for malignant neoplasm of colon: Secondary | ICD-10-CM | POA: Diagnosis not present

## 2023-02-15 DIAGNOSIS — Z1389 Encounter for screening for other disorder: Secondary | ICD-10-CM | POA: Diagnosis not present

## 2023-02-15 DIAGNOSIS — Z01419 Encounter for gynecological examination (general) (routine) without abnormal findings: Secondary | ICD-10-CM | POA: Diagnosis not present

## 2023-02-20 ENCOUNTER — Other Ambulatory Visit: Payer: Self-pay | Admitting: Obstetrics and Gynecology

## 2023-02-20 DIAGNOSIS — R928 Other abnormal and inconclusive findings on diagnostic imaging of breast: Secondary | ICD-10-CM

## 2023-02-23 DIAGNOSIS — Z1211 Encounter for screening for malignant neoplasm of colon: Secondary | ICD-10-CM | POA: Diagnosis not present

## 2023-02-23 DIAGNOSIS — R194 Change in bowel habit: Secondary | ICD-10-CM | POA: Diagnosis not present

## 2023-02-23 DIAGNOSIS — F419 Anxiety disorder, unspecified: Secondary | ICD-10-CM | POA: Diagnosis not present

## 2023-02-23 DIAGNOSIS — F32A Depression, unspecified: Secondary | ICD-10-CM | POA: Diagnosis not present

## 2023-02-27 ENCOUNTER — Other Ambulatory Visit: Payer: 59

## 2023-03-14 ENCOUNTER — Other Ambulatory Visit (HOSPITAL_COMMUNITY): Payer: Self-pay

## 2023-03-14 ENCOUNTER — Encounter (INDEPENDENT_AMBULATORY_CARE_PROVIDER_SITE_OTHER): Payer: Self-pay | Admitting: Physician Assistant

## 2023-03-14 ENCOUNTER — Ambulatory Visit
Admission: RE | Admit: 2023-03-14 | Discharge: 2023-03-14 | Disposition: A | Discharge status: Home / Self Care | Payer: 59 | Source: Ambulatory Visit | Attending: Obstetrics and Gynecology | Admitting: Obstetrics and Gynecology

## 2023-03-14 ENCOUNTER — Ambulatory Visit (INDEPENDENT_AMBULATORY_CARE_PROVIDER_SITE_OTHER): Payer: 59 | Admitting: Physician Assistant

## 2023-03-14 ENCOUNTER — Other Ambulatory Visit: Payer: Self-pay | Admitting: Obstetrics and Gynecology

## 2023-03-14 VITALS — BP 135/80 | HR 59 | Temp 97.9°F | Ht 64.0 in | Wt 240.0 lb

## 2023-03-14 DIAGNOSIS — R7303 Prediabetes: Secondary | ICD-10-CM

## 2023-03-14 DIAGNOSIS — Z6841 Body Mass Index (BMI) 40.0 and over, adult: Secondary | ICD-10-CM

## 2023-03-14 DIAGNOSIS — N63 Unspecified lump in unspecified breast: Secondary | ICD-10-CM | POA: Diagnosis not present

## 2023-03-14 DIAGNOSIS — R92332 Mammographic heterogeneous density, left breast: Secondary | ICD-10-CM | POA: Diagnosis not present

## 2023-03-14 DIAGNOSIS — E559 Vitamin D deficiency, unspecified: Secondary | ICD-10-CM

## 2023-03-14 DIAGNOSIS — R922 Inconclusive mammogram: Secondary | ICD-10-CM | POA: Diagnosis not present

## 2023-03-14 DIAGNOSIS — R928 Other abnormal and inconclusive findings on diagnostic imaging of breast: Secondary | ICD-10-CM

## 2023-03-14 DIAGNOSIS — E669 Obesity, unspecified: Secondary | ICD-10-CM

## 2023-03-14 MED ORDER — VITAMIN D (ERGOCALCIFEROL) 1.25 MG (50000 UNIT) PO CAPS
50000.0000 [IU] | ORAL_CAPSULE | ORAL | 0 refills | Status: DC
Start: 2023-03-14 — End: 2023-04-11
  Filled 2023-03-14: qty 4, 28d supply, fill #0

## 2023-03-14 MED ORDER — METFORMIN HCL 500 MG PO TABS
500.0000 mg | ORAL_TABLET | Freq: Two times a day (BID) | ORAL | 3 refills | Status: DC
Start: 2023-03-14 — End: 2023-07-12
  Filled 2023-03-14: qty 180, 90d supply, fill #0

## 2023-03-14 NOTE — Progress Notes (Signed)
.[facility]  Office: [phone]  /  Fax: [phone]  WEIGHT SUMMARY AND BIOMETRICS  Vitals Temp: 97.9 F (36.6 C) BP: 135/80 Pulse Rate: (!) 59 SpO2: 100 %   Anthropometric Measurements Height: 5\' 4"  (1.626 m) Weight: 240 lb (108.9 kg) BMI (Calculated): 41.18 Weight at Last Visit: 241lb Weight Lost Since Last Visit: 1lb Weight Gained Since Last Visit: 0 Starting Weight: 251lb Total Weight Loss (lbs): 11 lb (4.99 kg)   Body Composition  Body Fat %: 48 % Fat Mass (lbs): 115.2 lbs Muscle Mass (lbs): 118.4 lbs Total Body Water (lbs): 85 lbs Visceral Fat Rating : 14   Other Clinical Data Fasting: no Labs: no Today's Visit #: 13 Starting Date: 05/12/22     HPI  Chief Complaint: OBESITY  Stacey Carlson is here to discuss her progress with her obesity treatment plan. She is on the the Category 3 Plan and states she is following her eating plan approximately 70 % of the time. She states she is exercising by walking 30 minutes 3 to 5 times per week.   Interval History:  Since last office visit she down 1 lb.  Total of 11 lbs. / TBW loss of 4.4% Hunger/appetite-moderate control Cravings- not excessive, does indulge in sweets at times Stress- some increase in work stress due to short staff.  Exercise-limited due to knee pain after tried to increase activity dramatically after last visit. Hx of knee surgery.  Walking at work, no consistent activities and discussed trying to work on wall pilates or yoga thru you tube as having difficulty making it to gym/keeping activities lower impact.  Hydration-increased to 80-100 oz per GI instructions to increase water intake.    Pharmacotherapy: metformin for primary indication of prediabetes. No GI side effects with metformin.   TREATMENT PLAN FOR OBESITY:  Recommended Dietary Goals  Cella is currently in the action stage of change. As such, her goal is to continue weight management plan. She has agreed to the Category 3 Plan and  practicing portion control and making smarter food choices, such as increasing vegetables and decreasing simple carbohydrates.  Behavioral Intervention  We discussed the following Behavioral Modification Strategies today: increasing lean protein intake, decreasing simple carbohydrates , increasing vegetables, increasing lower glycemic fruits, increasing fiber rich foods, increasing water intake, work on managing stress, creating time for self-care and relaxation measures, continue to practice mindfulness when eating, and planning for success.  Additional resources provided today: NA  Recommended Physical Activity Goals  [name] has been advised to work up to 150 minutes of moderate intensity aerobic activity a week and strengthening exercises 2-3 times per week for cardiovascular health, weight loss maintenance and preservation of muscle mass.   She has agreed to Think about ways to increase daily physical activity and overcoming barriers to exercise and Work on scheduling and tracking physical activity.    Pharmacotherapy We discussed various medication options to help [name] with her weight loss efforts and we both agreed to continue metformin for primary indication of prediabetes.  She was informed we would discuss her lab results at her next visit unless there is a critical issue that needs to be addressed sooner. She agreed to keep her next visit at the agreed upon time to discuss these results.     Return in about 4 weeks (around 04/11/2023).[name] She was informed of the importance of frequent follow up visits to maximize her success with intensive lifestyle modifications for her multiple health conditions.  PHYSICAL EXAM:  Blood pressure 135/80, pulse [name])  59, temperature 97.9 F (36.6 C), height 5\' 4"  (1.626 m), weight 240 lb (108.9 kg), SpO2 100 %. Body mass index is 41.2 kg/m.  General: She is overweight, cooperative, alert, well developed, and in no acute distress. PSYCH: Has normal  mood, affect and thought process.   Cardiovascular: HR 60's regular Lungs: Normal breathing effort, no conversational dyspnea. Neuro: no focal deficits  DIAGNOSTIC DATA REVIEWED:  BMET    Component Value Date/Time   NA 139 11/08/2022 0848   K 4.3 11/08/2022 0848   CL 103 11/08/2022 0848   CO2 25 11/08/2022 0848   GLUCOSE 98 11/08/2022 0848   GLUCOSE 102 (H) 10/19/2019 0117   BUN 9 11/08/2022 0848   CREATININE 0.57 11/08/2022 0848   CREATININE 0.60 02/16/2012 1642   CALCIUM 9.1 11/08/2022 0848   GFRNONAA >60 11/15/2019 1530   GFRAA >60 11/15/2019 1530   Lab Results  Component Value Date   HGBA1C 6.1 (H) 11/08/2022   HGBA1C 6.0 (H) 05/12/2022   Lab Results  Component Value Date   INSULIN 10.6 11/08/2022   INSULIN 10.8 05/12/2022   Lab Results  Component Value Date   TSH 0.914 05/12/2022   CBC    Component Value Date/Time   WBC 5.3 05/12/2022 0958   WBC 14.1 (H) 11/16/2019 0655   RBC 4.26 05/12/2022 0958   RBC 3.13 (L) 11/16/2019 0655   HGB 12.8 05/12/2022 0958   HCT 38.5 05/12/2022 0958   PLT 284 05/12/2022 0958   MCV 90 05/12/2022 0958   MCH 30.0 05/12/2022 0958   MCH 27.5 11/16/2019 0655   MCHC 33.2 05/12/2022 0958   MCHC 32.7 11/16/2019 0655   RDW 12.4 05/12/2022 0958   Iron Studies No results found for: "IRON", "TIBC", "FERRITIN", "IRONPCTSAT" Lipid Panel     Component Value Date/Time   CHOL 202 (H) 11/08/2022 0848   TRIG 77 11/08/2022 0848   HDL 68 11/08/2022 0848   CHOLHDL 2.7 02/16/2012 1642   VLDL 17 02/16/2012 1642   LDLCALC 120 (H) 11/08/2022 0848   Hepatic Function Panel     Component Value Date/Time   PROT 7.0 11/08/2022 0848   ALBUMIN 4.2 11/08/2022 0848   AST 11 11/08/2022 0848   ALT 12 11/08/2022 0848   ALKPHOS 81 11/08/2022 0848   BILITOT 0.3 11/08/2022 0848      Component Value Date/Time   TSH 0.914 05/12/2022 0958   Nutritional Lab Results  Component Value Date   VD25OH 24.8 (L) 11/08/2022   VD25OH 6.9 (L) 05/12/2022     ASSOCIATED CONDITIONS ADDRESSED TODAY  ASSESSMENT AND PLAN  Problem List Items Addressed This Visit     Prediabetes - Primary   Relevant Medications   metFORMIN (GLUCOPHAGE) 500 MG tablet   Other Relevant Orders   CMP14+EGFR   Hemoglobin A1c   Vitamin D deficiency   Relevant Medications   Vitamin D, Ergocalciferol, (DRISDOL) 1.25 MG (50000 UNIT) CAPS capsule   Other Relevant Orders   VITAMIN D 25 Hydroxy (Vit-D Deficiency, Fractures)   BMI 40.0-44.9, adult (HCC), Current BMI 41.7   Relevant Medications   metFORMIN (GLUCOPHAGE) 500 MG tablet   Obesity (HCC)- starting BMI-43.08   Relevant Medications   metFORMIN (GLUCOPHAGE) 500 MG tablet   Prediabetes Last A1c was 6.1/ Insulin 10.6- not at goals.   Medication(s): Metformin 500 mg twice daily with meals No GI side or other side effects.  Lab Results  Component Value Date   HGBA1C 6.1 (H) 11/08/2022   HGBA1C 6.0 (H)  05/12/2022   Lab Results  Component Value Date   INSULIN 10.6 11/08/2022   INSULIN 10.8 05/12/2022    Plan: Continue and refill Metformin 500 mg twice daily with meals Continue working on nutrition plan to decrease simple carbohydrates, increase lean proteins and exercise to promote weight loss, improve glycemic control and prevent progression to Type 2 diabetes.  Recheck A1C and CMET  level today.    Vitamin D Deficiency Vitamin D is not at goal of 50.  Most recent vitamin D level was 24.8. She is on  prescription ergocalciferol 50,000 IU weekly. No N/V or muscle weakness or other side effects with Ergocalciferol.  Lab Results  Component Value Date   VD25OH 24.8 (L) 11/08/2022   VD25OH 6.9 (L) 05/12/2022    Plan: Continue and refill  prescription ergocalciferol 50,000 IU weekly Recheck Vitamin D level today.  Low vitamin D levels can be associated with adiposity and may result in leptin resistance and weight gain. Also associated with fatigue. Currently on vitamin D supplementation without any  adverse effects.     ATTESTASTION STATEMENTS:  Reviewed by clinician on day of visit: allergies, medications, problem list, medical history, surgical history, family history, social history, and previous encounter notes.   I have personally spent 40 minutes total time today in preparation, patient care, nutritional counseling and documentation for this visit, including the following: review of clinical lab tests; review of medical tests/procedures/services.      Theodoro Koval, PA-C

## 2023-03-15 LAB — CMP14+EGFR
ALT: 13 IU/L (ref 0–32)
AST: 16 IU/L (ref 0–40)
Albumin/Globulin Ratio: 1.7 (ref 1.2–2.2)
Albumin: 4.7 g/dL (ref 3.9–4.9)
Alkaline Phosphatase: 98 IU/L (ref 44–121)
BUN/Creatinine Ratio: 10 (ref 9–23)
BUN: 7 mg/dL (ref 6–24)
Bilirubin Total: 0.3 mg/dL (ref 0.0–1.2)
CO2: 24 mmol/L (ref 20–29)
Calcium: 9.8 mg/dL (ref 8.7–10.2)
Chloride: 104 mmol/L (ref 96–106)
Creatinine, Ser: 0.71 mg/dL (ref 0.57–1.00)
Globulin, Total: 2.8 g/dL (ref 1.5–4.5)
Glucose: 92 mg/dL (ref 70–99)
Potassium: 4.8 mmol/L (ref 3.5–5.2)
Sodium: 143 mmol/L (ref 134–144)
Total Protein: 7.5 g/dL (ref 6.0–8.5)
eGFR: 105 mL/min/{1.73_m2} (ref >59)

## 2023-03-15 LAB — HEMOGLOBIN A1C
Est. average glucose Bld gHb Est-mCnc: 128 mg/dL
Hgb A1c MFr Bld: 6.1 % — ABNORMAL HIGH (ref 4.8–5.6)

## 2023-03-15 LAB — VITAMIN D 25 HYDROXY (VIT D DEFICIENCY, FRACTURES): Vit D, 25-Hydroxy: 36.8 ng/mL (ref 30.0–100.0)

## 2023-03-17 ENCOUNTER — Ambulatory Visit
Admission: RE | Admit: 2023-03-17 | Discharge: 2023-03-17 | Disposition: A | Discharge status: Home / Self Care | Payer: 59 | Source: Ambulatory Visit | Attending: Obstetrics and Gynecology | Admitting: Obstetrics and Gynecology

## 2023-03-17 ENCOUNTER — Other Ambulatory Visit: Payer: Self-pay | Admitting: Obstetrics and Gynecology

## 2023-03-17 DIAGNOSIS — R928 Other abnormal and inconclusive findings on diagnostic imaging of breast: Secondary | ICD-10-CM

## 2023-03-17 DIAGNOSIS — N63 Unspecified lump in unspecified breast: Secondary | ICD-10-CM | POA: Diagnosis not present

## 2023-03-17 HISTORY — PX: BREAST BIOPSY: SHX20

## 2023-03-22 ENCOUNTER — Other Ambulatory Visit: Payer: Self-pay | Admitting: Obstetrics and Gynecology

## 2023-03-22 DIAGNOSIS — Z1231 Encounter for screening mammogram for malignant neoplasm of breast: Secondary | ICD-10-CM

## 2023-03-24 ENCOUNTER — Other Ambulatory Visit (HOSPITAL_COMMUNITY): Payer: Self-pay

## 2023-03-24 MED ORDER — GOLYTELY 236 G PO SOLR
ORAL | 0 refills | Status: AC
  Filled 2023-03-24: qty 4000, 1d supply, fill #0

## 2023-03-30 ENCOUNTER — Other Ambulatory Visit (HOSPITAL_COMMUNITY): Payer: Self-pay

## 2023-04-05 DIAGNOSIS — F419 Anxiety disorder, unspecified: Secondary | ICD-10-CM | POA: Diagnosis not present

## 2023-04-05 DIAGNOSIS — Z1211 Encounter for screening for malignant neoplasm of colon: Secondary | ICD-10-CM | POA: Diagnosis not present

## 2023-04-06 ENCOUNTER — Other Ambulatory Visit (HOSPITAL_COMMUNITY): Payer: Self-pay

## 2023-04-06 MED ORDER — ESCITALOPRAM OXALATE 10 MG PO TABS
10.0000 mg | ORAL_TABLET | Freq: Every day | ORAL | 2 refills | Status: DC
  Filled 2023-04-06 – 2023-05-07 (×2): qty 30, 30d supply, fill #0
  Filled 2023-06-15: qty 30, 30d supply, fill #1
  Filled 2023-08-14: qty 30, 30d supply, fill #2

## 2023-04-11 ENCOUNTER — Other Ambulatory Visit (HOSPITAL_COMMUNITY): Payer: Self-pay

## 2023-04-11 ENCOUNTER — Ambulatory Visit (INDEPENDENT_AMBULATORY_CARE_PROVIDER_SITE_OTHER): Payer: 59 | Admitting: Physician Assistant

## 2023-04-11 ENCOUNTER — Encounter (INDEPENDENT_AMBULATORY_CARE_PROVIDER_SITE_OTHER): Payer: Self-pay | Admitting: Physician Assistant

## 2023-04-11 VITALS — BP 142/84 | HR 71 | Temp 97.8°F | Ht 64.0 in | Wt 243.0 lb

## 2023-04-11 DIAGNOSIS — E669 Obesity, unspecified: Secondary | ICD-10-CM

## 2023-04-11 DIAGNOSIS — R7303 Prediabetes: Secondary | ICD-10-CM | POA: Diagnosis not present

## 2023-04-11 DIAGNOSIS — E559 Vitamin D deficiency, unspecified: Secondary | ICD-10-CM

## 2023-04-11 DIAGNOSIS — Z6841 Body Mass Index (BMI) 40.0 and over, adult: Secondary | ICD-10-CM

## 2023-04-11 MED ORDER — VITAMIN D (ERGOCALCIFEROL) 1.25 MG (50000 UNIT) PO CAPS
50000.0000 [IU] | ORAL_CAPSULE | ORAL | 0 refills | Status: DC
Start: 2023-04-11 — End: 2023-07-12
  Filled 2023-04-11 – 2023-05-07 (×2): qty 4, 28d supply, fill #0

## 2023-04-11 NOTE — Progress Notes (Signed)
.smr  Office: [phone]  /  Fax: [phone]  WEIGHT SUMMARY AND BIOMETRICS  Vitals Temp: 97.8 F (36.6 C) BP: (!) 142/84 Pulse Rate: 71 SpO2: 98 %   Anthropometric Measurements Height: 5\' 4"  (1.626 m) Weight: 243 lb (110.2 kg) BMI (Calculated): 41.69 Weight at Last Visit: 240 lb Weight Gained Since Last Visit: 3 lb Starting Weight: 251 lb   Body Composition  Body Fat %: 47.8 % Fat Mass (lbs): 116.2 lbs Muscle Mass (lbs): 120.4 lbs Total Body Water (lbs): 85.6 lbs Visceral Fat Rating : 14   Other Clinical Data Fasting: No Labs: No Today's Visit #: 14 Starting Date: 05/12/22     HPI  Chief Complaint: OBESITY  Sahar is here to discuss her progress with her obesity treatment plan. She is on the the Category 3 Plan and states she is following her eating plan approximately 80 % of the time. She states she is exercising walking 20-30 minutes 2-3 times per week.   Interval History:  Since last office visit she up 3 lbs.  Some celebration eating as went to niece's graduation in [place] .  Working to get back on track with plan. Has difficulty not wasting food.  Eating cinnamon rolls sister in law made.   Hunger/appetite-reports increased hunger signals since starting metformin.  Muscle mass is up 2 lbs, adipose up 1 lb.  Cravings- denies excessive cravings.  Struggling some with meal plan and prepping, and making snacking choices.  Provided 100 calorie protein snack hand out .  Stress- work stress remains high Exercise-trying to increase walking with daughter on regular basis at least 1 mile 2-3 times weekly.    Pharmacotherapy: Metformin 500 mg twice daily for prediabetes. No GI side effects with metformin.   TREATMENT PLAN FOR OBESITY:  Recommended Dietary Goals  Sheniah is currently in the action stage of change. As such, her goal is to continue weight management plan. She has agreed to the Category 3 Plan.  Behavioral Intervention  We  discussed the following Behavioral Modification Strategies today: increasing lean protein intake, decreasing simple carbohydrates , increasing vegetables, increasing lower glycemic fruits, increasing fiber rich foods, avoiding skipping meals, increasing water intake, work on meal planning and preparation, continue to practice mindfulness when eating, and planning for success.  Additional resources provided today: NA  Recommended Physical Activity Goals  Annalise has been advised to work up to 150 minutes of moderate intensity aerobic activity a week and strengthening exercises 2-3 times per week for cardiovascular health, weight loss maintenance and preservation of muscle mass.   She has agreed to Continue current level of physical activity  and Think about ways to increase daily physical activity and overcoming barriers to exercise   Pharmacotherapy We discussed various medication options to help Klair with her weight loss efforts and we both agreed to continue metformin for primary indication of prediabetes.    Return in about 4 weeks (around 05/09/2023).[name] She was informed of the importance of frequent follow up visits to maximize her success with intensive lifestyle modifications for her multiple health conditions.  PHYSICAL EXAM:  Blood pressure (!) 142/84, pulse 71, temperature 97.8 F (36.6 C), height 5\' 4"  (1.626 m), weight 243 lb (110.2 kg), SpO2 98 %. Body mass index is 41.71 kg/m.  General: She is overweight, cooperative, alert, well developed, and in no acute distress. PSYCH: Has normal mood, affect and thought process.   Cardiovascular: HR 70's regular, BP 142/84 Lungs: Normal breathing effort, no conversational dyspnea. Neuro: no focal  deficits.   DIAGNOSTIC DATA REVIEWED:  BMET    Component Value Date/Time   NA 143 03/14/2023 1244   K 4.8 03/14/2023 1244   CL 104 03/14/2023 1244   CO2 24 03/14/2023 1244   GLUCOSE 92 03/14/2023 1244   GLUCOSE 102 (H) 10/19/2019 0117    BUN 7 03/14/2023 1244   CREATININE 0.71 03/14/2023 1244   CREATININE 0.60 02/16/2012 1642   CALCIUM 9.8 03/14/2023 1244   GFRNONAA >60 11/15/2019 1530   GFRAA >60 11/15/2019 1530   Lab Results  Component Value Date   HGBA1C 6.1 (H) 03/14/2023   HGBA1C 6.0 (H) 05/12/2022   Lab Results  Component Value Date   INSULIN 10.6 11/08/2022   INSULIN 10.8 05/12/2022   Lab Results  Component Value Date   TSH 0.914 05/12/2022   CBC    Component Value Date/Time   WBC 5.3 05/12/2022 0958   WBC 14.1 (H) 11/16/2019 0655   RBC 4.26 05/12/2022 0958   RBC 3.13 (L) 11/16/2019 0655   HGB 12.8 05/12/2022 0958   HCT 38.5 05/12/2022 0958   PLT 284 05/12/2022 0958   MCV 90 05/12/2022 0958   MCH 30.0 05/12/2022 0958   MCH 27.5 11/16/2019 0655   MCHC 33.2 05/12/2022 0958   MCHC 32.7 11/16/2019 0655   RDW 12.4 05/12/2022 0958   Iron Studies No results found for: "IRON", "TIBC", "FERRITIN", "IRONPCTSAT" Lipid Panel     Component Value Date/Time   CHOL 202 (H) 11/08/2022 0848   TRIG 77 11/08/2022 0848   HDL 68 11/08/2022 0848   CHOLHDL 2.7 02/16/2012 1642   VLDL 17 02/16/2012 1642   LDLCALC 120 (H) 11/08/2022 0848   Hepatic Function Panel     Component Value Date/Time   PROT 7.5 03/14/2023 1244   ALBUMIN 4.7 03/14/2023 1244   AST 16 03/14/2023 1244   ALT 13 03/14/2023 1244   ALKPHOS 98 03/14/2023 1244   BILITOT 0.3 03/14/2023 1244      Component Value Date/Time   TSH 0.914 05/12/2022 0958   Nutritional Lab Results  Component Value Date   VD25OH 36.8 03/14/2023   VD25OH 24.8 (L) 11/08/2022   VD25OH 6.9 (L) 05/12/2022    ASSOCIATED CONDITIONS ADDRESSED TODAY  ASSESSMENT AND PLAN  Problem List Items Addressed This Visit     Prediabetes - Primary   Vitamin D deficiency   Relevant Medications   Vitamin D, Ergocalciferol, (DRISDOL) 1.25 MG (50000 UNIT) CAPS capsule   BMI 40.0-44.9, adult (HCC), Current BMI 41.7   Obesity (HCC)- starting BMI-43.08  Prediabetes Labs  were reviewed today and discussed with the patient.  Last A1c was 6.1- unchanged.   Medication(s): Metformin 500 mg twice daily with meals Polyphagia:No but some cravings for sweets lately.  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   Lab Results  Component Value Date   HGBA1C 6.1 (H) 03/14/2023   HGBA1C 6.1 (H) 11/08/2022   HGBA1C 6.0 (H) 05/12/2022   Lab Results  Component Value Date   INSULIN 10.6 11/08/2022   INSULIN 10.8 05/12/2022    Plan: Continue Metformin 500 mg twice daily with meals No refills needed this visit.  Continue working on nutrition plan to decrease simple carbohydrates, increase lean proteins and exercise to promote weight loss, improve glycemic control and prevent progression to Type 2 diabetes. Discussed working on increasing fiber and water. At least  3 servings of fruit or vegetables daily.   Vitamin D Deficiency Labs were reviewed today and discussed with the patient.  Vitamin D is not at goal of 50.  Most recent vitamin D level was 36.8- improving, but not at goal of 50-70. She is on  prescription ergocalciferol 50,000 IU weekly. Lab Results  Component Value Date   VD25OH 36.8 03/14/2023   VD25OH 24.8 (L) 11/08/2022   VD25OH 6.9 (L) 05/12/2022    Plan: Continue and refill  prescription ergocalciferol 50,000 IU weekly Low vitamin D levels can be associated with adiposity and may result in leptin resistance and weight gain. Also associated with fatigue. Currently on vitamin D supplementation without any adverse effects.  Recheck vitamin D levels 3-4 times yearly to optimize supplementation.    ATTESTASTION STATEMENTS:  Reviewed by clinician on day of visit: allergies, medications, problem list, medical history, surgical history, family history, social history, and previous encounter notes.   I have  personally spent 45 minutes total time today in preparation, patient care, nutritional counseling and documentation for this visit, including the following: review of clinical lab tests; review of medical tests/procedures/services.      Janeth Terry, PA-C

## 2023-04-18 DIAGNOSIS — F411 Generalized anxiety disorder: Secondary | ICD-10-CM | POA: Diagnosis not present

## 2023-04-25 DIAGNOSIS — F411 Generalized anxiety disorder: Secondary | ICD-10-CM | POA: Diagnosis not present

## 2023-04-26 ENCOUNTER — Other Ambulatory Visit (HOSPITAL_COMMUNITY): Payer: Self-pay

## 2023-05-02 DIAGNOSIS — F411 Generalized anxiety disorder: Secondary | ICD-10-CM | POA: Diagnosis not present

## 2023-05-08 ENCOUNTER — Other Ambulatory Visit (HOSPITAL_COMMUNITY): Payer: Self-pay

## 2023-05-08 ENCOUNTER — Other Ambulatory Visit: Payer: Self-pay

## 2023-05-09 DIAGNOSIS — F411 Generalized anxiety disorder: Secondary | ICD-10-CM | POA: Diagnosis not present

## 2023-05-11 ENCOUNTER — Ambulatory Visit (INDEPENDENT_AMBULATORY_CARE_PROVIDER_SITE_OTHER): Payer: 59 | Admitting: Physician Assistant

## 2023-05-11 VITALS — BP 122/73 | HR 70 | Temp 98.5°F | Ht 64.0 in | Wt 245.0 lb

## 2023-05-11 DIAGNOSIS — Z6841 Body Mass Index (BMI) 40.0 and over, adult: Secondary | ICD-10-CM

## 2023-05-11 DIAGNOSIS — E669 Obesity, unspecified: Secondary | ICD-10-CM | POA: Diagnosis not present

## 2023-05-11 DIAGNOSIS — R7303 Prediabetes: Secondary | ICD-10-CM | POA: Diagnosis not present

## 2023-05-11 DIAGNOSIS — E559 Vitamin D deficiency, unspecified: Secondary | ICD-10-CM

## 2023-05-11 DIAGNOSIS — M25561 Pain in right knee: Secondary | ICD-10-CM | POA: Diagnosis not present

## 2023-05-11 DIAGNOSIS — M25569 Pain in unspecified knee: Secondary | ICD-10-CM

## 2023-05-11 NOTE — Progress Notes (Signed)
.smr  Office: [phone]  /  Fax: [phone]  WEIGHT SUMMARY AND BIOMETRICS  Vitals Temp: 98.5 F (36.9 C) BP: 122/73 Pulse Rate: 70 SpO2: 100 %   Anthropometric Measurements Height: 5\' 4"  (1.626 m) Weight: 245 lb (111.1 kg) BMI (Calculated): 42.03 Weight at Last Visit: 243 lb Weight Gained Since Last Visit: 2 Starting Weight: 251 lb   Body Composition  Body Fat %: 49.2 % Fat Mass (lbs): 120.8 lbs Muscle Mass (lbs): 118.4 lbs Total Body Water (lbs): 87.8 lbs Visceral Fat Rating : 15   Other Clinical Data Today's Visit #: 15 Starting Date: 05/13/23     HPI  Chief Complaint: OBESITY  Stacey Carlson is here to discuss her progress with her obesity treatment plan. She is on the the Category 3 Plan and states she is following her eating plan approximately 50 % of the time. She states she is exercising 0 minutes 0 times per week.  Discussed the use of AI scribe software for clinical note transcription with the patient, who gave verbal consent to proceed.  History of Present Illness   The patient, with a history of obesity and type 2 diabetes, presents with concerns about weight management. She reports a recent weight gain of 2-3 pounds, which she attributes to a lack of physical activity and inconsistent dietary habits. The patient admits to skipping meals, particularly lunch, and eating late in the evening due to her work schedule and family commitments. She also reports difficulty in maintaining a consistent exercise routine due to her partner's work schedule and the need to care for their young child.  The patient has made some attempts to improve her diet, including purchasing healthier food options such as fruits, vegetables, and low-calorie bread. 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 She also expresses a fear of trying new foods or dietary products due to concerns about  taste and potential gastrointestinal upset.  In addition to her dietary concerns, the patient reports issues with medication adherence. She admits to occasionally forgetting to take her metformin, a medication prescribed for her type 2 diabetes. She also mentions experiencing knee pain, particularly in her right knee, which has previously undergone two surgeries. The patient expresses a desire to seek orthopedic consultation for this issue.        Interval History:  Since last office visit she is up 2 lbs.  Down 6 lbs overall over past year.  Struggles with low appetite/skipping meals and with meal planning and prepping, but has done better over the past month with grocery shopping and trying to make better choices. We discussed focusing on protein intake and not skipping meals. We discussed the importance of regular meals and adequate protein intake for weight loss and overall health. The patient has made some attempts to improve her diet, including purchasing healthier food options such as fruits, vegetables, and low-calorie bread. 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 She also expresses a fear of trying new foods or dietary products due to concerns about taste and potential gastrointestinal upset.  Obesity/Weight Management: Recent weight gain of 2-3 pounds with a decrease in muscle mass and increase in adipose tissue. Patient reports inconsistent meal patterns, late-night eating, and lack of regular exercise. Stress from life events and work schedule also contributing to weight management difficulties. -Encourage consistent meal patterns, focusing on protein intake. -Advise on  reducing late-night eating. -Encourage regular exercise, considering joining a gym.Thinks she may join with [employer].  -Plan to reassess weight and body composition in one month.   Pharmacotherapy: metformin for primary  indication of prediabetes. Not taking consistently.   TREATMENT PLAN FOR OBESITY:  Recommended Dietary Goals  Gennesis is currently in the action stage of change. As such, her goal is to continue weight management plan. She has agreed to the Category 3 Plan.  Behavioral Intervention  We discussed the following Behavioral Modification Strategies today: increasing lean protein intake, decreasing simple carbohydrates , increasing vegetables, increasing lower glycemic fruits, increasing fiber rich foods, avoiding skipping meals, increasing water intake, work on meal planning and preparation, keeping healthy foods at home, continue to practice mindfulness when eating, and planning for success.  Additional resources provided today: NA  Recommended Physical Activity Goals  Ky has been advised to work up to 150 minutes of moderate intensity aerobic activity a week and strengthening exercises 2-3 times per week for cardiovascular health, weight loss maintenance and preservation of muscle mass.   She has agreed to Continue current level of physical activity , Think about ways to increase daily physical activity and overcoming barriers to exercise, and Increase physical activity in their day and reduce sedentary time (increase NEAT).   Pharmacotherapy We discussed various medication options to help Stacey Carlson with her weight loss efforts and we both agreed to continue metformin for primary indication of prediabetes.    Return in about 4 weeks (around 06/08/2023).[name] She was informed of the importance of frequent follow up visits to maximize her success with intensive lifestyle modifications for her multiple health conditions.  PHYSICAL EXAM:  Blood pressure 122/73, pulse 70, temperature 98.5 F (36.9 C), height 5\' 4"  (1.626 m), weight 245 lb (111.1 kg), SpO2 100%. Body mass index is 42.05 kg/m.  General: She is overweight, cooperative, alert, well developed, and in no acute distress. PSYCH: Has  normal mood, affect and thought process.   Cardiovascular: HR 70's regular, BP 122/73 Lungs: Normal breathing effort, no conversational dyspnea. Neuro: no focal deficits  DIAGNOSTIC DATA REVIEWED:  BMET    Component Value Date/Time   NA 143 03/14/2023 1244   K 4.8 03/14/2023 1244   CL 104 03/14/2023 1244   CO2 24 03/14/2023 1244   GLUCOSE 92 03/14/2023 1244   GLUCOSE 102 (H) 10/19/2019 0117   BUN 7 03/14/2023 1244   CREATININE 0.71 03/14/2023 1244   CREATININE 0.60 02/16/2012 1642   CALCIUM 9.8 03/14/2023 1244   GFRNONAA >60 11/15/2019 1530   GFRAA >60 11/15/2019 1530   Lab Results  Component Value Date   HGBA1C 6.1 (H) 03/14/2023   HGBA1C 6.0 (H) 05/12/2022   Lab Results  Component Value Date   INSULIN 10.6 11/08/2022   INSULIN 10.8 05/12/2022   Lab Results  Component Value Date   TSH 0.914 05/12/2022   CBC    Component Value Date/Time   WBC 5.3 05/12/2022 0958   WBC 14.1 (H) 11/16/2019 0655   RBC 4.26 05/12/2022 0958   RBC 3.13 (L) 11/16/2019 0655   HGB 12.8 05/12/2022 0958   HCT 38.5 05/12/2022 0958   PLT 284 05/12/2022 0958   MCV 90 05/12/2022 0958   MCH 30.0 05/12/2022 0958   MCH 27.5 11/16/2019 0655   MCHC 33.2 05/12/2022 0958   MCHC 32.7 11/16/2019 0655   RDW 12.4 05/12/2022 0958   Iron Studies No results found for: "IRON", "TIBC", "FERRITIN", "IRONPCTSAT" Lipid Panel     Component  Value Date/Time   CHOL 202 (H) 11/08/2022 0848   TRIG 77 11/08/2022 0848   HDL 68 11/08/2022 0848   CHOLHDL 2.7 02/16/2012 1642   VLDL 17 02/16/2012 1642   LDLCALC 120 (H) 11/08/2022 0848   Hepatic Function Panel     Component Value Date/Time   PROT 7.5 03/14/2023 1244   ALBUMIN 4.7 03/14/2023 1244   AST 16 03/14/2023 1244   ALT 13 03/14/2023 1244   ALKPHOS 98 03/14/2023 1244   BILITOT 0.3 03/14/2023 1244      Component Value Date/Time   TSH 0.914 05/12/2022 0958   Nutritional Lab Results  Component Value Date   VD25OH 36.8 03/14/2023   VD25OH 24.8  (L) 11/08/2022   VD25OH 6.9 (L) 05/12/2022    ASSOCIATED CONDITIONS ADDRESSED TODAY  ASSESSMENT AND PLAN  Problem List Items Addressed This Visit     Prediabetes - Primary   Vitamin D deficiency   BMI 40.0-44.9, adult (HCC), Current BMI 41.7   Obesity (HCC)- starting BMI-43.08   Prediabetes Last A1c was 6.1/ Insulin 10.6- not at goals  Medication(s): Metformin 500 mg once daily breakfast Not consistently remembering to take at least once daily.  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  Polyphagia:Yes Lab Results  Component Value Date   HGBA1C 6.1 (H) 03/14/2023   HGBA1C 6.1 (H) 11/08/2022   HGBA1C 6.0 (H) 05/12/2022   Lab Results  Component Value Date   INSULIN 10.6 11/08/2022   INSULIN 10.8 05/12/2022    Plan: Continue metformin 500 mg at least once daily . Reports she does not need refill currently.  Continue working on nutrition plan to decrease simple carbohydrates, increase lean proteins and exercise to promote weight loss, improve glycemic control and prevent progression to Type 2 diabetes.  Metformin Adherence: Patient reports inconsistent use of Metformin, often forgetting the evening dose. -Encourage consistent use of Metformin twice daily. -Consider use of a pill organizer to improve medication adherence. Will plan to recheck fasting labs in Sept.   Vitamin D Deficiency Vitamin D is not at goal of 50.  Most recent vitamin D level was 36.8- improving but not at goal. She is on  prescription ergocalciferol 50,000 IU weekly. Lab Results  Component Value Date   VD25OH 36.8 03/14/2023   VD25OH 24.8 (L) 11/08/2022   VD25OH 6.9 (L) 05/12/2022    Plan: Continue  prescription ergocalciferol 50,000 IU weekly Plan to recheck vitamin D level in Sept to optimize supplementation.  Low vitamin D levels can be  associated with adiposity and may result in leptin resistance and weight gain. Also associated with fatigue. Currently on vitamin D supplementation without any adverse effects.   Knee pain:  History of right knee surgery x 2.  She also mentions experiencing knee pain, particularly in her right knee. Plan: She is going to make a follow up appointment with her orthopedist.  Will follow with orthopedics.    Follow-up Plans: -Plan to recheck labs including A1C in September. -Schedule follow-up appointment for August 7th, 2024 at 3:15 PM.  ATTESTASTION STATEMENTS:  Reviewed by clinician on day of visit: allergies, medications, problem list, medical history, surgical history, family history, social history, and previous encounter notes.   I have personally spent 40 minutes total time today in preparation, patient care, nutritional counseling and documentation for this visit, including the following: review of clinical lab tests;  review of medical tests/procedures/services.      Romell Cavanah, PA-C

## 2023-05-12 ENCOUNTER — Other Ambulatory Visit: Payer: Self-pay | Admitting: Oncology

## 2023-05-12 DIAGNOSIS — Z006 Encounter for examination for normal comparison and control in clinical research program: Secondary | ICD-10-CM

## 2023-05-15 ENCOUNTER — Other Ambulatory Visit (HOSPITAL_COMMUNITY): Admission: RE | Admit: 2023-05-15 | Payer: 59 | Source: Home / Self Care

## 2023-05-16 ENCOUNTER — Other Ambulatory Visit (HOSPITAL_COMMUNITY)
Admission: RE | Admit: 2023-05-16 | Discharge: 2023-05-16 | Disposition: A | Discharge status: Home / Self Care | Payer: 59 | Source: Ambulatory Visit | Attending: Oncology | Admitting: Oncology

## 2023-05-16 DIAGNOSIS — F411 Generalized anxiety disorder: Secondary | ICD-10-CM | POA: Diagnosis not present

## 2023-05-16 DIAGNOSIS — Z006 Encounter for examination for normal comparison and control in clinical research program: Secondary | ICD-10-CM | POA: Insufficient documentation

## 2023-05-22 ENCOUNTER — Other Ambulatory Visit (HOSPITAL_COMMUNITY)
Admission: RE | Admit: 2023-05-22 | Discharge: 2023-05-22 | Disposition: A | Discharge status: Home / Self Care | Payer: Self-pay | Source: Other Acute Inpatient Hospital | Attending: Oncology | Admitting: Oncology

## 2023-05-22 ENCOUNTER — Other Ambulatory Visit: Payer: Self-pay | Admitting: Oncology

## 2023-05-22 DIAGNOSIS — Z139 Encounter for screening, unspecified: Secondary | ICD-10-CM

## 2023-05-23 DIAGNOSIS — F411 Generalized anxiety disorder: Secondary | ICD-10-CM | POA: Diagnosis not present

## 2023-06-06 DIAGNOSIS — F411 Generalized anxiety disorder: Secondary | ICD-10-CM | POA: Diagnosis not present

## 2023-06-07 ENCOUNTER — Encounter (INDEPENDENT_AMBULATORY_CARE_PROVIDER_SITE_OTHER): Payer: Self-pay | Admitting: Physician Assistant

## 2023-06-07 ENCOUNTER — Ambulatory Visit (INDEPENDENT_AMBULATORY_CARE_PROVIDER_SITE_OTHER): Payer: 59 | Admitting: Physician Assistant

## 2023-06-07 VITALS — BP 139/85 | HR 61 | Temp 98.2°F | Ht 64.0 in | Wt 247.0 lb

## 2023-06-07 DIAGNOSIS — R7303 Prediabetes: Secondary | ICD-10-CM | POA: Diagnosis not present

## 2023-06-07 DIAGNOSIS — R030 Elevated blood-pressure reading, without diagnosis of hypertension: Secondary | ICD-10-CM

## 2023-06-07 DIAGNOSIS — Z6841 Body Mass Index (BMI) 40.0 and over, adult: Secondary | ICD-10-CM

## 2023-06-07 DIAGNOSIS — M25569 Pain in unspecified knee: Secondary | ICD-10-CM | POA: Diagnosis not present

## 2023-06-07 DIAGNOSIS — E669 Obesity, unspecified: Secondary | ICD-10-CM

## 2023-06-07 NOTE — Progress Notes (Signed)
.smr  Office: [phone]  /  Fax: [phone]  WEIGHT SUMMARY AND BIOMETRICS  Vitals Temp: 98.2 F (36.8 C) BP: 139/85 Pulse Rate: 61 SpO2: 100 %  Anthropometric Measurements Height: 5\' 4"  (1.626 m) Weight: 247 lb (112 kg) BMI (Calculated): 42.38 Weight at Last Visit: 245 lb Weight Lost Since Last Visit: 0 Weight Gained Since Last Visit: 247 lb Starting Weight: 251 lb Total Weight Loss (lbs): 4 lb (1.814 kg) Peak Weight: 260 lb  Body Composition  Body Fat %: 48.7 % Fat Mass (lbs): 120.6 lbs Muscle Mass (lbs): 120.6 lbs Total Body Water (lbs): 88 lbs Visceral Fat Rating : 15  Other Clinical Data Fasting: no Labs: no Today's Visit #: 16 Starting Date: 05/12/22   HPI  Chief Complaint: OBESITY  Stacey Carlson is here to discuss her progress with her obesity treatment plan. She is on the the Category 3 Plan and states she is following her eating plan approximately 70 % of the time. She states she is exercising walking at work 30  minutes 5 times per week. Discussed the use of AI scribe software for clinical note transcription with the patient, who gave verbal consent to proceed.  History of Present Illness    Interval History:  Since last office visit she  up 2 lbs.   The patient, with a history of obesity, presents for a follow-up visit. She reports struggling with irregular eating patterns, often skipping meals, particularly lunch, due to her busy schedule. She acknowledges that this pattern of eating may be contributing to her weight management issues.  We discussed the importance of regular meals and adequate protein intake for weight loss and overall health.  She also admits to consuming high-caffeine energy drinks daily, which she uses as a substitute for meals when she is busy or feeling tired. She has tried to incorporate exercise into her routine, but admits that she has not been consistent.  In addition to her own health concerns, the patient is also dealing with  her young daughter's sleep and eating issues. Her daughter frequently wakes up during the night and has a low appetite, which has led to concerns about her weight. The patient's efforts to manage her daughter's issues, along with her own health concerns, appear to be contributing to her stress levels and may be impacting her ability to establish and maintain a healthy routine.      Pharmacotherapy: metformin - does not always remember to take consistently. No Gi side effects.   TREATMENT PLAN FOR OBESITY:  Recommended Dietary Goals  Stacey Carlson is currently in the action stage of change. As such, her goal is to continue weight management plan. She has agreed to the Category 3 Plan.  Behavioral Intervention  We discussed the following Behavioral Modification Strategies today: increasing lean protein intake, decreasing simple carbohydrates , increasing vegetables, increasing lower glycemic fruits, increasing fiber rich foods, avoiding skipping meals, increasing water intake, decreasing eating out or consumption of processed foods, and making healthy choices when eating convenient foods, emotional eating strategies and understanding the difference between hunger signals and cravings, work on managing stress, creating time for self-care and relaxation measures, continue to practice mindfulness when eating, and planning for success.  Additional resources provided today: NA  Recommended Physical Activity Goals  [name] has been advised to work up to 150 minutes of moderate intensity aerobic activity a week and strengthening exercises 2-3 times per week for cardiovascular health, weight loss maintenance and preservation of muscle mass.   She has agreed  to Think about ways to increase daily physical activity and overcoming barriers to exercise and Increase physical activity in their day and reduce sedentary time (increase NEAT).   Pharmacotherapy We discussed various medication options to help [name]  with her weight loss efforts and we both agreed to continue metformin for prediabetes.    Return in about 6 weeks (around 07/19/2023).[name] She was informed of the importance of frequent follow up visits to maximize her success with intensive lifestyle modifications for her multiple health conditions.  PHYSICAL EXAM:  Blood pressure 139/85, pulse 61, temperature 98.2 F (36.8 C), height 5\' 4"  (1.626 m), weight 247 lb (112 kg), SpO2 100%. Body mass index is 42.4 kg/m.  General: She is overweight, cooperative, alert, well developed, and in no acute distress. PSYCH: Has normal mood, affect and thought process.   Cardiovascular: HR 60's regular, BP 139/85- not at goal Lungs: Normal breathing effort, no conversational dyspnea.  DIAGNOSTIC DATA REVIEWED:  BMET    Component Value Date/Time   NA 143 03/14/2023 1244   K 4.8 03/14/2023 1244   CL 104 03/14/2023 1244   CO2 24 03/14/2023 1244   GLUCOSE 92 03/14/2023 1244   GLUCOSE 102 (H) 10/19/2019 0117   BUN 7 03/14/2023 1244   CREATININE 0.71 03/14/2023 1244   CREATININE 0.60 02/16/2012 1642   CALCIUM 9.8 03/14/2023 1244   GFRNONAA >60 11/15/2019 1530   GFRAA >60 11/15/2019 1530   Lab Results  Component Value Date   HGBA1C 6.1 (H) 03/14/2023   HGBA1C 6.0 (H) 05/12/2022   Lab Results  Component Value Date   INSULIN 10.6 11/08/2022   INSULIN 10.8 05/12/2022   Lab Results  Component Value Date   TSH 0.914 05/12/2022   CBC    Component Value Date/Time   WBC 5.3 05/12/2022 0958   WBC 14.1 (H) 11/16/2019 0655   RBC 4.26 05/12/2022 0958   RBC 3.13 (L) 11/16/2019 0655   HGB 12.8 05/12/2022 0958   HCT 38.5 05/12/2022 0958   PLT 284 05/12/2022 0958   MCV 90 05/12/2022 0958   MCH 30.0 05/12/2022 0958   MCH 27.5 11/16/2019 0655   MCHC 33.2 05/12/2022 0958   MCHC 32.7 11/16/2019 0655   RDW 12.4 05/12/2022 0958   Iron Studies No results found for: "IRON", "TIBC", "FERRITIN", "IRONPCTSAT" Lipid Panel     Component Value  Date/Time   CHOL 202 (H) 11/08/2022 0848   TRIG 77 11/08/2022 0848   HDL 68 11/08/2022 0848   CHOLHDL 2.7 02/16/2012 1642   VLDL 17 02/16/2012 1642   LDLCALC 120 (H) 11/08/2022 0848   Hepatic Function Panel     Component Value Date/Time   PROT 7.5 03/14/2023 1244   ALBUMIN 4.7 03/14/2023 1244   AST 16 03/14/2023 1244   ALT 13 03/14/2023 1244   ALKPHOS 98 03/14/2023 1244   BILITOT 0.3 03/14/2023 1244      Component Value Date/Time   TSH 0.914 05/12/2022 0958   Nutritional Lab Results  Component Value Date   VD25OH 36.8 03/14/2023   VD25OH 24.8 (L) 11/08/2022   VD25OH 6.9 (L) 05/12/2022    ASSOCIATED CONDITIONS ADDRESSED TODAY  ASSESSMENT AND PLAN  Problem List Items Addressed This Visit     Prediabetes - Primary   Elevated BP without diagnosis of hypertension   BMI 40.0-44.9, adult (HCC), Current BMI 41.7   Obesity (HCC)- starting BMI-43.08   Knee pain  Obesity No significant weight loss noted. Patient has inconsistent eating habits, often skipping meals and  consuming high caffeine energy drinks. Patient has joined a gym but has not started exercising regularly yet. -Encourage consistent meal times and healthier food choices. -Advise limiting caffeine intake to control blood pressure. -Encourage regular exercise, starting with light activities to avoid injury. -Schedule follow-up in 4-6 weeks to monitor progress.  Hypertension Blood pressure slightly elevated at 139/85. Likely related to high caffeine intake and possible stress from inconsistent sleep due to child's sleep issues. -Advise limiting caffeine intake. -Consider stress management techniques.  Knee Pain Patient reports knee pain, feels like "bone on bone". Has not seen an orthopedic specialist yet. -Encourage patient to consult with an orthopedic specialist. -Consider use of a knee brace for support.  Child's Sleep Issues Patient's child has inconsistent sleep patterns, affecting patient's sleep and  possibly contributing to stress and high blood pressure. Child is currently on melatonin. -Advise patient to consult with child's doctor about possibly adjusting melatonin dosage if child wakes up in the middle of the night.  ATTESTASTION STATEMENTS:  Reviewed by clinician on day of visit: allergies, medications, problem list, medical history, surgical history, family history, social history, and previous encounter notes.   I have personally spent 48 minutes total time today in preparation, patient care, nutritional counseling and documentation for this visit, including the following: review of clinical lab tests; review of medical tests/procedures/services.       , PA-C

## 2023-06-11 DIAGNOSIS — M25569 Pain in unspecified knee: Secondary | ICD-10-CM | POA: Insufficient documentation

## 2023-06-14 ENCOUNTER — Other Ambulatory Visit (HOSPITAL_COMMUNITY): Payer: Self-pay

## 2023-06-14 DIAGNOSIS — M25561 Pain in right knee: Secondary | ICD-10-CM | POA: Diagnosis not present

## 2023-06-14 MED ORDER — MELOXICAM 15 MG PO TABS
15.0000 mg | ORAL_TABLET | Freq: Every day | ORAL | 1 refills | Status: DC
  Filled 2023-06-14: qty 30, 30d supply, fill #0
  Filled 2023-08-14: qty 30, 30d supply, fill #1

## 2023-06-14 MED ORDER — MELOXICAM 7.5 MG PO TABS
7.5000 mg | ORAL_TABLET | Freq: Every day | ORAL | 1 refills | Status: DC
  Filled 2023-06-14: qty 30, 30d supply, fill #0

## 2023-06-15 ENCOUNTER — Other Ambulatory Visit (HOSPITAL_COMMUNITY): Payer: Self-pay

## 2023-06-15 ENCOUNTER — Other Ambulatory Visit (INDEPENDENT_AMBULATORY_CARE_PROVIDER_SITE_OTHER): Payer: Self-pay | Admitting: Physician Assistant

## 2023-06-15 DIAGNOSIS — E559 Vitamin D deficiency, unspecified: Secondary | ICD-10-CM

## 2023-06-20 DIAGNOSIS — F411 Generalized anxiety disorder: Secondary | ICD-10-CM | POA: Diagnosis not present

## 2023-06-27 DIAGNOSIS — F411 Generalized anxiety disorder: Secondary | ICD-10-CM | POA: Diagnosis not present

## 2023-07-12 ENCOUNTER — Encounter (INDEPENDENT_AMBULATORY_CARE_PROVIDER_SITE_OTHER): Payer: Self-pay | Admitting: Physician Assistant

## 2023-07-12 ENCOUNTER — Other Ambulatory Visit (HOSPITAL_COMMUNITY): Payer: Self-pay

## 2023-07-12 ENCOUNTER — Ambulatory Visit (INDEPENDENT_AMBULATORY_CARE_PROVIDER_SITE_OTHER): Payer: 59 | Admitting: Physician Assistant

## 2023-07-12 VITALS — BP 134/74 | HR 65 | Temp 98.3°F | Ht 64.0 in | Wt 247.0 lb

## 2023-07-12 DIAGNOSIS — R7303 Prediabetes: Secondary | ICD-10-CM | POA: Diagnosis not present

## 2023-07-12 DIAGNOSIS — Z6841 Body Mass Index (BMI) 40.0 and over, adult: Secondary | ICD-10-CM | POA: Diagnosis not present

## 2023-07-12 DIAGNOSIS — E7849 Other hyperlipidemia: Secondary | ICD-10-CM

## 2023-07-12 DIAGNOSIS — E785 Hyperlipidemia, unspecified: Secondary | ICD-10-CM

## 2023-07-12 DIAGNOSIS — E559 Vitamin D deficiency, unspecified: Secondary | ICD-10-CM

## 2023-07-12 MED ORDER — METFORMIN HCL 500 MG PO TABS
500.0000 mg | ORAL_TABLET | Freq: Two times a day (BID) | ORAL | 3 refills | Status: AC
  Filled 2023-07-12 – 2023-08-14 (×2): qty 180, 90d supply, fill #0
  Filled 2024-05-06: qty 180, 90d supply, fill #1

## 2023-07-12 MED ORDER — VITAMIN D (ERGOCALCIFEROL) 1.25 MG (50000 UNIT) PO CAPS
50000.0000 [IU] | ORAL_CAPSULE | ORAL | 0 refills | Status: AC
Start: 2023-07-12 — End: ?
  Filled 2023-07-12 – 2023-08-14 (×2): qty 4, 28d supply, fill #0

## 2023-07-12 NOTE — Progress Notes (Signed)
.smr  Office: [phone]  /  Fax: [phone]  WEIGHT SUMMARY AND BIOMETRICS  Vitals Temp: 98.3 F (36.8 C) BP: 134/74 Pulse Rate: 65 SpO2: 100 %   Anthropometric Measurements Height: 5\' 4"  (1.626 m) Weight: 247 lb (112 kg) BMI (Calculated): 42.38 Weight at Last Visit: 247 lb Weight Lost Since Last Visit: 0 Weight Gained Since Last Visit: 0 Starting Weight: 251 lb Total Weight Loss (lbs): 4 lb (1.814 kg)   Body Composition  Body Fat %: 49 % Fat Mass (lbs): 121.4 lbs Muscle Mass (lbs): 119.8 lbs Total Body Water (lbs): 88 lbs Visceral Fat Rating : 15   Other Clinical Data Today's Visit #: 17 Starting Date: 05/12/22     HPI  Chief Complaint: OBESITY  Lianah is here to discuss her progress with her obesity treatment plan. She is on the the Category 3 Plan and states she is following her eating plan approximately 80 % of the time. She states she is exercising Gym 60 minutes 1 times per week.  Discussed the use of AI scribe software for clinical note transcription with the patient, who gave verbal consent to proceed.  History of Present Illness  /      Plan to repeat fasting labs in November.   Interval History:  Since last office visit she has maintained her weight.   The patient, with a history of obesity, prediabetes, and vitamin D deficiency, presents for a follow-up visit. She reports making efforts to incorporate exercise into her routine, although not consistently. She mentions that she and her husband have been making dietary changes, including reducing their intake of regular sodas. She also reports taking metformin twice a day and vitamin D, with no reported side effects. The patient also mentions a knee problem for which she is taking meloxicam, which she reports is helping with her knee pain. She has noticed an improvement in her A1C levels, which she is pleased about. However, she mentions that her LDL cholesterol is slightly elevated. The patient's  family history is significant for strokes and heart disease.   Pharmacotherapy: metformin for primary indication of prediabetes. Taking 1000 mg daily in morning as inconsistent with remembering to take pm dose.   TREATMENT PLAN FOR OBESITY:  Recommended Dietary Goals  Darrel is currently in the action stage of change. As such, her goal is to continue weight management plan. She has agreed to the Category 3 Plan.  Behavioral Intervention  We discussed the following Behavioral Modification Strategies today: increasing lean protein intake, decreasing simple carbohydrates , increasing vegetables, increasing lower glycemic fruits, increasing fiber rich foods, avoiding skipping meals, increasing water intake, decreasing eating out or consumption of processed foods, and making healthy choices when eating convenient foods, emotional eating strategies and understanding the difference between hunger signals and cravings, work on managing stress, creating time for self-care and relaxation measures, continue to practice mindfulness when eating, and planning for success.  Additional resources provided today: NA  Recommended Physical Activity Goals  Sama has been advised to work up to 150 minutes of moderate intensity aerobic activity a week and strengthening exercises 2-3 times per week for cardiovascular health, weight loss maintenance and preservation of muscle mass.   She has agreed to Continue current level of physical activity  and Think about ways to increase daily physical activity and overcoming barriers to exercise   Pharmacotherapy We discussed various medication options to help Sherill with her weight loss efforts and we both agreed to continue metformin for primary indication  of prediabetes.    No follow-ups on file.[name] She was informed of the importance of frequent follow up visits to maximize her success with intensive lifestyle modifications for her multiple health  conditions.  PHYSICAL EXAM:  Blood pressure 134/74, pulse 65, temperature 98.3 F (36.8 C), height 5\' 4"  (1.626 m), weight 247 lb (112 kg), SpO2 100%. Body mass index is 42.4 kg/m.  General: She is overweight, cooperative, alert, well developed, and in no acute distress. PSYCH: Has normal mood, affect and thought process.   Lungs: Normal breathing effort, no conversational dyspnea.  DIAGNOSTIC DATA REVIEWED:  BMET    Component Value Date/Time   NA 143 03/14/2023 1244   K 4.8 03/14/2023 1244   CL 104 03/14/2023 1244   CO2 24 03/14/2023 1244   GLUCOSE 92 03/14/2023 1244   GLUCOSE 102 (H) 10/19/2019 0117   BUN 7 03/14/2023 1244   CREATININE 0.71 03/14/2023 1244   CREATININE 0.60 02/16/2012 1642   CALCIUM 9.8 03/14/2023 1244   GFRNONAA >60 11/15/2019 1530   GFRAA >60 11/15/2019 1530   Lab Results  Component Value Date   HGBA1C 6.1 (H) 03/14/2023   HGBA1C 6.0 (H) 05/12/2022   Lab Results  Component Value Date   INSULIN 10.6 11/08/2022   INSULIN 10.8 05/12/2022   Lab Results  Component Value Date   TSH 0.914 05/12/2022   CBC    Component Value Date/Time   WBC 5.3 05/12/2022 0958   WBC 14.1 (H) 11/16/2019 0655   RBC 4.26 05/12/2022 0958   RBC 3.13 (L) 11/16/2019 0655   HGB 12.8 05/12/2022 0958   HCT 38.5 05/12/2022 0958   PLT 284 05/12/2022 0958   MCV 90 05/12/2022 0958   MCH 30.0 05/12/2022 0958   MCH 27.5 11/16/2019 0655   MCHC 33.2 05/12/2022 0958   MCHC 32.7 11/16/2019 0655   RDW 12.4 05/12/2022 0958   Iron Studies No results found for: "IRON", "TIBC", "FERRITIN", "IRONPCTSAT" Lipid Panel     Component Value Date/Time   CHOL 202 (H) 11/08/2022 0848   TRIG 77 11/08/2022 0848   HDL 68 11/08/2022 0848   CHOLHDL 2.7 02/16/2012 1642   VLDL 17 02/16/2012 1642   LDLCALC 120 (H) 11/08/2022 0848   Hepatic Function Panel     Component Value Date/Time   PROT 7.5 03/14/2023 1244   ALBUMIN 4.7 03/14/2023 1244   AST 16 03/14/2023 1244   ALT 13 03/14/2023  1244   ALKPHOS 98 03/14/2023 1244   BILITOT 0.3 03/14/2023 1244      Component Value Date/Time   TSH 0.914 05/12/2022 0958   Nutritional Lab Results  Component Value Date   VD25OH 36.8 03/14/2023   VD25OH 24.8 (L) 11/08/2022   VD25OH 6.9 (L) 05/12/2022    ASSOCIATED CONDITIONS ADDRESSED TODAY  ASSESSMENT AND PLAN  Problem List Items Addressed This Visit     Prediabetes - Primary   Vitamin D deficiency   Obesity (HCC)- starting BMI-43.08  Obesity BMI of 42.4 with a goal of 25 or less. Discussed the importance of consistent exercise to build muscle mass and reduce adipose tissue. -Encouraged to continue efforts to increase physical activity and maintain a healthy diet.  Prediabetes Improved HbA1c from 6.1 to 5.9. Currently dosed Metformin twice daily but takes once daily in am. -Continue Metformin 1000 mg once daily. -Plan to check labs in November.  Vitamin D deficiency Last level was low. -Refill Vitamin D prescription.  Dyslipidemia LDL slightly elevated at 120 with a goal of less  than 100. Discussed potential future treatment options but patient prefers to attempt lifestyle modifications first. -Continue monitoring LDL levels. -Consider future pharmacological intervention if lifestyle modifications are not sufficient.  Family history of cardiovascular disease Discussed potential increased risk due to elevated lipoprotein A and family history. Current 10-year cardiovascular risk calculated at 1.5%. -Continue monitoring cardiovascular risk factors. -Plan to check labs in November.  Hyperlipidemia LDL is not at goal. Medication(s): None Cardiovascular risk factors: dyslipidemia, obesity (BMI >= 30 kg/m2), and sedentary lifestyle  Lab Results  Component Value Date   CHOL 202 (H) 11/08/2022   HDL 68 11/08/2022   LDLCALC 120 (H) 11/08/2022   TRIG 77 11/08/2022   CHOLHDL 2.7 02/16/2012   Lab Results  Component Value Date   ALT 13 03/14/2023   AST 16  03/14/2023   ALKPHOS 98 03/14/2023   BILITOT 0.3 03/14/2023   The 10-year ASCVD risk score (Arnett DK, et al., 2019) is: 1.5%   Values used to calculate the score:     Age: 62 years     Sex: Female     Is Non-Hispanic African American: Yes     Diabetic: No     Tobacco smoker: No     Systolic Blood Pressure: 134 mmHg     Is BP treated: No     HDL Cholesterol: 68 mg/dL     Total Cholesterol: 202 mg/dL  Plan:  Continue to work on nutrition plan -decreasing simple carbohydrates, increasing lean proteins, decreasing saturated fats and cholesterol , avoiding trans fats and exercise as able to promote weight loss, improve lipids and decrease cardiovascular risks.     ATTESTASTION STATEMENTS:  Reviewed by clinician on day of visit: allergies, medications, problem list, medical history, surgical history, family history, social history, and previous encounter notes.   I have personally spent 45 minutes total time today in preparation, patient care, nutritional counseling and documentation for this visit, including the following: review of clinical lab tests; review of medical tests/procedures/services.      Aleia Larocca, PA-C

## 2023-07-19 DIAGNOSIS — M25561 Pain in right knee: Secondary | ICD-10-CM | POA: Diagnosis not present

## 2023-07-24 ENCOUNTER — Other Ambulatory Visit (HOSPITAL_COMMUNITY): Payer: Self-pay

## 2023-08-01 DIAGNOSIS — F411 Generalized anxiety disorder: Secondary | ICD-10-CM | POA: Diagnosis not present

## 2023-08-07 ENCOUNTER — Encounter (HOSPITAL_COMMUNITY): Payer: Self-pay | Admitting: *Deleted

## 2023-08-07 ENCOUNTER — Emergency Department (HOSPITAL_COMMUNITY): Payer: 59

## 2023-08-07 ENCOUNTER — Other Ambulatory Visit: Payer: Self-pay

## 2023-08-07 ENCOUNTER — Emergency Department (HOSPITAL_COMMUNITY)
Admission: EM | Admit: 2023-08-07 | Discharge: 2023-08-07 | Disposition: A | Discharge status: Home / Self Care | Payer: 59 | Attending: Emergency Medicine | Admitting: Emergency Medicine

## 2023-08-07 DIAGNOSIS — R5383 Other fatigue: Secondary | ICD-10-CM | POA: Insufficient documentation

## 2023-08-07 DIAGNOSIS — Z91040 Latex allergy status: Secondary | ICD-10-CM | POA: Diagnosis not present

## 2023-08-07 DIAGNOSIS — R42 Dizziness and giddiness: Secondary | ICD-10-CM | POA: Diagnosis not present

## 2023-08-07 DIAGNOSIS — Z7984 Long term (current) use of oral hypoglycemic drugs: Secondary | ICD-10-CM | POA: Diagnosis not present

## 2023-08-07 LAB — URINALYSIS, ROUTINE W REFLEX MICROSCOPIC
Bacteria, UA: NONE SEEN
Bilirubin Urine: NEGATIVE
Glucose, UA: NEGATIVE mg/dL
Hgb urine dipstick: NEGATIVE
Ketones, ur: NEGATIVE mg/dL
Nitrite: NEGATIVE
Protein, ur: 30 mg/dL — AB
Specific Gravity, Urine: 1.03 (ref 1.005–1.030)
pH: 7 (ref 5.0–8.0)

## 2023-08-07 LAB — CBC WITH DIFFERENTIAL/PLATELET
Abs Immature Granulocytes: 0.02 10*3/uL (ref 0.00–0.07)
Basophils Absolute: 0 10*3/uL (ref 0.0–0.1)
Basophils Relative: 1 %
Eosinophils Absolute: 0 10*3/uL (ref 0.0–0.5)
Eosinophils Relative: 0 %
HCT: 42.7 % (ref 36.0–46.0)
Hemoglobin: 14 g/dL (ref 12.0–15.0)
Immature Granulocytes: 0 %
Lymphocytes Relative: 15 %
Lymphs Abs: 1 10*3/uL (ref 0.7–4.0)
MCH: 30.4 pg (ref 26.0–34.0)
MCHC: 32.8 g/dL (ref 30.0–36.0)
MCV: 92.8 fL (ref 80.0–100.0)
Monocytes Absolute: 0.5 10*3/uL (ref 0.1–1.0)
Monocytes Relative: 7 %
Neutro Abs: 4.8 10*3/uL (ref 1.7–7.7)
Neutrophils Relative %: 77 %
Platelets: 303 10*3/uL (ref 150–400)
RBC: 4.6 MIL/uL (ref 3.87–5.11)
RDW: 13 % (ref 11.5–15.5)
WBC: 6.3 10*3/uL (ref 4.0–10.5)
nRBC: 0 % (ref 0.0–0.2)

## 2023-08-07 LAB — COMPREHENSIVE METABOLIC PANEL
ALT: 17 U/L (ref 0–44)
AST: 16 U/L (ref 15–41)
Albumin: 4.3 g/dL (ref 3.5–5.0)
Alkaline Phosphatase: 86 U/L (ref 38–126)
Anion gap: 7 (ref 5–15)
BUN: 16 mg/dL (ref 6–20)
CO2: 29 mmol/L (ref 22–32)
Calcium: 9.5 mg/dL (ref 8.9–10.3)
Chloride: 100 mmol/L (ref 98–111)
Creatinine, Ser: 0.86 mg/dL (ref 0.44–1.00)
GFR, Estimated: >60 mL/min (ref >60)
Glucose, Bld: 129 mg/dL — ABNORMAL HIGH (ref 70–99)
Potassium: 3.8 mmol/L (ref 3.5–5.1)
Sodium: 136 mmol/L (ref 135–145)
Total Bilirubin: 0.4 mg/dL (ref 0.3–1.2)
Total Protein: 8.2 g/dL — ABNORMAL HIGH (ref 6.5–8.1)

## 2023-08-07 LAB — HCG, QUANTITATIVE, PREGNANCY: hCG, Beta Chain, Quant, S: 2 m[IU]/mL (ref <5)

## 2023-08-07 LAB — TROPONIN I (HIGH SENSITIVITY): Troponin I (High Sensitivity): 8 ng/L (ref <18)

## 2023-08-07 MED ORDER — METOCLOPRAMIDE HCL 5 MG/ML IJ SOLN
10.0000 mg | Freq: Once | INTRAMUSCULAR | Status: AC
  Administered 2023-08-07: 10 mg via INTRAVENOUS
  Filled 2023-08-07: qty 2

## 2023-08-07 MED ORDER — DIPHENHYDRAMINE HCL 50 MG/ML IJ SOLN
25.0000 mg | Freq: Once | INTRAMUSCULAR | Status: AC
  Administered 2023-08-07: 25 mg via INTRAVENOUS
  Filled 2023-08-07: qty 1

## 2023-08-07 MED ORDER — SODIUM CHLORIDE 0.9 % IV BOLUS
1000.0000 mL | Freq: Once | INTRAVENOUS | Status: AC
  Administered 2023-08-07: 1000 mL via INTRAVENOUS

## 2023-08-07 NOTE — ED Triage Notes (Signed)
Pt with dizziness since this morning.  Felt jittery and ate,  emesis x 1.  Hx of same years ago and was pregnant at the time, denies pregnancy at present. Pt states her head feels heavy and off balance with lifting her head up. Been off her medications for anxiety, not able to get her medications.

## 2023-08-07 NOTE — ED Provider Notes (Signed)
[place] EMERGENCY DEPARTMENT AT [facility] Provider Note   CSN: [id number] Arrival date & time: 08/07/23  1337     History  Chief Complaint  Patient presents with   Dizziness    Stacey Carlson is a 47 y.o. female, history of prediabetes, who presents to the ED secondary to feeling off since this morning.  She states she just felt kind of off, and shaky and jittery, went to work, around 10:00, she started getting dizzy.  States it was worse when she would move her head, and she states she felt like she needed to put her head down, to rest.  Notes that dizziness is worse with moving her head back-and-forth.  States she just feels very tired, and like her by whole body is limp.  Denies any weakness on one side of the body, changes in speech, confusion.  Denies any chest pain, shortness of breath.  Has not taken anything for this, states it feels better when she has her head slumped down.  Notes she has been more overwhelmed at work, and that this may be contributing to it.  States it feels like things are spinning when she moves her head back-and-forth.  Denies any hearing loss, changes in hearing, facial pain.  Home Medications Prior to Admission medications   Medication Sig Start Date End Date Taking? Authorizing Provider  Albuterol Sulfate, sensor, (PROAIR DIGIHALER) 108 (90 Base) MCG/ACT AEPB Inhale 2 puffs by mouth every 8 (eight) hours as needed. 01/12/22     escitalopram (LEXAPRO) 10 MG tablet Take 1 tablet (10 mg total) by mouth daily as directed 04/06/23     fluticasone (FLONASE) 50 MCG/ACT nasal spray Place 1 spray into both nostrils daily.    [provider]  meloxicam (MOBIC) 15 MG tablet Take 1 tablet (15 mg total) by mouth daily with a  meal 06/14/23     metFORMIN (GLUCOPHAGE) 500 MG tablet Take 1 tablet (500 mg total) by mouth 2 (two) times daily with a meal. 07/12/23   Rayburn, Fanny Bien, PA-C  polyethylene glycol (GOLYTELY) 236 g solution Use as  directed - see administration instructions from your provider 03/24/23     Vitamin D, Ergocalciferol, (DRISDOL) 1.25 MG (50000 UNIT) CAPS capsule Take 1 capsule (50,000 Units total) by mouth every 7 (seven) days. 07/12/23   Rayburn, Fanny Bien, PA-C  sertraline (ZOLOFT) 50 MG tablet Take 1 tablet (50 mg total) by mouth daily as directed 01/12/22 06/09/22        Allergies    Latex    Review of Systems   Review of Systems  Neurological:  Positive for dizziness.    Physical Exam Updated Vital Signs BP 114/67   Pulse 67   Temp (!) 96.5 F (35.8 C) (Temporal)   Resp 17   Ht 5\' 4"  (1.626 m)   Wt 111.1 kg   LMP  (LMP Unknown)   SpO2 98%   BMI 42.05 kg/m  Physical Exam Vitals and nursing note reviewed.  Constitutional:      General: She is not in acute distress.    Appearance: She is well-developed.  HENT:     Head: Normocephalic and atraumatic.  Eyes:     Conjunctiva/sclera: Conjunctivae normal.     Comments: +constricted pupils bilaterally  Cardiovascular:     Rate and Rhythm: Normal rate and regular rhythm.     Heart sounds: No murmur heard. Pulmonary:     Effort: Pulmonary effort is normal. No respiratory distress.  Breath sounds: Normal breath sounds.  Abdominal:     Palpations: Abdomen is soft.     Tenderness: There is no abdominal tenderness.  Musculoskeletal:        General: No swelling.     Cervical back: Neck supple.  Skin:    General: Skin is warm and dry.     Capillary Refill: Capillary refill takes less than 2 seconds.  Neurological:     Mental Status: She is alert.     Cranial Nerves: Cranial nerves 2-12 are intact.     Sensory: Sensation is intact.     Motor: Motor function is intact.     Coordination: Coordination is intact.     Gait: Gait is intact.  Psychiatric:        Mood and Affect: Mood normal.     ED Results / Procedures / Treatments   Labs (all labs ordered are listed, but only abnormal results are displayed) Labs Reviewed   COMPREHENSIVE METABOLIC PANEL - Abnormal; Notable for the following components:      Result Value   Glucose, Bld 129 (*)    Total Protein 8.2 (*)    All other components within normal limits  URINALYSIS, ROUTINE W REFLEX MICROSCOPIC - Abnormal; Notable for the following components:   APPearance HAZY (*)    Protein, ur 30 (*)    Leukocytes,Ua Nastasha Reising (*)    All other components within normal limits  HCG, QUANTITATIVE, PREGNANCY  CBC WITH DIFFERENTIAL/PLATELET  TROPONIN I (HIGH SENSITIVITY)  TROPONIN I (HIGH SENSITIVITY)    EKG None  Radiology CT Head Wo Contrast  Result Date: 08/07/2023 CLINICAL DATA:  Dizziness, vomiting EXAM: CT HEAD WITHOUT CONTRAST TECHNIQUE: Contiguous axial images were obtained from the base of the skull through the vertex without intravenous contrast. RADIATION DOSE REDUCTION: This exam was performed according to the departmental dose-optimization program which includes automated exposure control, adjustment of the mA and/or kV according to patient size and/or use of iterative reconstruction technique. COMPARISON:  None Available. FINDINGS: Brain: No acute intracranial abnormality. Specifically, no hemorrhage, hydrocephalus, mass lesion, acute infarction, or significant intracranial injury. Vascular: No hyperdense vessel or unexpected calcification. Skull: No acute calvarial abnormality. Sinuses/Orbits: No acute findings Other: None IMPRESSION: Normal study. Electronically Signed   By: Charlett Nose M.D.   On: 08/07/2023 15:56    Procedures Procedures    Medications Ordered in ED Medications  metoCLOPramide (REGLAN) injection 10 mg (10 mg Intravenous Given 08/07/23 1605)  diphenhydrAMINE (BENADRYL) injection 25 mg (25 mg Intravenous Given 08/07/23 1604)  sodium chloride 0.9 % bolus 1,000 mL (1,000 mLs Intravenous New Bag/Given 08/07/23 1602)    ED Course/ Medical Decision Making/ A&P                                 Medical Decision Making Patient is a  47 year old female, here for feeling unwell this morning, and then, around 10:00.  She states she just feels off, and is having a hard time looking at me, because the light bothers her.  States her head feels very heavy.  She did constricted pupils on exam, but is having difficult time tolerating light.  No neck stiffness, or fever or chills.  We obtain a head CT, for further evaluation as well as blood work, to further evaluate will give a headache cocktail and see see if she has any improvement she has no focal deficits on my exam, is overall well-appearing  except for just appears to hit the light.  Good strength of bilateral upper and lower extremities.  Amount and/or Complexity of Data Reviewed Labs: ordered.    Details: Unremarkable Radiology: ordered.    Details: CT head negative ECG/medicine tests:  Decision-making details documented in ED Course. Discussion of management or test interpretation with external provider(s): Discussed with patient, she is feeling much better after headache cocktail, I am suspicious this may represent a complex migraine, versus vertiginous migraine, she is feeling much better, and is discharged home with strict return precautions.  Her CAT scan was reassuring, she has no focal deficits on my exam, she is able to tolerate light much more readily after the headache cocktail, her pupils are not constricted anymore.  It may have been secondary to difficulty tolerating light or cluster headache.  We will have her follow-up with her primary care doctor, return if symptoms worsen.  Risk Prescription drug management.   Final Clinical Impression(s) / ED Diagnoses Final diagnoses:  Dizziness  Other fatigue    Rx / DC Orders ED Discharge Orders     None         Stacey Carlson, Harley [place], PA 08/07/23 1656    Loetta Rough, MD 08/08/23 1523

## 2023-08-07 NOTE — Discharge Instructions (Addendum)
Your symptoms may represent a complex migraine, I am glad you are feeling much better, your head CT was reassuring as well as your blood work was.  Please follow-up with your primary care doctor, return to the ER if you feel like your symptoms are worsening  [facility] Doctor List    Syliva Overman, MD. Specialty: [facility] Medicine Contact information: [address]  [place] [address]  [phone]   Lilyan Punt, MD. Specialty: [facility] Medicine Contact information: [address]  [place] [address]  [phone]   Avon Gully, MD Specialty: Internal Medicine Contact information: [address] [place] [address]  [phone]   Catalina Pizza, MD. Specialty: Internal Medicine Contact information: [address]  [place] [address]  [phone]    [facility] (Dr. Selena Batten) Specialty: Family Medicine Contact information: [address]  [place] [address]  [phone]   John Giovanni, MD. Specialty: [facility] Medicine Contact information: [address]  [address]  [place] [address]  [phone]   Carylon Perches, MD. Specialty: Internal Medicine Contact information: [address]  [address]  [place] [address]  [phone]   [facility] Family Medicine: [address] [phone]  [facility], Family medicine [address]  [phone]  [facility] [address] [place], [place] [phone]  [facility]: [address] [phone]    [facility]  [address] [place], [place] [address] [phone]  Services [facility] offers a variety of basic health services.  Services include but are not limited to: Blood pressure checks  Heart rate checks  Blood sugar checks  Urine analysis  Rapid strep tests  Pregnancy tests.  Health education and referrals  People needing more  complex services will be directed to a physician online. Using these virtual visits, doctors can evaluate and prescribe medicine and treatments. There will be no medication on-site, though [facility] will help patients fill their prescriptions at little to no cost.   For More information please go to: [email or web address]  Allergy and Asthma:    [address] [place] [phone]  Urology:  [address]  [place] [phone]  [facility]  [address] [place], [place] [phone]  Orthopedics   [address] [place], [place] [phone]  Endocrinology  [address] [place], [place] [phone]  Podiatry: [facility] [phone]

## 2023-08-08 ENCOUNTER — Ambulatory Visit (INDEPENDENT_AMBULATORY_CARE_PROVIDER_SITE_OTHER): Payer: 59 | Admitting: Physician Assistant

## 2023-08-08 NOTE — Progress Notes (Deleted)
.smr  Office: [phone]  /  Fax: [phone]  WEIGHT SUMMARY AND BIOMETRICS  Vitals Temp: (!) 96.5 F (35.8 C) BP: 114/67 Pulse Rate: 67 SpO2: 98 %   Anthropometric Measurements Height: 5\' 4"  (1.626 m) Weight: 245 lb (111.1 kg) BMI (Calculated): 42.03   No data recorded No data recorded   HPI  Chief Complaint: OBESITY  Stacey Carlson is here to discuss her progress with her obesity treatment plan. She is on the {MWMwtlossportion/plan2:23431} and states she is following her eating plan approximately *** % of the time. She states she is exercising *** minutes *** times per week.   Interval History:  Since last office visit she *** Hunger/appetite-*** Cravings- *** Stress- *** Sleep- *** Exercise-*** Hydration-***   Pharmacotherapy: ***  TREATMENT PLAN FOR OBESITY:  Recommended Dietary Goals  [name] is currently in the action stage of change. As such, her goal is to continue weight management plan. She has agreed to {MWMwtlossportion/plan2:23431}.  Behavioral Intervention  We discussed the following Behavioral Modification Strategies today: {EMWMwtlossstrategies:28914::"continue to work on maintaining a reduced calorie state, getting the recommended amount of protein, incorporating whole foods, making healthy choices, staying well hydrated and practicing mindfulness when eating."}.  Additional resources provided today: NA  Recommended Physical Activity Goals  [name] has been advised to work up to 150 minutes of moderate intensity aerobic activity a week and strengthening exercises 2-3 times per week for cardiovascular health, weight loss maintenance and preservation of muscle mass.   She has agreed to {EMEXERCISE:28847::"Think about enjoyable ways to increase daily physical activity and overcoming barriers to exercise","Increase physical activity in their day and reduce sedentary time (increase NEAT)."}   Pharmacotherapy We discussed various medication options  to help [name] with her weight loss efforts and we both agreed to ***.    No follow-ups on file.[name] She was informed of the importance of frequent follow up visits to maximize her success with intensive lifestyle modifications for her multiple health conditions.  PHYSICAL EXAM:  There were no vitals taken for this visit. There is no height or weight on file to calculate BMI.  General: She is overweight, cooperative, alert, well developed, and in no acute distress. PSYCH: Has normal mood, affect and thought process.   Lungs: Normal breathing effort, no conversational dyspnea.  DIAGNOSTIC DATA REVIEWED:  BMET    Component Value Date/Time   NA 136 08/07/2023 1551   NA 143 03/14/2023 1244   K 3.8 08/07/2023 1551   CL 100 08/07/2023 1551   CO2 29 08/07/2023 1551   GLUCOSE 129 (H) 08/07/2023 1551   BUN 16 08/07/2023 1551   BUN 7 03/14/2023 1244   CREATININE 0.86 08/07/2023 1551   CREATININE 0.60 02/16/2012 1642   CALCIUM 9.5 08/07/2023 1551   GFRNONAA >60 08/07/2023 1551   GFRAA >60 11/15/2019 1530   Lab Results  Component Value Date   HGBA1C 6.1 (H) 03/14/2023   HGBA1C 6.0 (H) 05/12/2022   Lab Results  Component Value Date   INSULIN 10.6 11/08/2022   INSULIN 10.8 05/12/2022   Lab Results  Component Value Date   TSH 0.914 05/12/2022   CBC    Component Value Date/Time   WBC 6.3 08/07/2023 1551   RBC 4.60 08/07/2023 1551   HGB 14.0 08/07/2023 1551   HGB 12.8 05/12/2022 0958   HCT 42.7 08/07/2023 1551   HCT 38.5 05/12/2022 0958   PLT 303 08/07/2023 1551   PLT 284 05/12/2022 0958   MCV 92.8 08/07/2023 1551   MCV 90 05/12/2022  0958   MCH 30.4 08/07/2023 1551   MCHC 32.8 08/07/2023 1551   RDW 13.0 08/07/2023 1551   RDW 12.4 05/12/2022 0958   Iron Studies No results found for: "IRON", "TIBC", "FERRITIN", "IRONPCTSAT" Lipid Panel     Component Value Date/Time   CHOL 202 (H) 11/08/2022 0848   TRIG 77 11/08/2022 0848   HDL 68 11/08/2022 0848   CHOLHDL 2.7  02/16/2012 1642   VLDL 17 02/16/2012 1642   LDLCALC 120 (H) 11/08/2022 0848   Hepatic Function Panel     Component Value Date/Time   PROT 8.2 (H) 08/07/2023 1551   PROT 7.5 03/14/2023 1244   ALBUMIN 4.3 08/07/2023 1551   ALBUMIN 4.7 03/14/2023 1244   AST 16 08/07/2023 1551   ALT 17 08/07/2023 1551   ALKPHOS 86 08/07/2023 1551   BILITOT 0.4 08/07/2023 1551   BILITOT 0.3 03/14/2023 1244      Component Value Date/Time   TSH 0.914 05/12/2022 0958   Nutritional Lab Results  Component Value Date   VD25OH 36.8 03/14/2023   VD25OH 24.8 (L) 11/08/2022   VD25OH 6.9 (L) 05/12/2022    ASSOCIATED CONDITIONS ADDRESSED TODAY  ASSESSMENT AND PLAN  Problem List Items Addressed This Visit     Prediabetes - Primary   Vitamin D deficiency   Obesity (HCC)- starting BMI-43.08   Other hyperlipidemia  Prediabetes Last A1c was ***  Medication(s): {dwwpharmacotherapy:29109} Polyphagia:{dwwyes:29172} Lab Results  Component Value Date   HGBA1C 6.1 (H) 03/14/2023   HGBA1C 6.1 (H) 11/08/2022   HGBA1C 6.0 (H) 05/12/2022   Lab Results  Component Value Date   INSULIN 10.6 11/08/2022   INSULIN 10.8 05/12/2022    Plan: {dwwmed:29123} {dwwpharmacotherapy:29109}  Vitamin D Deficiency Vitamin D {CHL AMB IS/IS NOT:210130109} at goal of 50.  Most recent vitamin D level was ***. She is on {dwwslvitdrx:29084}. Lab Results  Component Value Date   VD25OH 36.8 03/14/2023   VD25OH 24.8 (L) 11/08/2022   VD25OH 6.9 (L) 05/12/2022    Plan: {dwwmed:29123} {dwwslvitdrx:29084}  Hyperlipidemia LDL {ACTION; IS/IS NOT:21021397} at goal. Medication(s): *** Cardiovascular risk factors: {cv risk factors:510}  Lab Results  Component Value Date   CHOL 202 (H) 11/08/2022   HDL 68 11/08/2022   LDLCALC 120 (H) 11/08/2022   TRIG 77 11/08/2022   CHOLHDL 2.7 02/16/2012   Lab Results  Component Value Date   ALT 17 08/07/2023   AST 16 08/07/2023   ALKPHOS 86 08/07/2023   BILITOT 0.4 08/07/2023    The 10-year ASCVD risk score (Arnett DK, et al., 2019) is: 0.8%   Values used to calculate the score:     Age: 19 years     Sex: Female     Is Non-Hispanic African American: Yes     Diabetic: No     Tobacco smoker: No     Systolic Blood Pressure: 114 mmHg     Is BP treated: No     HDL Cholesterol: 68 mg/dL     Total Cholesterol: 202 mg/dL  Plan: Continue statin. ***     ATTESTASTION STATEMENTS:  Reviewed by clinician on day of visit: allergies, medications, problem list, medical history, surgical history, family history, social history, and previous encounter notes.   I have personally spent 30 minutes total time today in preparation, patient care, nutritional counseling and documentation for this visit, including the following: review of clinical lab tests; review of medical tests/procedures/services.      Tiwan Schnitker, PA-C

## 2023-08-14 ENCOUNTER — Other Ambulatory Visit (HOSPITAL_COMMUNITY): Payer: Self-pay

## 2023-08-15 ENCOUNTER — Other Ambulatory Visit (HOSPITAL_COMMUNITY): Payer: Self-pay

## 2023-08-15 ENCOUNTER — Other Ambulatory Visit: Payer: Self-pay

## 2023-08-17 LAB — HELIX MOLECULAR SCREEN: Genetic Analysis Overall Interpretation: NEGATIVE

## 2023-08-18 ENCOUNTER — Other Ambulatory Visit (HOSPITAL_COMMUNITY): Payer: Self-pay

## 2023-08-18 ENCOUNTER — Telehealth: Payer: 59 | Admitting: Family Medicine

## 2023-08-18 DIAGNOSIS — J40 Bronchitis, not specified as acute or chronic: Secondary | ICD-10-CM | POA: Diagnosis not present

## 2023-08-18 DIAGNOSIS — J4540 Moderate persistent asthma, uncomplicated: Secondary | ICD-10-CM

## 2023-08-18 MED ORDER — BENZONATATE 200 MG PO CAPS
200.0000 mg | ORAL_CAPSULE | Freq: Two times a day (BID) | ORAL | 0 refills | Status: DC | PRN
  Filled 2023-08-18: qty 20, 10d supply, fill #0

## 2023-08-18 MED ORDER — AZITHROMYCIN 250 MG PO TABS
ORAL_TABLET | ORAL | 0 refills | Status: AC
Start: 2023-08-18 — End: 2023-08-23
  Filled 2023-08-18: qty 6, 5d supply, fill #0

## 2023-08-18 MED ORDER — PREDNISONE 20 MG PO TABS
20.0000 mg | ORAL_TABLET | Freq: Two times a day (BID) | ORAL | 0 refills | Status: AC
Start: 2023-08-18 — End: 2023-08-23
  Filled 2023-08-18: qty 10, 5d supply, fill #0

## 2023-08-18 NOTE — Progress Notes (Signed)
E-Visit for Cough  We are sorry that you are not feeling well.  Here is how we plan to help!  Based on your presentation I believe you most likely have A cough due to bacteria.  When patients have a fever and a productive cough with a change in color or increased sputum production, we are concerned about bacterial bronchitis.  If left untreated it can progress to pneumonia.  If your symptoms do not improve with your treatment plan it is important that you contact your provider.   I have prescribed Azithromyin 250 mg: two tablets now and then one tablet daily for 4 additonal days    In addition you may use A prescription cough medication called Tessalon Perles 100mg . You may take 1-2 capsules every 8 hours as needed for your cough.  Prednisone 10 mg daily for 6 days (see taper instructions below)  From your responses in the eVisit questionnaire you describe inflammation in the upper respiratory tract which is causing a significant cough.  This is commonly called Bronchitis and has four common causes:   Allergies Viral Infections Acid Reflux Bacterial Infection Allergies, viruses and acid reflux are treated by controlling symptoms or eliminating the cause. An example might be a cough caused by taking certain blood pressure medications. You stop the cough by changing the medication. Another example might be a cough caused by acid reflux. Controlling the reflux helps control the cough.  USE OF BRONCHODILATOR ("RESCUE") INHALERS: There is a risk from using your bronchodilator too frequently.  The risk is that over-reliance on a medication which only relaxes the muscles surrounding the breathing tubes can reduce the effectiveness of medications prescribed to reduce swelling and congestion of the tubes themselves.  Although you feel brief relief from the bronchodilator inhaler, your asthma may actually be worsening with the tubes becoming more swollen and filled with mucus.  This can delay other crucial  treatments, such as oral steroid medications. If you need to use a bronchodilator inhaler daily, several times per day, you should discuss this with your provider.  There are probably better treatments that could be used to keep your asthma under control.     HOME CARE Only take medications as instructed by your medical team. Complete the entire course of an antibiotic. Drink plenty of fluids and get plenty of rest. Avoid close contacts especially the very young and the elderly Cover your mouth if you cough or cough into your sleeve. Always remember to wash your hands A steam or ultrasonic humidifier can help congestion.   GET HELP RIGHT AWAY IF: You develop worsening fever. You become short of breath You cough up blood. Your symptoms persist after you have completed your treatment plan MAKE SURE YOU  Understand these instructions. Will watch your condition. Will get help right away if you are not doing well or get worse.    Thank you for choosing an e-visit.  Your e-visit answers were reviewed by a board certified advanced clinical practitioner to complete your personal care plan. Depending upon the condition, your plan could have included both over the counter or prescription medications.  Please review your pharmacy choice. Make sure the pharmacy is open so you can pick up prescription now. If there is a problem, you may contact your provider through [employer] and have the prescription routed to another pharmacy.  Your safety is important to [place]. If you have drug allergies check your prescription carefully.   For the next 24 hours you can  use MyChart to ask questions about today's visit, request a non-urgent call back, or ask for a work or school excuse. You will get an email in the next two days asking about your experience. I hope that your e-visit has been valuable and will speed your recovery.    have provided 5 minutes of non face to face time during this encounter for  chart review and documentation.

## 2023-10-18 DIAGNOSIS — M25561 Pain in right knee: Secondary | ICD-10-CM | POA: Diagnosis not present

## 2023-10-26 ENCOUNTER — Other Ambulatory Visit (HOSPITAL_COMMUNITY): Payer: Self-pay

## 2023-10-26 MED ORDER — ESCITALOPRAM OXALATE 10 MG PO TABS
10.0000 mg | ORAL_TABLET | Freq: Every day | ORAL | 2 refills | Status: DC
  Filled 2023-10-26: qty 30, 30d supply, fill #0
  Filled 2023-11-29: qty 30, 30d supply, fill #1
  Filled 2024-02-05: qty 30, 30d supply, fill #2

## 2023-11-03 DIAGNOSIS — M25561 Pain in right knee: Secondary | ICD-10-CM | POA: Diagnosis not present

## 2023-11-24 ENCOUNTER — Other Ambulatory Visit (HOSPITAL_COMMUNITY): Payer: Self-pay

## 2023-11-24 ENCOUNTER — Other Ambulatory Visit: Payer: Self-pay

## 2023-11-24 DIAGNOSIS — M25561 Pain in right knee: Secondary | ICD-10-CM | POA: Diagnosis not present

## 2023-11-24 MED ORDER — MELOXICAM 15 MG PO TABS
15.0000 mg | ORAL_TABLET | Freq: Every day | ORAL | 1 refills | Status: DC
  Filled 2023-11-24: qty 30, 30d supply, fill #0
  Filled 2024-02-05 (×2): qty 30, 30d supply, fill #1

## 2023-11-29 ENCOUNTER — Other Ambulatory Visit (HOSPITAL_COMMUNITY): Payer: Self-pay

## 2023-12-04 ENCOUNTER — Telehealth: Payer: Commercial Managed Care - PPO | Admitting: Physician Assistant

## 2023-12-04 DIAGNOSIS — B9689 Other specified bacterial agents as the cause of diseases classified elsewhere: Secondary | ICD-10-CM | POA: Diagnosis not present

## 2023-12-04 DIAGNOSIS — J208 Acute bronchitis due to other specified organisms: Secondary | ICD-10-CM

## 2023-12-04 MED ORDER — DOXYCYCLINE HYCLATE 100 MG PO TABS
100.0000 mg | ORAL_TABLET | Freq: Two times a day (BID) | ORAL | 0 refills | Status: DC
Start: 2023-12-04 — End: 2024-03-11
  Filled 2023-12-04: qty 14, 7d supply, fill #0

## 2023-12-04 MED ORDER — BENZONATATE 100 MG PO CAPS
100.0000 mg | ORAL_CAPSULE | Freq: Three times a day (TID) | ORAL | 0 refills | Status: DC | PRN
Start: 2023-12-04 — End: 2024-03-11
  Filled 2023-12-04: qty 30, 5d supply, fill #0

## 2023-12-04 MED ORDER — PREDNISONE 20 MG PO TABS
40.0000 mg | ORAL_TABLET | Freq: Every day | ORAL | 0 refills | Status: DC
Start: 2023-12-04 — End: 2024-03-11
  Filled 2023-12-04: qty 10, 5d supply, fill #0

## 2023-12-04 NOTE — Progress Notes (Signed)

## 2023-12-05 ENCOUNTER — Other Ambulatory Visit (HOSPITAL_COMMUNITY): Payer: Self-pay

## 2023-12-05 ENCOUNTER — Other Ambulatory Visit: Payer: Self-pay

## 2024-02-05 ENCOUNTER — Other Ambulatory Visit: Payer: Self-pay

## 2024-02-05 ENCOUNTER — Other Ambulatory Visit (HOSPITAL_COMMUNITY): Payer: Self-pay

## 2024-02-07 ENCOUNTER — Other Ambulatory Visit (HOSPITAL_COMMUNITY): Payer: Self-pay

## 2024-02-07 MED ORDER — MELOXICAM 15 MG PO TABS
15.0000 mg | ORAL_TABLET | Freq: Every day | ORAL | 1 refills | Status: DC
  Filled 2024-02-07 – 2024-05-06 (×2): qty 30, 30d supply, fill #0
  Filled 2024-06-18: qty 30, 30d supply, fill #1

## 2024-03-11 ENCOUNTER — Ambulatory Visit (INDEPENDENT_AMBULATORY_CARE_PROVIDER_SITE_OTHER)

## 2024-03-11 ENCOUNTER — Ambulatory Visit (HOSPITAL_COMMUNITY)
Admission: EM | Admit: 2024-03-11 | Discharge: 2024-03-11 | Disposition: A | Discharge status: Home / Self Care | Attending: Emergency Medicine | Admitting: Emergency Medicine

## 2024-03-11 DIAGNOSIS — R052 Subacute cough: Secondary | ICD-10-CM

## 2024-03-11 DIAGNOSIS — J4541 Moderate persistent asthma with (acute) exacerbation: Secondary | ICD-10-CM

## 2024-03-11 DIAGNOSIS — R918 Other nonspecific abnormal finding of lung field: Secondary | ICD-10-CM | POA: Diagnosis not present

## 2024-03-11 DIAGNOSIS — J329 Chronic sinusitis, unspecified: Secondary | ICD-10-CM

## 2024-03-11 DIAGNOSIS — J40 Bronchitis, not specified as acute or chronic: Secondary | ICD-10-CM | POA: Diagnosis not present

## 2024-03-11 DIAGNOSIS — R059 Cough, unspecified: Secondary | ICD-10-CM | POA: Diagnosis not present

## 2024-03-11 MED ORDER — PREDNISONE 10 MG (21) PO TBPK
ORAL_TABLET | ORAL | 0 refills | Status: AC
  Filled 2024-03-11: qty 21, 6d supply, fill #0

## 2024-03-11 MED ORDER — ALBUTEROL SULFATE HFA 108 (90 BASE) MCG/ACT IN AERS
1.0000 | INHALATION_SPRAY | Freq: Four times a day (QID) | RESPIRATORY_TRACT | 0 refills | Status: AC | PRN
Start: 2024-03-11 — End: ?
  Filled 2024-03-11: qty 6.7, 16d supply, fill #0

## 2024-03-11 MED ORDER — AMOXICILLIN-POT CLAVULANATE 875-125 MG PO TABS
1.0000 | ORAL_TABLET | Freq: Two times a day (BID) | ORAL | 0 refills | Status: DC
  Filled 2024-03-11: qty 14, 7d supply, fill #0

## 2024-03-11 MED ORDER — METHYLPREDNISOLONE SODIUM SUCC 125 MG IJ SOLR
INTRAMUSCULAR | Status: AC
  Filled 2024-03-11: qty 2

## 2024-03-11 MED ORDER — METHYLPREDNISOLONE SODIUM SUCC 125 MG IJ SOLR
80.0000 mg | Freq: Once | INTRAMUSCULAR | Status: AC
  Administered 2024-03-11: 80 mg via INTRAMUSCULAR

## 2024-03-11 MED ORDER — IPRATROPIUM-ALBUTEROL 0.5-2.5 (3) MG/3ML IN SOLN
RESPIRATORY_TRACT | Status: AC
  Filled 2024-03-11: qty 3

## 2024-03-11 MED ORDER — BUDESONIDE-FORMOTEROL FUMARATE 160-4.5 MCG/ACT IN AERO
2.0000 | INHALATION_SPRAY | Freq: Two times a day (BID) | RESPIRATORY_TRACT | 0 refills | Status: AC | PRN
Start: 2024-03-11 — End: ?
  Filled 2024-03-11: qty 10.2, 30d supply, fill #0

## 2024-03-11 MED ORDER — IPRATROPIUM-ALBUTEROL 0.5-2.5 (3) MG/3ML IN SOLN
3.0000 mL | Freq: Once | RESPIRATORY_TRACT | Status: AC
  Administered 2024-03-11: 3 mL via RESPIRATORY_TRACT

## 2024-03-11 NOTE — ED Provider Notes (Signed)
 [facility]    CSN: [id number] Arrival date & time: 03/11/24  4098      History   Chief Complaint Chief Complaint  Patient presents with   Cough    HPI Stacey Carlson is a 48 y.o. female.   Patient presents today with a several month history of recurrent/persistent cough.  She reports initially she had some congestion as well but this has since improved.  She has tried multiple over-the-counter medication including Vicks cold and flu medicine, Robitussin, Delsym without improvement of symptoms.  She participated in 2 different video visits and was prescribed azithromycin  and Tessalon  that provide temporary movement of symptoms.  She has an albuterol  inhaler that she has been using regularly which provides minimal improvement.  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  She denies any recent antibiotics or steroids.  She is confident that she is not pregnant.  She does have seasonal allergies and has been taking over-the-counter medication without improvement of symptoms.  Her cough has worsened and anytime she laughs or talks she ends up with a severe cough and it is impacting her ability to sleep at night.    Past Medical History:  Diagnosis Date   Anemia    Asthma    rarely uses inhaler - exercise induced    Complication of anesthesia    slow to awaken and feels bad, postop nausea no vomiting , almost passed out after sx with dr Dwight Givens   COVID 11/13/2019   asymptomatic per pt   Depression    Fibroid    Fibroids    History of gestational diabetes    Infertility, female    Murmur    mild, no cardiologist currently saw cardiology 3-4 yrs ago and released   Palpitations    PIH (pregnancy induced hypertension)    no meds for last 2 months   Preeclampsia    Seasonal allergies    Single delivery by cesarean section 11/15/2019   SVD (spontaneous vaginal  delivery)    x 1    Patient Active Problem List   Diagnosis Date Noted   Knee pain 06/11/2023   BMI 40.0-44.9, adult (HCC), Current BMI 41.7 12/06/2022   Obesity (HCC)- starting BMI-43.08 12/06/2022   Other hyperlipidemia 12/06/2022   Prediabetes 07/12/2022   Vitamin D  deficiency 07/12/2022   Major depressive disorder 07/12/2022   Elevated BP without diagnosis of hypertension 07/12/2022   Indication for care in labor or delivery 11/15/2019   Single delivery by cesarean section 11/15/2019   COVID-19 11/14/2019   Ulcer of toe of left foot (HCC) 08/15/2018   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   Irritation of left eye 05/24/2018   Encounter for fertility planning 07/13/2015   ASCUS with positive high risk HPV 03/19/2015   Well woman exam 02/21/2012   Palpitations 02/03/2012   PVC (premature ventricular contraction) 02/03/2012   History of AGUS pap 2012 10/15/2009   Obesity, unspecified 08/21/2009   Allergic rhinitis 08/21/2009    Past Surgical History:  Procedure Laterality Date   BREAST BIOPSY Left 03/17/2023   US  LT BREAST BX W LOC DEV 1ST LESION IMG BX SPEC US  GUIDE 03/17/2023 [facility]   CESAREAN SECTION N/A 11/15/2019   Procedure: CESAREAN SECTION;  Surgeon: Loa Riling, DO;  Location: [facility];  Service: Obstetrics;  Laterality: N/APattie Borders, RNFA   CHROMOPERTUBATION  03/16/2016   Procedure:  CHROMOPERTUBATION;  Surgeon: Asa Bjork, MD;  Location: [facility];  Service: Gynecology;;   DILITATION & CURRETTAGE/HYSTROSCOPY WITH NOVASURE ABLATION N/A 10/07/2020   Procedure: DILATATION & CURETTAGE/HYSTEROSCOPY WITH NOVASURE ABLATION, CERVICAL POLPYECTOMY;  Surgeon: Loa Riling, DO;  Location: [facility];  Service: Gynecology;  Laterality: N/A;   KNEE SURGERY Right yrs ago   x 2   LAPAROSCOPY  03/16/2016   Procedure: LAPAROSCOPY OPERATIVE;  Surgeon: Asa Bjork, MD;  Location: [facility];  Service: Gynecology;;   Dionne Frederick  03/16/2016    Procedure: MYOMECTOMY;  Surgeon: Asa Bjork, MD;  Location: [facility];  Service: Gynecology;;   MYOMECTOMY N/A 11/15/2019   Procedure: ABDOMINAL MYOMECTOMY;  Surgeon: Loa Riling, DO;  Location: [facility];  Service: Obstetrics;  Laterality: N/A;   WISDOM TOOTH EXTRACTION  yrs ago    OB History     Gravida  3   Para  2   Term  2   Preterm      AB  1   Living  2      SAB  1   IAB      Ectopic      Multiple  0   Live Births  2            Home Medications    Prior to Admission medications   Medication Sig Start Date End Date Taking? Authorizing Provider  albuterol  (VENTOLIN  HFA) 108 (90 Base) MCG/ACT inhaler Inhale 1-2 puffs into the lungs every 6 (six) hours as needed for wheezing or shortness of breath. 03/11/24  Yes Devona Holmes K, PA-C  amoxicillin-clavulanate (AUGMENTIN) 875-125 MG tablet Take 1 tablet by mouth every 12 (twelve) hours. 03/11/24  Yes Trayce Caravello, [place] Dales K, PA-C  budesonide-formoterol (SYMBICORT) 160-4.5 MCG/ACT inhaler Inhale 2 puffs into the lungs 2 (two) times daily as needed. 03/11/24  Yes Shanicka Oldenkamp K, PA-C  predniSONE  (STERAPRED UNI-PAK 21 TAB) 10 MG (21) TBPK tablet As directed 03/11/24  Yes Dovid Bartko K, PA-C  escitalopram  (LEXAPRO ) 10 MG tablet Take 1 tablet (10 mg total) by mouth daily as directed 10/26/23     fluticasone  (FLONASE ) 50 MCG/ACT nasal spray Place 1 spray into both nostrils daily.    [provider]  meloxicam  (MOBIC ) 15 MG tablet Take 1 tablet (15 mg total) by mouth daily with a meal. 11/24/23     meloxicam  (MOBIC ) 15 MG tablet Take 1 tablet (15 mg total) by mouth daily with a meal. 02/07/24     metFORMIN  (GLUCOPHAGE ) 500 MG tablet Take 1 tablet (500 mg total) by mouth 2 (two) times daily with a meal. 07/12/23   Rayburn, Evangelina Hilt, PA-C  polyethylene glycol (GOLYTELY ) 236 g solution Use as directed - see administration instructions from your provider 03/24/23     Vitamin D , Ergocalciferol , (DRISDOL ) 1.25 MG  (50000 UNIT) CAPS capsule Take 1 capsule (50,000 Units total) by mouth every 7 (seven) days. 07/12/23   Rayburn, Evangelina Hilt, PA-C  sertraline  (ZOLOFT ) 50 MG tablet Take 1 tablet (50 mg total) by mouth daily as directed 01/12/22 06/09/22      Family History Family History  Problem Relation Age of Onset   Hypertension Mother    Diabetes Mother    Depression Mother    Anxiety disorder Mother    Sleep apnea Mother    Cancer Father    Stroke Father    Hypertension Father    Diabetes Father    Kidney disease Father    Seizures Sister  Kidney disease Sister    Heart disease Maternal Grandmother    COPD Maternal Grandfather    Stroke Paternal Grandmother    Diabetes Paternal Grandfather     Social History Social History   Tobacco Use   Smoking status: Never   Smokeless tobacco: Never  Vaping Use   Vaping status: Never Used  Substance Use Topics   Alcohol use: No   Drug use: No     Allergies   Latex   Review of Systems Review of Systems  Constitutional:  Positive for activity change. Negative for appetite change, fatigue and fever.  HENT:  Positive for congestion. Negative for sinus pressure, sneezing and sore throat.   Respiratory:  Positive for cough, chest tightness and shortness of breath. Negative for wheezing.   Cardiovascular:  Negative for chest pain.  Gastrointestinal:  Negative for abdominal pain, diarrhea, nausea and vomiting.  Neurological:  Negative for dizziness, light-headedness and headaches.     Physical Exam Triage Vital Signs ED Triage Vitals  Encounter Vitals Group     BP 03/11/24 1923 136/88     Systolic BP Percentile --      Diastolic BP Percentile --      Pulse Rate 03/11/24 1923 81     Resp 03/11/24 1923 16     Temp 03/11/24 1923 97.9 F (36.6 C)     Temp Source 03/11/24 1923 Oral     SpO2 03/11/24 1923 97 %     Weight --      Height --      Head Circumference --      Peak Flow --      Pain Score 03/11/24 1925 0     Pain Loc  --      Pain Education --      Exclude from Growth Chart --    No data found.  Updated Vital Signs BP 136/88 (BP Location: Left Arm)   Pulse 81   Temp 97.9 F (36.6 C) (Oral)   Resp 16   SpO2 97%   Visual Acuity Right Eye Distance:   Left Eye Distance:   Bilateral Distance:    Right Eye Near:   Left Eye Near:    Bilateral Near:     Physical Exam Vitals reviewed.  Constitutional:      General: She is awake. She is not in acute distress.    Appearance: Normal appearance. She is well-developed. She is not ill-appearing.     Comments: Very pleasant female appears stated age in no acute distress sitting comfortably in exam room  HENT:     Head: Normocephalic and atraumatic.     Right Ear: Tympanic membrane, ear canal and external ear normal. Tympanic membrane is not erythematous or bulging.     Left Ear: Tympanic membrane, ear canal and external ear normal. Tympanic membrane is not erythematous or bulging.     Nose:     Right Sinus: No maxillary sinus tenderness or frontal sinus tenderness.     Left Sinus: No maxillary sinus tenderness or frontal sinus tenderness.     Mouth/Throat:     Pharynx: Uvula midline. No oropharyngeal exudate or posterior oropharyngeal erythema.  Cardiovascular:     Rate and Rhythm: Normal rate and regular rhythm.     Heart sounds: Normal heart sounds, S1 normal and S2 normal. No murmur heard. Pulmonary:     Effort: Pulmonary effort is normal.     Breath sounds: Wheezing present. No rhonchi or rales.  Comments: Widespread wheezing with reactive cough with deep breathing Psychiatric:        Behavior: Behavior is cooperative.      UC Treatments / Results  Labs (all labs ordered are listed, but only abnormal results are displayed) Labs Reviewed - No data to display  EKG   Radiology DG Chest 2 View Result Date: 03/11/2024 CLINICAL DATA:  Cough for 4 months. EXAM: CHEST - 2 VIEW COMPARISON:  07/11/2017 FINDINGS: The cardiomediastinal  contours are normal. Diffuse bronchial thickening. Pulmonary vasculature is normal. No consolidation, pleural effusion, or pneumothorax. No acute osseous abnormalities are seen. IMPRESSION: Diffuse bronchial thickening suggesting bronchitis or reactive airways disease. Electronically Signed   By: Chadwick Colonel M.D.   On: 03/11/2024 20:08    Procedures Procedures (including critical care time)  Medications Ordered in UC Medications  ipratropium-albuterol  (DUONEB) 0.5-2.5 (3) MG/3ML nebulizer solution 3 mL (3 mLs Nebulization Given 03/11/24 2013)  methylPREDNISolone sodium succinate (SOLU-MEDROL) 125 mg/2 mL injection 80 mg (80 mg Intramuscular Given 03/11/24 2013)    Initial Impression / Assessment and Plan / UC Course  I have reviewed the triage vital signs and the nursing notes.  Pertinent labs & imaging results that were available during my care of the patient were reviewed by me and considered in my medical decision making (see chart for details).     Patient is well-appearing, afebrile, nontoxic, nontachycardic.  Chest x-ray was obtained given persistent cough that showed bronchial thickening concerning for reactive airway disease.  She was given a DuoNeb and 80 mg of Solu-Medrol in clinic with improvement of symptoms.  Given her persistent and worsening symptoms with associated productive cough will cover with Augmentin as she has only had atypical coverage to this point in her treatment but I am more concerned about an asthma exacerbation is causing her symptoms.  Will start Symbicort twice daily we discussed that she should rinse her mouth following use of this medication.  Refill of albuterol  sent to pharmacy to be used as needed every 4-6 hours.  Will also start prednisone  taper tomorrow (03/12/2024) given she had an injection of Solu-Medrol in clinic.  We discussed that she is not to take NSAIDs with this medication but can use Tylenol , Mucinex, Flonase  for additional symptom relief.  If  your symptoms are not improving quickly or if anything worsens she needs to be seen emergently.  Recommend close follow-up with her primary care.  Strict return precautions given to which she expressed understanding.  Excuse note declined by the patient.  Final Clinical Impressions(s) / UC Diagnoses   Final diagnoses:  Subacute cough  Moderate persistent asthma with acute exacerbation  Sinobronchitis     Discharge Instructions      There was no pneumonia on your x-ray.  We are going to treat for a sinus/bronchitis infection with Augmentin twice daily for 7 days.  I am more concerned about an asthma exacerbation.  Please continue your albuterol  every 4-6 hours as needed.  Start prednisone  tomorrow (03/12/2024).  Do not take NSAIDs with this medication including aspirin, ibuprofen /Advil , naproxen/Aleve.  Start Symbicort twice daily.  Rinse your mouth following use of this medication to prevent thrush.  You can use over-the-counter medicine including Mucinex, Flonase , Tylenol , nasal saline/sinus rinses.  Make sure that you rest and drink plenty of fluid.  If you are not feeling better within a few days please return for reevaluation.  If anything worsens and you have shortness of breath, chest pain, fever, worsening cough you need to  be seen immediately.   ED Prescriptions     Medication Sig Dispense Auth. Provider   amoxicillin-clavulanate (AUGMENTIN) 875-125 MG tablet Take 1 tablet by mouth every 12 (twelve) hours. 14 tablet Tamer Baughman K, PA-C   predniSONE  (STERAPRED UNI-PAK 21 TAB) 10 MG (21) TBPK tablet As directed 21 tablet Belissa Kooy K, PA-C   albuterol  (VENTOLIN  HFA) 108 (90 Base) MCG/ACT inhaler Inhale 1-2 puffs into the lungs every 6 (six) hours as needed for wheezing or shortness of breath. 18 g Ahlam Piscitelli K, PA-C   budesonide-formoterol (SYMBICORT) 160-4.5 MCG/ACT inhaler Inhale 2 puffs into the lungs 2 (two) times daily as needed. 1 each Haruo Stepanek, Betsey Brow, PA-C      PDMP not  reviewed this encounter.   Budd Cargo, PA-C 03/11/24 2033

## 2024-03-11 NOTE — Discharge Instructions (Signed)
 There was no pneumonia on your x-ray.  We are going to treat for a sinus/bronchitis infection with Augmentin twice daily for 7 days.  I am more concerned about an asthma exacerbation.  Please continue your albuterol  every 4-6 hours as needed.  Start prednisone  tomorrow (03/12/2024).  Do not take NSAIDs with this medication including aspirin, ibuprofen /Advil , naproxen/Aleve.  Start Symbicort twice daily.  Rinse your mouth following use of this medication to prevent thrush.  You can use over-the-counter medicine including Mucinex, Flonase , Tylenol , nasal saline/sinus rinses.  Make sure that you rest and drink plenty of fluid.  If you are not feeling better within a few days please return for reevaluation.  If anything worsens and you have shortness of breath, chest pain, fever, worsening cough you need to be seen immediately.

## 2024-03-11 NOTE — ED Triage Notes (Signed)
 Patient reports that she has a non productive cough and nasal congestion x 4 months. Patient wants to make sure she does not have pneumonia.  Patient states she had az-pack and Tessalon  [place] approx 2 months ago and recently and has been taking Day/night cough medication, Delsym.

## 2024-03-12 ENCOUNTER — Other Ambulatory Visit: Payer: Self-pay

## 2024-03-12 ENCOUNTER — Other Ambulatory Visit (HOSPITAL_COMMUNITY): Payer: Self-pay

## 2024-04-08 DIAGNOSIS — Z1389 Encounter for screening for other disorder: Secondary | ICD-10-CM | POA: Diagnosis not present

## 2024-04-08 DIAGNOSIS — Z1231 Encounter for screening mammogram for malignant neoplasm of breast: Secondary | ICD-10-CM | POA: Diagnosis not present

## 2024-04-08 DIAGNOSIS — Z130 Encounter for screening for diseases of the blood and blood-forming organs and certain disorders involving the immune mechanism: Secondary | ICD-10-CM | POA: Diagnosis not present

## 2024-04-08 DIAGNOSIS — Z01419 Encounter for gynecological examination (general) (routine) without abnormal findings: Secondary | ICD-10-CM | POA: Diagnosis not present

## 2024-04-09 DIAGNOSIS — Z01419 Encounter for gynecological examination (general) (routine) without abnormal findings: Secondary | ICD-10-CM | POA: Diagnosis not present

## 2024-05-06 ENCOUNTER — Other Ambulatory Visit: Payer: Self-pay

## 2024-05-06 ENCOUNTER — Other Ambulatory Visit (HOSPITAL_COMMUNITY): Payer: Self-pay

## 2024-05-07 ENCOUNTER — Other Ambulatory Visit (HOSPITAL_COMMUNITY): Payer: Self-pay

## 2024-05-07 MED ORDER — ESCITALOPRAM OXALATE 10 MG PO TABS
10.0000 mg | ORAL_TABLET | Freq: Every day | ORAL | 2 refills | Status: DC
  Filled 2024-05-07: qty 30, 30d supply, fill #0
  Filled 2024-06-18: qty 30, 30d supply, fill #1
  Filled 2024-08-14 (×2): qty 30, 30d supply, fill #2

## 2024-05-20 DIAGNOSIS — Z1331 Encounter for screening for depression: Secondary | ICD-10-CM | POA: Diagnosis not present

## 2024-05-20 DIAGNOSIS — Z6841 Body Mass Index (BMI) 40.0 and over, adult: Secondary | ICD-10-CM | POA: Diagnosis not present

## 2024-05-21 ENCOUNTER — Encounter (HOSPITAL_COMMUNITY): Payer: Self-pay

## 2024-05-21 ENCOUNTER — Other Ambulatory Visit (HOSPITAL_COMMUNITY): Payer: Self-pay

## 2024-05-21 MED ORDER — ZEPBOUND 5 MG/0.5ML ~~LOC~~ SOAJ
5.0000 mg | SUBCUTANEOUS | 2 refills | Status: DC
Start: 2024-05-20 — End: 2024-05-30

## 2024-05-21 MED ORDER — ZEPBOUND 2.5 MG/0.5ML ~~LOC~~ SOAJ
2.5000 mg | SUBCUTANEOUS | 1 refills | Status: DC
Start: 2024-05-20 — End: 2024-05-30

## 2024-05-30 ENCOUNTER — Other Ambulatory Visit (HOSPITAL_COMMUNITY): Payer: Self-pay

## 2024-05-30 MED ORDER — WEGOVY 0.25 MG/0.5ML ~~LOC~~ SOAJ
0.2500 mg | SUBCUTANEOUS | 0 refills | Status: AC
Start: 2024-05-30 — End: ?
  Filled 2024-05-30: qty 2, 28d supply, fill #0

## 2024-05-30 MED ORDER — WEGOVY 0.5 MG/0.5ML ~~LOC~~ SOAJ: 0.5000 mg | SUBCUTANEOUS | 0 refills | Status: AC

## 2024-05-31 ENCOUNTER — Other Ambulatory Visit: Payer: Self-pay

## 2024-06-11 ENCOUNTER — Other Ambulatory Visit (HOSPITAL_COMMUNITY): Payer: Self-pay

## 2024-06-17 DIAGNOSIS — Z6841 Body Mass Index (BMI) 40.0 and over, adult: Secondary | ICD-10-CM | POA: Diagnosis not present

## 2024-06-17 DIAGNOSIS — Z713 Dietary counseling and surveillance: Secondary | ICD-10-CM | POA: Diagnosis not present

## 2024-06-21 ENCOUNTER — Other Ambulatory Visit (HOSPITAL_COMMUNITY): Payer: Self-pay

## 2024-06-21 DIAGNOSIS — M25562 Pain in left knee: Secondary | ICD-10-CM | POA: Diagnosis not present

## 2024-06-21 MED ORDER — METHYLPREDNISOLONE 4 MG PO TBPK
ORAL_TABLET | ORAL | 0 refills | Status: DC
  Filled 2024-06-21 (×2): qty 21, 6d supply, fill #0

## 2024-06-25 DIAGNOSIS — Z6841 Body Mass Index (BMI) 40.0 and over, adult: Secondary | ICD-10-CM | POA: Diagnosis not present

## 2024-06-25 DIAGNOSIS — Z713 Dietary counseling and surveillance: Secondary | ICD-10-CM | POA: Diagnosis not present

## 2024-07-04 DIAGNOSIS — M25562 Pain in left knee: Secondary | ICD-10-CM | POA: Diagnosis not present

## 2024-07-08 DIAGNOSIS — M25562 Pain in left knee: Secondary | ICD-10-CM | POA: Diagnosis not present

## 2024-07-08 DIAGNOSIS — M25561 Pain in right knee: Secondary | ICD-10-CM | POA: Diagnosis not present

## 2024-07-09 DIAGNOSIS — Z6841 Body Mass Index (BMI) 40.0 and over, adult: Secondary | ICD-10-CM | POA: Diagnosis not present

## 2024-07-09 DIAGNOSIS — Z713 Dietary counseling and surveillance: Secondary | ICD-10-CM | POA: Diagnosis not present

## 2024-08-14 ENCOUNTER — Other Ambulatory Visit (HOSPITAL_COMMUNITY): Payer: Self-pay

## 2024-08-14 MED ORDER — MELOXICAM 15 MG PO TABS
15.0000 mg | ORAL_TABLET | Freq: Every day | ORAL | 1 refills | Status: DC
  Filled 2024-08-14: qty 30, 30d supply, fill #0
  Filled 2024-09-20: qty 30, 30d supply, fill #1

## 2024-08-15 ENCOUNTER — Other Ambulatory Visit: Payer: Self-pay

## 2024-08-21 ENCOUNTER — Other Ambulatory Visit (HOSPITAL_COMMUNITY): Payer: Self-pay

## 2024-08-21 DIAGNOSIS — T1511XA Foreign body in conjunctival sac, right eye, initial encounter: Secondary | ICD-10-CM | POA: Diagnosis not present

## 2024-08-21 MED ORDER — POLYMYXIN B-TRIMETHOPRIM 10000-0.1 UNIT/ML-% OP SOLN
1.0000 [drp] | Freq: Four times a day (QID) | OPHTHALMIC | 0 refills | Status: DC
  Filled 2024-08-21: qty 10, 30d supply, fill #0

## 2024-09-20 ENCOUNTER — Other Ambulatory Visit: Payer: Self-pay

## 2024-09-20 ENCOUNTER — Other Ambulatory Visit (INDEPENDENT_AMBULATORY_CARE_PROVIDER_SITE_OTHER): Payer: Self-pay | Admitting: Physician Assistant

## 2024-09-20 ENCOUNTER — Other Ambulatory Visit (HOSPITAL_COMMUNITY): Payer: Self-pay

## 2024-09-20 DIAGNOSIS — R7303 Prediabetes: Secondary | ICD-10-CM

## 2024-09-23 ENCOUNTER — Other Ambulatory Visit (HOSPITAL_COMMUNITY): Payer: Self-pay

## 2024-09-23 ENCOUNTER — Telehealth: Admitting: Physician Assistant

## 2024-09-23 ENCOUNTER — Encounter (HOSPITAL_COMMUNITY): Payer: Self-pay

## 2024-09-23 DIAGNOSIS — R059 Cough, unspecified: Secondary | ICD-10-CM

## 2024-09-23 NOTE — Progress Notes (Signed)
 I sent a reply to pt's questionnaire answers and have not heard back for several hours. I will close this encounter.

## 2024-09-24 ENCOUNTER — Other Ambulatory Visit (HOSPITAL_COMMUNITY): Payer: Self-pay

## 2024-09-24 MED ORDER — ESCITALOPRAM OXALATE 10 MG PO TABS
10.0000 mg | ORAL_TABLET | Freq: Every day | ORAL | 2 refills | Status: AC
  Filled 2024-09-24 – 2024-10-15 (×2): qty 30, 30d supply, fill #0

## 2024-10-02 ENCOUNTER — Other Ambulatory Visit (HOSPITAL_COMMUNITY): Payer: Self-pay

## 2024-10-11 ENCOUNTER — Other Ambulatory Visit (HOSPITAL_COMMUNITY): Payer: Self-pay

## 2024-10-11 DIAGNOSIS — M170 Bilateral primary osteoarthritis of knee: Secondary | ICD-10-CM | POA: Diagnosis not present

## 2024-10-11 MED ORDER — DICLOFENAC SODIUM 75 MG PO TBEC
75.0000 mg | DELAYED_RELEASE_TABLET | Freq: Two times a day (BID) | ORAL | 2 refills | Status: AC
  Filled 2024-10-11 – 2024-11-01 (×2): qty 30, 15d supply, fill #0

## 2024-10-15 ENCOUNTER — Other Ambulatory Visit (HOSPITAL_COMMUNITY): Payer: Self-pay

## 2024-10-15 ENCOUNTER — Encounter (HOSPITAL_COMMUNITY): Payer: Self-pay

## 2024-10-15 ENCOUNTER — Ambulatory Visit
Admission: EM | Admit: 2024-10-15 | Discharge: 2024-10-15 | Disposition: A | Discharge status: Home / Self Care | Source: Home / Self Care | Attending: Family Medicine | Admitting: Family Medicine

## 2024-10-15 ENCOUNTER — Other Ambulatory Visit (INDEPENDENT_AMBULATORY_CARE_PROVIDER_SITE_OTHER): Payer: Self-pay | Admitting: Physician Assistant

## 2024-10-15 DIAGNOSIS — J019 Acute sinusitis, unspecified: Secondary | ICD-10-CM

## 2024-10-15 DIAGNOSIS — J4521 Mild intermittent asthma with (acute) exacerbation: Secondary | ICD-10-CM | POA: Diagnosis not present

## 2024-10-15 DIAGNOSIS — R7303 Prediabetes: Secondary | ICD-10-CM

## 2024-10-15 MED ORDER — DOXYCYCLINE HYCLATE 100 MG PO CAPS
100.0000 mg | ORAL_CAPSULE | Freq: Two times a day (BID) | ORAL | 0 refills | Status: AC
  Filled 2024-10-15: qty 14, 7d supply, fill #0

## 2024-10-15 MED ORDER — PREDNISONE 20 MG PO TABS
40.0000 mg | ORAL_TABLET | Freq: Every day | ORAL | 0 refills | Status: AC
  Filled 2024-10-15: qty 10, 5d supply, fill #0

## 2024-10-15 MED ORDER — BENZONATATE 100 MG PO CAPS
100.0000 mg | ORAL_CAPSULE | Freq: Three times a day (TID) | ORAL | 0 refills | Status: AC | PRN
  Filled 2024-10-15: qty 21, 7d supply, fill #0

## 2024-10-15 NOTE — ED Triage Notes (Signed)
 Pt present coughing, symptoms started back in October. Pt states she cough so bad she gets an headache. Pt tried OTC medication with no relief.

## 2024-10-15 NOTE — Discharge Instructions (Signed)
 Take doxycycline 100 mg --1 capsule 2 times daily for 7 days  Take prednisone 20 mg--2 daily for 5 days  Take benzonatate 100 mg, 1 tab every 8 hours as needed for cough.

## 2024-10-15 NOTE — ED Provider Notes (Signed)
 [facility]    CSN: [id number] Arrival date & time: 10/15/24  1539      History   Chief Complaint Chief Complaint  Patient presents with   Cough    HPI Stacey Carlson is a 48 y.o. female.    Cough  Here for cough and nasal congestion and wheezing.  She is also had some headache.  Symptoms began in October.  She states every year she has trouble with something similar.  She has not had any fever at any point.  She did have 1 episode of emesis but that was a few days ago.  She is allergic to latex.  Last menstrual cycle was a while ago.  She has had an ablation.  Past Medical History:  Diagnosis Date   Anemia    Asthma    rarely uses inhaler - exercise induced    Complication of anesthesia    slow to awaken and feels bad, postop nausea no vomiting , almost passed out after sx with dr alleen   COVID 11/13/2019   asymptomatic per pt   Depression    Fibroid    Fibroids    History of gestational diabetes    Infertility, female    Murmur    mild, no cardiologist currently saw cardiology 3-4 yrs ago and released   Palpitations    PIH (pregnancy induced hypertension)    no meds for last 2 months   Preeclampsia    Seasonal allergies    Single delivery by cesarean section 11/15/2019   SVD (spontaneous vaginal delivery)    x 1    Patient Active Problem List   Diagnosis Date Noted   Knee pain 06/11/2023   BMI 40.0-44.9, adult (HCC), Current BMI 41.7 12/06/2022   Obesity (HCC)- starting BMI-43.08 12/06/2022   Other hyperlipidemia 12/06/2022   Prediabetes 07/12/2022   Vitamin D  deficiency 07/12/2022   Major depressive disorder 07/12/2022   Elevated BP without diagnosis of hypertension 07/12/2022   Indication for care in labor or delivery 11/15/2019   Single delivery by cesarean section 11/15/2019   COVID-19 11/14/2019   Ulcer of toe of left foot (HCC) 08/15/2018   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   Irritation of left eye 05/24/2018   Encounter  for fertility planning 07/13/2015   ASCUS with positive high risk HPV 03/19/2015   Well woman exam 02/21/2012   Palpitations 02/03/2012   PVC (premature ventricular contraction) 02/03/2012   History of AGUS pap 2012 10/15/2009   Obesity, unspecified 08/21/2009   Allergic rhinitis 08/21/2009    Past Surgical History:  Procedure Laterality Date   BREAST BIOPSY Left 03/17/2023   US  LT BREAST BX W LOC DEV 1ST LESION IMG BX SPEC US  GUIDE 03/17/2023 [facility]   CESAREAN SECTION N/A 11/15/2019   Procedure: CESAREAN SECTION;  Surgeon: Delana Ted Morrison, DO;  Location: [facility];  Service: Obstetrics;  Laterality: N/AMERL Moats, RNFA   CHROMOPERTUBATION  03/16/2016   Procedure: CHROMOPERTUBATION;  Surgeon: Cynthia Loss, MD;  Location: [facility];  Service: Gynecology;;   DILITATION & CURRETTAGE/HYSTROSCOPY WITH NOVASURE ABLATION N/A 10/07/2020   Procedure: DILATATION & CURETTAGE/HYSTEROSCOPY WITH NOVASURE ABLATION, CERVICAL POLPYECTOMY;  Surgeon: Delana Ted Morrison, DO;  Location: [facility];  Service: Gynecology;  Laterality: N/A;   KNEE SURGERY Right yrs ago   x 2   LAPAROSCOPY  03/16/2016   Procedure: LAPAROSCOPY OPERATIVE;  Surgeon: Cynthia Loss, MD;  Location: [facility];  Service: Gynecology;;   LEANORA  03/16/2016   Procedure: MYOMECTOMY;  Surgeon: Cynthia Loss, MD;  Location: [facility];  Service: Gynecology;;   MYOMECTOMY N/A 11/15/2019   Procedure: ABDOMINAL MYOMECTOMY;  Surgeon: Delana Ted Morrison, DO;  Location: [facility];  Service: Obstetrics;  Laterality: N/A;   WISDOM TOOTH EXTRACTION  yrs ago    OB History     Gravida  3   Para  2   Term  2   Preterm      AB  1   Living  2      SAB  1   IAB      Ectopic      Multiple  0   Live Births  2            Home Medications    Prior to Admission medications  Medication Sig Start Date End Date Taking? Authorizing Provider  benzonatate  (TESSALON ) 100 MG capsule Take 1 capsule  (100 mg total) by mouth 3 (three) times daily as needed for cough. 10/15/24  Yes Vonna Sharlet POUR, MD  doxycycline  (VIBRAMYCIN ) 100 MG capsule Take 1 capsule (100 mg total) by mouth 2 (two) times daily for 7 days. 10/15/24 10/22/24 Yes Tehya Leath, Sharlet POUR, MD  predniSONE  (DELTASONE ) 20 MG tablet Take 2 tablets (40 mg total) by mouth daily with breakfast for 5 days. 10/15/24 10/20/24 Yes Vonna Sharlet POUR, MD  albuterol  (VENTOLIN  HFA) 108 (90 Base) MCG/ACT inhaler Inhale 1-2 puffs into the lungs every 6 (six) hours as needed for wheezing or shortness of breath. 03/11/24   Raspet, Erin K, PA-C  budesonide -formoterol  (SYMBICORT ) 160-4.5 MCG/ACT inhaler Inhale 2 puffs into the lungs 2 (two) times daily as needed. 03/11/24   Raspet, Erin K, PA-C  diclofenac  (VOLTAREN ) 75 MG EC tablet Take 1 tablet (75 mg total) by mouth 2 (two) times daily with food. 10/11/24     escitalopram  (LEXAPRO ) 10 MG tablet Take 1 tablet (10 mg total) by mouth daily as directed. 09/24/24     fluticasone  (FLONASE ) 50 MCG/ACT nasal spray Place 1 spray into both nostrils daily.    [provider]  metFORMIN  (GLUCOPHAGE ) 500 MG tablet Take 1 tablet (500 mg total) by mouth 2 (two) times daily with a meal. 07/12/23   Rayburn, Elouise Phlegm, PA-C  polyethylene glycol (GOLYTELY ) 236 g solution Use as directed - see administration instructions from your provider 03/24/23     Semaglutide -Weight Management (WEGOVY ) 0.25 MG/0.5ML SOAJ Inject 0.25 mg into the skin once a week. 05/30/24     Semaglutide -Weight Management (WEGOVY ) 0.5 MG/0.5ML SOAJ Inject 0.5 mg into the skin once a week. 05/30/24     Vitamin D , Ergocalciferol , (DRISDOL ) 1.25 MG (50000 UNIT) CAPS capsule Take 1 capsule (50,000 Units total) by mouth every 7 (seven) days. 07/12/23   Rayburn, Elouise Phlegm, PA-C  sertraline  (ZOLOFT ) 50 MG tablet Take 1 tablet (50 mg total) by mouth daily as directed 01/12/22 06/09/22      Family History Family History  Problem Relation Age  of Onset   Hypertension Mother    Diabetes Mother    Depression Mother    Anxiety disorder Mother    Sleep apnea Mother    Cancer Father    Stroke Father    Hypertension Father    Diabetes Father    Kidney disease Father    Seizures Sister    Kidney disease Sister    Heart disease Maternal Grandmother    COPD Maternal Grandfather    Stroke Paternal Grandmother  Diabetes Paternal Grandfather     Social History Social History[1]   Allergies   Latex   Review of Systems Review of Systems  Respiratory:  Positive for cough.      Physical Exam Triage Vital Signs ED Triage Vitals  Encounter Vitals Group     BP 10/15/24 1731 (!) 143/84     Girls Systolic BP Percentile --      Girls Diastolic BP Percentile --      Boys Systolic BP Percentile --      Boys Diastolic BP Percentile --      Pulse Rate 10/15/24 1731 87     Resp 10/15/24 1731 18     Temp 10/15/24 1731 98.4 F (36.9 C)     Temp Source 10/15/24 1731 Oral     SpO2 10/15/24 1731 99 %     Weight --      Height --      Head Circumference --      Peak Flow --      Pain Score 10/15/24 1730 0     Pain Loc --      Pain Education --      Exclude from Growth Chart --    No data found.  Updated Vital Signs BP (!) 143/84 (BP Location: Right Arm)   Pulse 87   Temp 98.4 F (36.9 C) (Oral)   Resp 18   SpO2 99%   Visual Acuity Right Eye Distance:   Left Eye Distance:   Bilateral Distance:    Right Eye Near:   Left Eye Near:    Bilateral Near:     Physical Exam Vitals reviewed.  Constitutional:      General: She is not in acute distress.    Appearance: She is not toxic-appearing.  HENT:     Right Ear: Tympanic membrane and ear canal normal.     Left Ear: Tympanic membrane and ear canal normal.     Nose: Congestion present.     Mouth/Throat:     Mouth: Mucous membranes are moist.     Comments: There is white and clear exudate draining in the posterior oropharynx. Eyes:     Extraocular Movements:  Extraocular movements intact.     Conjunctiva/sclera: Conjunctivae normal.     Pupils: Pupils are equal, round, and reactive to light.  Cardiovascular:     Rate and Rhythm: Normal rate and regular rhythm.     Heart sounds: No murmur heard. Pulmonary:     Effort: No respiratory distress.     Breath sounds: No stridor. No rhonchi or rales.     Comments: There are few end expiratory wheezes heard today Musculoskeletal:     Cervical back: Neck supple.  Lymphadenopathy:     Cervical: No cervical adenopathy.  Skin:    Capillary Refill: Capillary refill takes less than 2 seconds.     Coloration: Skin is not jaundiced or pale.  Neurological:     General: No focal deficit present.     Mental Status: She is alert and oriented to person, place, and time.  Psychiatric:        Behavior: Behavior normal.      UC Treatments / Results  Labs (all labs ordered are listed, but only abnormal results are displayed) Labs Reviewed - No data to display  EKG   Radiology No results found.  Procedures Procedures (including critical care time)  Medications Ordered in UC Medications - No data to display  Initial Impression / Assessment  and Plan / UC Course  I have reviewed the triage vital signs and the nursing notes.  Pertinent labs & imaging results that were available during my care of the patient were reviewed by me and considered in my medical decision making (see chart for details).     Prednisone  is sent in for the asthma exacerbation and doxycycline  is sent in for acute sinusitis.   she states she has adequate supply of her albuterol  inhaler.   Final Clinical Impressions(s) / UC Diagnoses   Final diagnoses:  Mild intermittent asthma with (acute) exacerbation  Acute sinusitis, recurrence not specified, unspecified location     Discharge Instructions      Take doxycycline  100 mg --1 capsule 2 times daily for 7 days  Take prednisone  20 mg--2 daily for 5 days  Take  benzonatate  100 mg, 1 tab every 8 hours as needed for cough.      ED Prescriptions     Medication Sig Dispense Auth. Provider   benzonatate  (TESSALON ) 100 MG capsule Take 1 capsule (100 mg total) by mouth 3 (three) times daily as needed for cough. 21 capsule Vonna Sharlet POUR, MD   doxycycline  (VIBRAMYCIN ) 100 MG capsule Take 1 capsule (100 mg total) by mouth 2 (two) times daily for 7 days. 14 capsule Aldred Mase K, MD   predniSONE  (DELTASONE ) 20 MG tablet Take 2 tablets (40 mg total) by mouth daily with breakfast for 5 days. 10 tablet Vonna Naji Mehringer K, MD      PDMP not reviewed this encounter.    [1]  Social History Tobacco Use   Smoking status: Never   Smokeless tobacco: Never  Vaping Use   Vaping status: Never Used  Substance Use Topics   Alcohol use: No   Drug use: No     Vonna Sharlet POUR, MD 10/15/24 1954

## 2024-10-22 ENCOUNTER — Other Ambulatory Visit (HOSPITAL_COMMUNITY): Payer: Self-pay

## 2024-10-25 ENCOUNTER — Other Ambulatory Visit (INDEPENDENT_AMBULATORY_CARE_PROVIDER_SITE_OTHER): Payer: Self-pay | Admitting: Physician Assistant

## 2024-10-25 DIAGNOSIS — R7303 Prediabetes: Secondary | ICD-10-CM

## 2024-10-28 ENCOUNTER — Other Ambulatory Visit (HOSPITAL_COMMUNITY): Payer: Self-pay

## 2024-10-31 ENCOUNTER — Telehealth: Admitting: Family Medicine

## 2024-10-31 DIAGNOSIS — R399 Unspecified symptoms and signs involving the genitourinary system: Secondary | ICD-10-CM | POA: Diagnosis not present

## 2024-10-31 MED ORDER — CEPHALEXIN 500 MG PO CAPS
500.0000 mg | ORAL_CAPSULE | Freq: Two times a day (BID) | ORAL | 0 refills | Status: AC
  Filled 2024-10-31: qty 14, 7d supply, fill #0

## 2024-10-31 NOTE — Progress Notes (Signed)

## 2024-11-01 ENCOUNTER — Other Ambulatory Visit (HOSPITAL_COMMUNITY): Payer: Self-pay
